# Patient Record
Sex: Female | Born: 1940 | Hispanic: No | Marital: Married | Smoking: Never smoker
Health system: Southern US, Community
[De-identification: ages and names within clinical notes are randomized; demographics above are authoritative.]

## PROBLEM LIST (undated history)

## (undated) DIAGNOSIS — M4300 Spondylolysis, site unspecified: Secondary | ICD-10-CM

## (undated) DIAGNOSIS — G6289 Other specified polyneuropathies: Secondary | ICD-10-CM

## (undated) HISTORY — DX: Other specified polyneuropathies: G62.89

## (undated) HISTORY — PX: COLONOSCOPY: SHX174

## (undated) HISTORY — DX: Spondylolysis, site unspecified: M43.00

## (undated) HISTORY — PX: OTHER SURGICAL HISTORY: SHX169

## (undated) HISTORY — PX: ABDOMINAL HYSTERECTOMY: SHX81

---

## 2001-04-15 DIAGNOSIS — J309 Allergic rhinitis, unspecified: Secondary | ICD-10-CM | POA: Insufficient documentation

## 2002-07-20 DIAGNOSIS — E7800 Pure hypercholesterolemia, unspecified: Secondary | ICD-10-CM | POA: Insufficient documentation

## 2015-05-26 HISTORY — PX: RADICAL VAGINAL HYSTERECTOMY: SUR662

## 2017-10-26 DIAGNOSIS — M79674 Pain in right toe(s): Secondary | ICD-10-CM | POA: Insufficient documentation

## 2017-10-26 DIAGNOSIS — L84 Corns and callosities: Secondary | ICD-10-CM | POA: Insufficient documentation

## 2019-04-26 ENCOUNTER — Other Ambulatory Visit: Payer: Self-pay | Admitting: Pain Medicine

## 2019-04-26 DIAGNOSIS — M5416 Radiculopathy, lumbar region: Secondary | ICD-10-CM

## 2019-05-04 ENCOUNTER — Ambulatory Visit
Admission: RE | Admit: 2019-05-04 | Discharge: 2019-05-04 | Disposition: A | Discharge status: Home / Self Care | Payer: Medicare Other | Source: Ambulatory Visit | Attending: Pain Medicine | Admitting: Pain Medicine

## 2019-05-04 ENCOUNTER — Other Ambulatory Visit: Payer: Self-pay

## 2019-05-04 DIAGNOSIS — M5416 Radiculopathy, lumbar region: Secondary | ICD-10-CM | POA: Insufficient documentation

## 2019-05-06 ENCOUNTER — Ambulatory Visit: Payer: Medicare Other

## 2019-05-11 ENCOUNTER — Ambulatory Visit: Payer: Self-pay

## 2019-06-13 DIAGNOSIS — K219 Gastro-esophageal reflux disease without esophagitis: Secondary | ICD-10-CM | POA: Insufficient documentation

## 2019-06-22 DIAGNOSIS — S22080A Wedge compression fracture of T11-T12 vertebra, initial encounter for closed fracture: Secondary | ICD-10-CM | POA: Insufficient documentation

## 2019-07-06 ENCOUNTER — Other Ambulatory Visit: Payer: Self-pay | Admitting: Orthopedic Surgery

## 2019-07-06 DIAGNOSIS — M4856XA Collapsed vertebra, not elsewhere classified, lumbar region, initial encounter for fracture: Secondary | ICD-10-CM

## 2019-07-06 DIAGNOSIS — M4310 Spondylolisthesis, site unspecified: Secondary | ICD-10-CM

## 2019-07-06 DIAGNOSIS — M48061 Spinal stenosis, lumbar region without neurogenic claudication: Secondary | ICD-10-CM

## 2019-07-24 ENCOUNTER — Ambulatory Visit
Admission: RE | Admit: 2019-07-24 | Discharge: 2019-07-24 | Disposition: A | Discharge status: Home / Self Care | Payer: Medicare Other | Source: Ambulatory Visit | Attending: Orthopedic Surgery | Admitting: Orthopedic Surgery

## 2019-07-24 DIAGNOSIS — M48061 Spinal stenosis, lumbar region without neurogenic claudication: Secondary | ICD-10-CM

## 2019-07-24 DIAGNOSIS — M4856XA Collapsed vertebra, not elsewhere classified, lumbar region, initial encounter for fracture: Secondary | ICD-10-CM | POA: Insufficient documentation

## 2019-07-24 DIAGNOSIS — M4310 Spondylolisthesis, site unspecified: Secondary | ICD-10-CM | POA: Diagnosis present

## 2020-01-11 ENCOUNTER — Other Ambulatory Visit: Payer: Self-pay | Admitting: Student

## 2020-01-11 DIAGNOSIS — K225 Diverticulum of esophagus, acquired: Secondary | ICD-10-CM

## 2020-01-11 DIAGNOSIS — R1313 Dysphagia, pharyngeal phase: Secondary | ICD-10-CM

## 2020-01-17 ENCOUNTER — Ambulatory Visit
Admission: RE | Admit: 2020-01-17 | Discharge: 2020-01-17 | Disposition: A | Discharge status: Home / Self Care | Payer: Medicare Other | Source: Ambulatory Visit | Attending: Student | Admitting: Student

## 2020-01-17 ENCOUNTER — Other Ambulatory Visit: Payer: Self-pay

## 2020-01-17 ENCOUNTER — Other Ambulatory Visit: Payer: Self-pay | Admitting: Student

## 2020-01-17 DIAGNOSIS — R1313 Dysphagia, pharyngeal phase: Secondary | ICD-10-CM | POA: Insufficient documentation

## 2020-01-17 DIAGNOSIS — K225 Diverticulum of esophagus, acquired: Secondary | ICD-10-CM | POA: Diagnosis present

## 2020-07-24 ENCOUNTER — Other Ambulatory Visit: Payer: Self-pay | Admitting: Family Medicine

## 2020-07-24 DIAGNOSIS — Z1231 Encounter for screening mammogram for malignant neoplasm of breast: Secondary | ICD-10-CM

## 2020-08-13 ENCOUNTER — Ambulatory Visit
Admission: RE | Admit: 2020-08-13 | Discharge: 2020-08-13 | Disposition: A | Discharge status: Home / Self Care | Payer: Medicare Other | Source: Ambulatory Visit | Attending: Family Medicine | Admitting: Family Medicine

## 2020-08-13 ENCOUNTER — Other Ambulatory Visit: Payer: Self-pay

## 2020-08-13 DIAGNOSIS — Z1231 Encounter for screening mammogram for malignant neoplasm of breast: Secondary | ICD-10-CM | POA: Diagnosis not present

## 2020-11-26 ENCOUNTER — Other Ambulatory Visit: Payer: Self-pay

## 2020-11-26 ENCOUNTER — Encounter: Payer: Self-pay | Admitting: Student in an Organized Health Care Education/Training Program

## 2020-11-26 ENCOUNTER — Ambulatory Visit
Payer: Medicare Other | Attending: Student in an Organized Health Care Education/Training Program | Admitting: Student in an Organized Health Care Education/Training Program

## 2020-11-26 VITALS — BP 146/81 | HR 94 | Temp 97.0°F | Resp 14 | Ht 63.0 in | Wt 112.0 lb

## 2020-11-26 DIAGNOSIS — M48061 Spinal stenosis, lumbar region without neurogenic claudication: Secondary | ICD-10-CM | POA: Diagnosis present

## 2020-11-26 DIAGNOSIS — M51369 Other intervertebral disc degeneration, lumbar region without mention of lumbar back pain or lower extremity pain: Secondary | ICD-10-CM | POA: Insufficient documentation

## 2020-11-26 DIAGNOSIS — M5136 Other intervertebral disc degeneration, lumbar region: Secondary | ICD-10-CM | POA: Diagnosis not present

## 2020-11-26 DIAGNOSIS — M5416 Radiculopathy, lumbar region: Secondary | ICD-10-CM | POA: Diagnosis not present

## 2020-11-26 DIAGNOSIS — M47816 Spondylosis without myelopathy or radiculopathy, lumbar region: Secondary | ICD-10-CM | POA: Insufficient documentation

## 2020-11-26 DIAGNOSIS — G588 Other specified mononeuropathies: Secondary | ICD-10-CM | POA: Diagnosis present

## 2020-11-26 DIAGNOSIS — G8929 Other chronic pain: Secondary | ICD-10-CM | POA: Diagnosis present

## 2020-11-26 DIAGNOSIS — Z79891 Long term (current) use of opiate analgesic: Secondary | ICD-10-CM | POA: Diagnosis present

## 2020-11-26 DIAGNOSIS — M48062 Spinal stenosis, lumbar region with neurogenic claudication: Secondary | ICD-10-CM | POA: Diagnosis present

## 2020-11-26 DIAGNOSIS — G629 Polyneuropathy, unspecified: Secondary | ICD-10-CM | POA: Diagnosis present

## 2020-11-26 DIAGNOSIS — G894 Chronic pain syndrome: Secondary | ICD-10-CM | POA: Diagnosis not present

## 2020-11-26 DIAGNOSIS — G5703 Lesion of sciatic nerve, bilateral lower limbs: Secondary | ICD-10-CM | POA: Insufficient documentation

## 2020-11-26 DIAGNOSIS — Z79899 Other long term (current) drug therapy: Secondary | ICD-10-CM | POA: Insufficient documentation

## 2020-11-26 NOTE — Progress Notes (Signed)
Safety precautions to be maintained throughout the outpatient stay will include: orient to surroundings, keep bed in low position, maintain call bell within reach at all times, provide assistance with transfer out of bed and ambulation.  

## 2020-11-26 NOTE — Progress Notes (Signed)
Patient: Sheila Newton  Service Category: E/M  Provider: Edward Jolly, MD  DOB: [birth year]  DOS: 11/26/2020  Referring Provider: Kandyce Rud, MD  MRN: [id number]  Setting: Ambulatory outpatient  PCP: Kandyce Rud, MD  Type: New Patient  Specialty: Interventional Pain Management    Location: Office  Delivery: Face-to-face     Primary Reason(s) for Visit: Encounter for initial evaluation of one or more chronic problems (new to examiner) potentially causing chronic pain, and posing a threat to normal musculoskeletal function. (Level of risk: High) CC: Pain (Small fiber nerve neuropathy)  HPI  Sheila Newton is a 80 y.o. year old, female patient, who comes for the first time to our practice referred by Kandyce Rud, MD for our initial evaluation of her chronic pain. She has Chronic radicular lumbar pain; Lumbar spondylosis; Lumbar degenerative disc disease; Pudendal neuralgia; Encounter for long-term opiate analgesic use; Spinal stenosis, lumbar region, with neurogenic claudication; Chronic pain syndrome; Small fiber neuropathy; Piriformis syndrome of both sides; and Neuroforaminal stenosis of lumbar spine (L5/S1, severe bilateral) on their problem list. Today she comes in for evaluation of her Pain (Small fiber nerve neuropathy)  Pain Assessment: Location:   Other (Comment) Radiating:   Onset: More than a month ago Duration: Chronic pain Quality: Burning, Stabbing Severity: 3 /10 (subjective, self-reported pain score)  Effect on ADL: difficulty performing daily activities Timing: Intermittent Modifying factors: standing, medications BP: (!) 146/81  HR: 94  Onset and Duration: Gradual and Present longer than 3 months Cause of pain: Unknown Severity: No change since onset, NAS-11 at its worse: 8/10, NAS-11 at its best: 2/10, NAS-11 now: 2/10, and NAS-11 on the average: 3/10 Timing: During activity or exercise and After activity or exercise Aggravating Factors: Bowel movements and Prolonged  sitting Alleviating Factors: Lying down, Resting, Sleeping, and Standing Associated Problems: Constipation and Pain that wakes patient up Quality of Pain: Aching, Burning, Constant, Intermittent, Distressing, Feeling of constriction, Horrible, Pressure-like, Sharp, Shooting, Stabbing, Tender, Tingling, Tiring, and Uncomfortable Previous Examinations or Tests: MRI scan, Nerve block, X-rays, Nerve conduction test, Neurological evaluation, and Orthopedic evaluation Previous Treatments: Narcotic medications, Physical Therapy, and Stretching exercises  Sheila Newton is a very pleasant 80 year old female with a history of lumbar facet arthropathy, lumbar spondylosis, pudendal neuralgia, small fiber neuropathy, spinal stenosis with neurogenic claudication, piriformis syndrome who presents for chronic pain management.  She was previously established with pain management in [facility].  She moved to [place] approximately 2 years ago.  She has been seeing Dr. Marca Ancona at [facility].  She would like to transfer her pain management here as this is closer to her endograft [place] every 2 months is too long.  She is currently managed on Butrans patch 20 mcg an hour, tramadol 50 mg twice daily as needed for breakthrough pain, tramadol ER 100 mg nightly.  She has had multiple interventional procedures including lumbar epidural steroid injections, sacral lateral branch nerve blocks, as well as pudendal nerve blocks with limited response.  She has read about spinal cord stimulation but states that she is not ready to pursue this yet.  She would like to have her Butrans patch, tramadol continued at this clinic.  No issues with medication compliance or intake, or overutilization or diversion.  Of note I reviewed Dr. Kizzie Ide last note.  Historic Controlled Substance Pharmacotherapy Review   Current opioid analgesics: Butrans patch 20 mcg an hour, tramadol 50 mg twice daily, tramadol ER 100 mg  nightly  Historical Monitoring: The patient  reports no  history of drug use. List of all UDS Test(s): No results found for: MDMA, COCAINSCRNUR, PCPSCRNUR, PCPQUANT, CANNABQUANT, THCU, ETH List of other Serum/Urine Drug Screening Test(s):  No results found for: AMPHSCRSER, BARBSCRSER, BENZOSCRSER, COCAINSCRSER, COCAINSCRNUR, PCPSCRSER, PCPQUANT, THCSCRSER, THCU, CANNABQUANT, OPIATESCRSER, OXYSCRSER, PROPOXSCRSER, ETH Historical Background Evaluation: [place] PMP: PDMP reviewed during this encounter. Online review of the past 55-month period conducted.               Department of public safety, offender search: Engineer, mining Information) Non-contributory Risk Assessment Profile: Aberrant behavior: None observed or detected today Risk factors for fatal opioid overdose: None identified today  Fatal overdose hazard ratio (HR): Calculation deferred Non-fatal overdose hazard ratio (HR): Calculation deferred Risk of opioid abuse or dependence: 0.7-3.0% with doses ? 36 MME/day and 6.1-26% with doses ? 120 MME/day. Substance use disorder (SUD) risk level: See below Personal History of Substance Abuse (SUD-Substance use disorder):  Alcohol: Negative  Illegal Drugs: Negative  Rx Drugs: Negative  ORT Risk Level calculation: Low Risk  Opioid Risk Tool - 11/26/20 1422       Family History of Substance Abuse   Alcohol Negative    Illegal Drugs Negative    Rx Drugs Negative      Personal History of Substance Abuse   Alcohol Negative    Illegal Drugs Negative    Rx Drugs Negative      Age   Age between 61-45 years  No      Psychological Disease   Psychological Disease Negative    Depression Negative      Total Score   Opioid Risk Tool Scoring 0    Opioid Risk Interpretation Low Risk            ORT Scoring interpretation table:  Score <3 = Low Risk for SUD  Score between 4-7 = Moderate Risk for SUD  Score >8 = High Risk for Opioid Abuse   PHQ-2 Depression Scale:  Total score:  0  PHQ-2 Scoring interpretation table: (Score and probability of major depressive disorder)  Score 0 = No depression  Score 1 = 15.4% Probability  Score 2 = 21.1% Probability  Score 3 = 38.4% Probability  Score 4 = 45.5% Probability  Score 5 = 56.4% Probability  Score 6 = 78.6% Probability   PHQ-9 Depression Scale:  Total score: 0  PHQ-9 Scoring interpretation table:  Score 0-4 = No depression  Score 5-9 = Mild depression  Score 10-14 = Moderate depression  Score 15-19 = Moderately severe depression  Score 20-27 = Severe depression (2.4 times higher risk of SUD and 2.89 times higher risk of overuse)   Pharmacologic Plan: As per protocol, I have not taken over any controlled substance management, pending the results of ordered tests and/or consults.            Initial impression: Pending review of available data and ordered tests.  Meds   Current Outpatient Medications:    ASPIRIN 81 PO, Take by mouth daily., Disp: , Rfl:    atorvastatin (LIPITOR) 20 MG tablet, Take by mouth daily., Disp: , Rfl:    buprenorphine (BUTRANS) 20 MCG/HR PTWK, Place 1 patch onto the skin., Disp: , Rfl:    Coenzyme Q10-Vitamin E (QUNOL ULTRA COQ10 PO), Take by mouth daily., Disp: , Rfl:    Cyanocobalamin (VITAMIN B 12 PO), Take by mouth daily., Disp: , Rfl:    estradiol (VIVELLE-DOT) 0.025 MG/24HR, Place 1 patch onto the skin every 7 (seven) days., Disp: , Rfl:  lubiprostone (AMITIZA) 8 MCG capsule, Take 8 mcg by mouth 2 (two) times daily as needed for constipation., Disp: , Rfl:    Magnesium 200 MG CHEW, Chew by mouth daily., Disp: , Rfl:    Multiple Vitamins-Minerals (MULTIVITAMIN ADULT) CHEW, Chew by mouth daily., Disp: , Rfl:    pantoprazole (PROTONIX) 20 MG tablet, Take 20 mg by mouth daily., Disp: , Rfl:    Pyridoxine HCl (VITAMIN B-6 PO), Take by mouth daily., Disp: , Rfl:    traMADol (ULTRAM) 50 MG tablet, Take 50 mg by mouth 2 (two) times daily., Disp: , Rfl:    traMADol (ULTRAM-ER) 100 MG 24  hr tablet, Take 100 mg by mouth at bedtime., Disp: , Rfl:   Imaging Review   Lumbosacral Imaging: Lumbar MR wo contrast: Results for orders placed during the hospital encounter of 05/04/19  MR LUMBAR SPINE WO CONTRAST  Narrative CLINICAL DATA:  Lower pelvis and groin pain for 5-7 years  EXAM: MRI LUMBAR SPINE WITHOUT CONTRAST  TECHNIQUE: Multiplanar, multisequence MR imaging of the lumbar spine was performed. No intravenous contrast was administered.  COMPARISON:  None.  FINDINGS: Segmentation: There are 5 non-rib bearing lumbar type vertebral bodies with the last intervertebral disc space labeled as L5-S1.  Alignment: There is a grade 1 anterolisthesis of L4 on L5 measuring approximately 3 mm. There is also a grade 1 anterolisthesis of L5 on S1 measuring 6 mm.  Vertebrae: There is a chronic anterior wedge compression deformity of the T12 vertebral body with approximately 50% loss in vertebral body height. No retropulsion of fragments is seen. There is buckling of the superior cortex of T12. 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 Endplate STIR signal changes are seen at L5-S1. No acute fracture, marrow edema,or pathologic marrow infiltration.  Conus medullaris and cauda equina: Conus extends to the L1-2 level. Conus and cauda equina appear normal.  Paraspinal and other soft tissues: The paraspinal soft tissues and visualized retroperitoneal structures are unremarkable. The sacroiliac joints are intact.  Disc levels:  T12-L1: There is a broad-based disc bulge with facet arthrosis which causes mild bilateral neural foraminal narrowing.  L1-L2: There is a broad-based disc bulge with facet arthrosis which causes mild bilateral neural foraminal narrowing.  L2-L3: There is a broad-based disc bulge with facet arthrosis which causes mild-to-moderate bilateral neural foraminal narrowing. There is mild effacement anterior thecal sac.  L3-L4: There is a  broad-based disc bulge with ligamentum flavum hypertrophy and facet arthrosis which causes moderate bilateral neural foraminal narrowing. There is mild central canal narrowing.  L4-L5: There is a broad-based disc bulge with a central disc protrusion, facet arthrosis and ligamentum flavum hypertrophy. There is severe bilateral neural foraminal narrowing. The central thecal sac is effaced measuring 4 mm in AP diameter.  L5-S1: There is disc uncovering with a broad-based disc bulge and facet arthrosis. Severe bilateral neural foraminal narrowing is seen. There is mild central canal stenosis.  IMPRESSION: 1. Grade 1 anterolisthesis of L4 on L5 and L5 on S1. 2. Findings suggestive of chronic bilateral pars defects at L5-S1 with Modic type endplate changes. 3. Lumbar spine spondylosis most notable at L4-L5 with severe bilateral neural foraminal narrowing and moderate-severe central canal stenosis and at L5-S1 with severe bilateral neural foraminal narrowing and mild central canal stenosis.   Electronically Signed By: Jonna Clark M.D. On: 05/04/2019 13:52  ROS  Cardiovascular: Daily Aspirin intake Pulmonary or Respiratory: Shortness of breath Neurological: Abnormal skin sensations (Peripheral Neuropathy) Psychological-Psychiatric: No reported psychological or  psychiatric signs or symptoms such as difficulty sleeping, anxiety, depression, delusions or hallucinations (schizophrenial), mood swings (bipolar disorders) or suicidal ideations or attempts Gastrointestinal: Reflux or heatburn Genitourinary: No reported renal or genitourinary signs or symptoms such as difficulty voiding or producing urine, peeing blood, non-functioning kidney, kidney stones, difficulty emptying the bladder, difficulty controlling the flow of urine, or chronic kidney disease Hematological: No reported hematological signs or symptoms such as prolonged bleeding, low or poor functioning platelets, bruising or bleeding  easily, hereditary bleeding problems, low energy levels due to low hemoglobin or being anemic Endocrine: No reported endocrine signs or symptoms such as high or low blood sugar, rapid heart rate due to high thyroid levels, obesity or weight gain due to slow thyroid or thyroid disease Rheumatologic: Generalized muscle aches (Fibromyalgia) Musculoskeletal: Negative for myasthenia gravis, muscular dystrophy, multiple sclerosis or malignant hyperthermia Work History: Retired  Allergies  Sheila Newton is allergic to ilosone [erythromycin].   PFSH  Drug: Sheila Newton  reports no history of drug use. Alcohol:  reports no history of alcohol use. Tobacco:  reports that she has never smoked. She has never been exposed to tobacco smoke. She does not have any smokeless tobacco history on file. Medical:  has a past medical history of Small fiber polyneuropathy and Spondylolysis. Family: family history is not on file.  Past Surgical History:  Procedure Laterality Date   ABDOMINAL HYSTERECTOMY     back pain     lumbar   Active Ambulatory Problems    Diagnosis Date Noted   Chronic radicular lumbar pain 11/26/2020   Lumbar spondylosis 11/26/2020   Lumbar degenerative disc disease 11/26/2020   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   Encounter for long-term opiate analgesic use 11/26/2020   Spinal stenosis, lumbar region, 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/26/2020   Chronic pain syndrome 11/26/2020   Small fiber neuropathy 11/26/2020   Piriformis syndrome of both sides 11/26/2020   Neuroforaminal stenosis of lumbar spine (L5/S1, severe bilateral) 11/26/2020   Resolved Ambulatory Problems    Diagnosis Date Noted   No Resolved Ambulatory Problems   Past Medical History:  Diagnosis Date   Small fiber polyneuropathy    Spondylolysis    Constitutional Exam  General appearance: Well nourished, well developed, and well hydrated. In no apparent acute distress Vitals:   11/26/20 1359  BP: (!) 146/81  Pulse: 94   Resp: 14  Temp: (!) 97 F (36.1 C)  TempSrc: Temporal  SpO2: 100%  Weight: 112 lb (50.8 kg)  Height:  (1.6 m)   BMI Assessment: Estimated body mass index is 19.84 kg/m as calculated from the following:   Height as of this encounter:  (1.6 m).   Weight as of this encounter: 112 lb (50.8 kg).  BMI interpretation table: BMI level Category Range association with higher incidence of chronic pain  <18 kg/m2 Underweight   18.5-24.9 kg/m2 Ideal body weight   25-29.9 kg/m2 Overweight Increased incidence by 20%  30-34.9 kg/m2 Obese (Class I) Increased incidence by 68%  35-39.9 kg/m2 Severe obesity (Class II) Increased incidence by 136%  >40 kg/m2 Extreme obesity (Class III) Increased incidence by 254%   Patient's current BMI Ideal Body weight  Body mass index is 19.84 kg/m. Ideal body weight: 52.4 kg (115 lb 8.3 oz)   BMI Readings from Last 4 Encounters:  11/26/20 19.84 kg/m   Wt Readings from Last 4 Encounters:  11/26/20 112 lb (50.8 kg)    Psych/Mental status: Alert, oriented x 3 (person, place, & time)  Eyes: PERLA Respiratory: No evidence of acute respiratory distress  Lumbar Spine Area Exam  Skin & Axial Inspection: No masses, redness, or swelling Alignment: Symmetrical Functional ROM: Pain restricted ROM       Stability: No instability detected Muscle Tone/Strength: Functionally intact. No obvious neuro-muscular anomalies detected. Sensory (Neurological): Dermatomal pain pattern Palpation: No palpable anomalies       Provocative Tests: Hyperextension/rotation test: deferred today       Lumbar quadrant test (Kemp's test): deferred today       Lateral bending test: deferred today       Patrick's Maneuver: deferred today                   FABER* test: (+) for bilateral S-I arthralgia             S-I anterior distraction/compression test: deferred today         S-I lateral compression test: (+)   S-I arthralgia/arthropathy S-I Thigh-thrust test: deferred  today         S-I Gaenslen's test: deferred today         *(Flexion, ABduction and External Rotation) Gait & Posture Assessment  Ambulation: Unassisted Gait: Relatively normal for age and body habitus Posture: WNL  Lower Extremity Exam    Side: Right lower extremity  Side: Left lower extremity  Stability: No instability observed          Stability: No instability observed          Skin & Extremity Inspection: Skin color, temperature, and hair growth are WNL. No peripheral edema or cyanosis. No masses, redness, swelling, asymmetry, or associated skin lesions. No contractures.  Skin & Extremity Inspection: Skin color, temperature, and hair growth are WNL. No peripheral edema or cyanosis. No masses, redness, swelling, asymmetry, or associated skin lesions. No contractures.  Functional ROM: Unrestricted ROM                  Functional ROM: Unrestricted ROM                  Muscle Tone/Strength: Functionally intact. No obvious neuro-muscular anomalies detected.  Muscle Tone/Strength: Functionally intact. No obvious neuro-muscular anomalies detected.  Sensory (Neurological): Neurogenic pain pattern        Sensory (Neurological): Neurogenic pain pattern        DTR: Patellar: deferred today Achilles: deferred today Plantar: deferred today  DTR: Patellar: deferred today Achilles: deferred today Plantar: deferred today  Palpation: No palpable anomalies  Palpation: No palpable anomalies    Assessment  Primary Diagnosis & Pertinent Problem List: The primary encounter diagnosis was Chronic radicular lumbar pain. Diagnoses of Neuroforaminal stenosis of lumbar spine (L5/S1, severe bilateral), Lumbar spondylosis (severe, L4/5), Lumbar degenerative disc disease, Pudendal neuralgia, Encounter for long-term opiate analgesic use, Spinal stenosis, lumbar region, with neurogenic claudication, Small fiber neuropathy, Piriformis syndrome of both sides, and Chronic pain syndrome were also pertinent to this  visit.  Visit Diagnosis (New problems to examiner): 1. Chronic radicular lumbar pain   2. Neuroforaminal stenosis of lumbar spine (L5/S1, severe bilateral)   3. Lumbar spondylosis (severe, L4/5)   4. Lumbar degenerative disc disease   5. Pudendal neuralgia   6. Encounter for long-term opiate analgesic use   7. Spinal stenosis, lumbar region, with neurogenic claudication   8. Small fiber neuropathy   9. Piriformis syndrome of both sides   10. Chronic pain syndrome    Plan of Care (Initial workup plan)  Note: Sheila Newton was reminded  that as per protocol, today's visit has been an evaluation only. We have not taken over the patient's controlled substance management.  General Recommendations: The pain condition that the patient suffers from is best treated with a multidisciplinary approach that involves an increase in physical activity to prevent de-conditioning and worsening of the pain cycle, as well as psychological counseling (formal and/or informal) to address the co-morbid psychological affects of pain. Treatment will often involve judicious use of pain medications and interventional procedures to decrease the pain, allowing the patient to participate in the physical activity that will ultimately produce long-lasting pain reductions. The goal of the multidisciplinary approach is to return the patient to a higher level of overall function and to restore their ability to perform activities of daily living.   1.  Urine toxicology screen which is customary for new patients be considered for chronic opioid therapy 2.  So long as UDS is appropriate, can take over patient's chronic opioid regimen of Butrans patch 20 mcg an hour, tramadol 50 mg 3 times daily as needed for breakthrough pain, tramadol ER 100 mg nightly.  She does find benefit with this regimen and it helps her function as well. 3.  We discussed spinal cord stimulation 4.  We also discussed piriformis injection for history of piriformis  syndrome.  We will discuss further when patient follows up for medication refill as patient was read about this further.   Lab Orders  Compliance Drug Analysis, Ur    Interventional management options: Sheila Newton was informed that there is no guarantee that she would be a candidate for interventional therapies. The decision will be based on the results of diagnostic studies, as well as Sheila Newton risk profile.  Procedure(s) under consideration:  Piriformis injection Sacroiliac joint injection Spinal cord stimulator trial    Provider-requested follow-up: Return in about 6 weeks (around 01/07/2021) for Medication Management, in person.  I spent a total of 60 minutes reviewing chart data, face-to-face evaluation with the patient, counseling and coordination of care as detailed above.   No future appointments.  Note by: Edward Jolly, MD Date: 11/26/2020; Time: 2:59 PM

## 2020-12-04 LAB — COMPLIANCE DRUG ANALYSIS, UR

## 2021-01-07 ENCOUNTER — Encounter: Payer: Self-pay | Admitting: Student in an Organized Health Care Education/Training Program

## 2021-01-07 ENCOUNTER — Ambulatory Visit
Payer: Medicare Other | Attending: Student in an Organized Health Care Education/Training Program | Admitting: Student in an Organized Health Care Education/Training Program

## 2021-01-07 ENCOUNTER — Other Ambulatory Visit: Payer: Self-pay

## 2021-01-07 VITALS — BP 136/69 | HR 87 | Temp 96.9°F | Resp 16 | Ht 63.0 in | Wt 112.0 lb

## 2021-01-07 DIAGNOSIS — M533 Sacrococcygeal disorders, not elsewhere classified: Secondary | ICD-10-CM | POA: Insufficient documentation

## 2021-01-07 DIAGNOSIS — G588 Other specified mononeuropathies: Secondary | ICD-10-CM | POA: Diagnosis present

## 2021-01-07 DIAGNOSIS — M5416 Radiculopathy, lumbar region: Secondary | ICD-10-CM | POA: Insufficient documentation

## 2021-01-07 DIAGNOSIS — G8929 Other chronic pain: Secondary | ICD-10-CM | POA: Insufficient documentation

## 2021-01-07 DIAGNOSIS — M48062 Spinal stenosis, lumbar region with neurogenic claudication: Secondary | ICD-10-CM | POA: Insufficient documentation

## 2021-01-07 DIAGNOSIS — G629 Polyneuropathy, unspecified: Secondary | ICD-10-CM | POA: Diagnosis present

## 2021-01-07 DIAGNOSIS — M47816 Spondylosis without myelopathy or radiculopathy, lumbar region: Secondary | ICD-10-CM | POA: Insufficient documentation

## 2021-01-07 DIAGNOSIS — Z79891 Long term (current) use of opiate analgesic: Secondary | ICD-10-CM

## 2021-01-07 DIAGNOSIS — Z0289 Encounter for other administrative examinations: Secondary | ICD-10-CM | POA: Insufficient documentation

## 2021-01-07 DIAGNOSIS — M47818 Spondylosis without myelopathy or radiculopathy, sacral and sacrococcygeal region: Secondary | ICD-10-CM | POA: Insufficient documentation

## 2021-01-07 DIAGNOSIS — M5136 Other intervertebral disc degeneration, lumbar region: Secondary | ICD-10-CM | POA: Diagnosis present

## 2021-01-07 DIAGNOSIS — G5703 Lesion of sciatic nerve, bilateral lower limbs: Secondary | ICD-10-CM | POA: Insufficient documentation

## 2021-01-07 DIAGNOSIS — M48061 Spinal stenosis, lumbar region without neurogenic claudication: Secondary | ICD-10-CM | POA: Insufficient documentation

## 2021-01-07 DIAGNOSIS — G894 Chronic pain syndrome: Secondary | ICD-10-CM | POA: Diagnosis present

## 2021-01-07 MED ORDER — TRAMADOL HCL ER 100 MG PO TB24
100.0000 mg | ORAL_TABLET | Freq: Every day | ORAL | 1 refills | Status: DC
Start: 2021-01-25 — End: 2021-03-18

## 2021-01-07 MED ORDER — BUPRENORPHINE 20 MCG/HR TD PTWK
1.0000 | MEDICATED_PATCH | TRANSDERMAL | 1 refills | Status: DC
Start: 2021-01-31 — End: 2021-03-18

## 2021-01-07 MED ORDER — TRAMADOL HCL 50 MG PO TABS
50.0000 mg | ORAL_TABLET | Freq: Two times a day (BID) | ORAL | 1 refills | Status: DC
Start: 2021-01-18 — End: 2021-03-18

## 2021-01-07 NOTE — Progress Notes (Signed)
PROVIDER NOTE: Information contained herein reflects review and annotations entered in association with encounter. Interpretation of such information and data should be left to medically-trained personnel. Information provided to patient can be located elsewhere in the medical record under "Patient Instructions". Document created using STT-dictation technology, any transcriptional errors that may result from process are unintentional.    Patient: Sheila Newton  Service Category: E/M  Provider: Gillis Santa, MD  DOB: 08/17/1940  DOS: 01/07/2021  Specialty: Interventional Pain Management  MRN: [id number]  Setting: Ambulatory outpatient  PCP: Derinda Late, MD  Type: Established Patient    Referring Provider: Derinda Late, MD  Location: Office  Delivery: Face-to-face     Primary Reason(s) for Visit: Encounter for evaluation before starting new chronic pain management plan of care (Level of risk: moderate) CC: Peripheral Neuropathy (Primary - perineum)  HPI  Sheila Newton is a 80 y.o. year old, female patient, who comes today for a follow-up evaluation to review the test results and decide on a treatment plan. She has Chronic radicular lumbar pain; Lumbar spondylosis; Lumbar degenerative disc disease; Pudendal neuralgia; Encounter for long-term opiate analgesic use; Spinal stenosis, lumbar region, with neurogenic claudication; Chronic pain syndrome; Small fiber neuropathy; Piriformis syndrome of both sides; Neuroforaminal stenosis of lumbar spine (L5/S1, severe bilateral); Pain management contract signed; Sacroiliac joint pain; and SI joint arthritis on their problem list. Her primarily concern today is the Peripheral Neuropathy (Primary - perineum)  Pain Assessment: Location:    (perineum) Radiating:   Onset: More than a month ago Duration: Chronic pain Quality: Burning (descriptors depend on how long patient has been sitting) Severity: 2 /10 (subjective, self-reported pain score)  Effect on ADL:  difficulty performing daily activities Timing: Constant Modifying factors: standing, meds BP: 136/69  HR: 87  Sheila Newton comes in today for a follow-up visit after her initial evaluation on 11/26/2020.   And presents today for her second patient visit.  Today I will be taking over her pain management that she will be signing her pain contract here.  I will take over her chronic pain regimen as below which is the same regimen that she was on at [facility] with Dr. Darral Dash.  Today we discussed diagnostic bilateral piriformis injection and sacroiliac joint injection as we had discussed at her initial clinic visit.  Patient has buttock pain, lower back pain, hip pain.  She has done physical therapy in the past and continues to do exercises and stretches at home regularly.   HPI from initial clinic note 11/26/20: Sheila Newton is a very pleasant 80 year old female with a history of lumbar facet arthropathy, lumbar spondylosis, pudendal neuralgia, small fiber neuropathy, spinal stenosis with neurogenic claudication, piriformis syndrome who presents for chronic pain management.  She was previously established with pain management in [facility].  She moved to [place] approximately 2 years ago.  She has been seeing Dr. Darral Dash at [facility].  She would like to transfer her pain management here as this is closer to her endograft [place] every 2 months is too long.  She is currently managed on Butrans patch 20 mcg an hour, tramadol 50 mg twice daily as needed for breakthrough pain, tramadol ER 100 mg nightly.  She has had multiple interventional procedures including lumbar epidural steroid injections, sacral lateral branch nerve blocks, as well as pudendal nerve blocks with limited response.  She has read about spinal cord stimulation but states that she is not ready to pursue this yet.  She would like to  have her Butrans patch, tramadol continued at this clinic.  No issues with  medication compliance or intake, or overutilization or diversion.   Assessment and plan:  1.  Urine toxicology screen which is customary for new patients be considered for chronic opioid therapy 2.  So long as UDS is appropriate, can take over patient's chronic opioid regimen of Butrans patch 20 mcg an hour, tramadol 50 mg 3 times daily as needed for breakthrough pain, tramadol ER 100 mg nightly.  She does find benefit with this regimen and it helps her function as well. 3.  We discussed spinal cord stimulation 4.  We also discussed piriformis injection for history of piriformis syndrome.  We will discuss further when patient follows up for medication refill as patient was read about this further.  Controlled Substance Pharmacotherapy Assessment REMS (Risk Evaluation and Mitigation Strategy)  Analgesic: Butrans patch 20 mcg an hour, tramadol 50 mg twice daily as needed for breakthrough pain, tramadol ER 100 mg nightly  Pill Count: None expected due to no prior prescriptions written by our practice. [name], RN  01/07/2021 10:30 AM  Sign when Signing Visit Safety precautions to be maintained throughout the outpatient stay will include: orient to surroundings, keep bed in low position, maintain call bell within reach at all times, provide assistance with transfer out of bed and ambulation.      Pharmacokinetics: Liberation and absorption (onset of action): WNL Distribution (time to peak effect): WNL Metabolism and excretion (duration of action): WNL         Pharmacodynamics: Desired effects: Analgesia: Sheila Newton reports >50% benefit. Functional ability: Patient reports that medication allows her to accomplish basic ADLs Clinically meaningful improvement in function (CMIF): Sustained CMIF goals met Perceived effectiveness: Described as relatively effective, allowing for increase in activities of daily living (ADL) Undesirable effects: Side-effects or Adverse reactions: None  reported Monitoring: [place] PMP: PDMP not reviewed this encounter. Online review of the past 58-monthperiod previously conducted. Not applicable at this point since we have not taken over the patient's medication management yet. List of other Serum/Urine Drug Screening Test(s):  No results found for: AMPHSCRSER, BARBSCRSER, BENZOSCRSER, COCAINSCRSER, COCAINSCRNUR, PCPSCRSER, THCSCRSER, THCU, CANNABQUANT, [place] E[place]ford[place] of all UDS test(s) done:  Lab Results  Component Value Date   SUMMARY Note 11/26/2020   Last UDS on record: Summary  Date Value Ref Range Status  11/26/2020 Note  Final    Comment:    ==================================================================== Compliance Drug Analysis, Ur ==================================================================== Test                             Result       Flag       Units  Drug Present and Declared for Prescription Verification   Buprenorphine                  23           EXPECTED   ng/mg creat   Norbuprenorphine               22           EXPECTED   ng/mg creat    Source of buprenorphine is a scheduled prescription medication.    Norbuprenorphine is an expected metabolite of buprenorphine.    Tramadol                       >3165  EXPECTED   ng/mg creat   O-Desmethyltramadol            >3165        EXPECTED   ng/mg creat   N-Desmethyltramadol            >3165        EXPECTED   ng/mg creat    Source of tramadol is a prescription medication. O-desmethyltramadol    and N-desmethyltramadol are expected metabolites of tramadol.  Drug Absent but Declared for Prescription Verification   Salicylate                     Not Detected UNEXPECTED    Aspirin, as indicated in the declared medication list, is not always    detected even when used as directed.  ==================================================================== Test                      Result    Flag   Units      Ref Range   Creatinine               158              mg/dL      >=20 ==================================================================== Declared Medications:  The flagging and interpretation on this report are based on the  following declared medications.  Unexpected results may arise from  inaccuracies in the declared medications.   **Note: The testing scope of this panel includes these medications:   Tramadol   **Note: The testing scope of this panel does not include small to  moderate amounts of these reported medications:   Aspirin  Buprenorphine Patch (BuTrans)   **Note: The testing scope of this panel does not include the  following reported medications:   Atorvastatin  Cyanocobalamin  Estradiol  Lubiprostone  Magnesium  Multivitamin  Pantoprazole  Pyridoxine  Ubiquinone (CoQ10)  Vitamin E ==================================================================== For clinical consultation, please call [phone]. ====================================================================    UDS interpretation: No unexpected findings.          Medication Assessment Form: Patient introduced to form today Treatment compliance: Treatment may start today if patient agrees with proposed plan. Evaluation of compliance is not applicable at this point Risk Assessment Profile: Aberrant behavior: See initial evaluations. None observed or detected today Comorbid factors increasing risk of overdose: See initial evaluation. No additional risks detected today Opioid risk tool (ORT):  Opioid Risk  01/07/2021  Alcohol 0  Illegal Drugs 0  Rx Drugs 0  Alcohol 0  Illegal Drugs 0  Rx Drugs 0  Age between 16-45 years  0  Psychological Disease 0  Depression 0  Opioid Risk Tool Scoring 0  Opioid Risk Interpretation Low Risk    ORT Scoring interpretation table:  Score <3 = Low Risk for SUD  Score between 4-7 = Moderate Risk for SUD  Score >8 = High Risk for Opioid Abuse   Risk of substance use disorder  (SUD): Low  Risk Mitigation Strategies:  Patient opioid safety counseling: Completed today. Counseling provided to patient as per "Patient Counseling Document". Document signed by patient, attesting to counseling and understanding Patient-Prescriber Agreement (PPA): Obtained today.  Controlled substance notification to other providers: Written and sent today.  Pharmacologic Plan: Today we may be taking over the patient's pharmacological regimen. See below.             Laboratory Chemistry Profile   Renal No results found for: BUN, CREATININE, LABCREA, BCR, GFR, GFRAA, GFRNONAA, SPECGRAV,  PHUR, PROTEINUR   Electrolytes No results found for: NA, K, CL, CALCIUM, MG, PHOS   Hepatic No results found for: AST, ALT, ALBUMIN, ALKPHOS, AMYLASE, LIPASE, AMMONIA   ID No results found for: LYMEIGGIGMAB, HIV, SARSCOV2NAA, STAPHAUREUS, MRSAPCR, HCVAB, PREGTESTUR, RMSFIGG, QFVRPH1IGG, QFVRPH2IGG, LYMEIGGIGMAB   Bone No results found for: VD25OH, [id number], DX4128NO6, VE7209OB0, 25OHVITD1, 25OHVITD2, 25OHVITD3, TESTOFREE, TESTOSTERONE   Endocrine No results found for: GLUCOSE, GLUCOSEU, HGBA1C, TSH, FREET4, TESTOFREE, TESTOSTERONE, SHBG, ESTRADIOL, ESTRADIOLPCT, ESTRADIOLFRE, LABPREG, ACTH, CRTSLPL, UCORFRPERLTR, UCORFRPERDAY, CORTISOLBASE, LABPREG   Neuropathy No results found for: VITAMINB12, FOLATE, HGBA1C, HIV   CNS No results found for: COLORCSF, APPEARCSF, RBCCOUNTCSF, WBCCSF, POLYSCSF, LYMPHSCSF, EOSCSF, PROTEINCSF, GLUCCSF, JCVIRUS, CSFOLI, IGGCSF, LABACHR, ACETBL, LABACHR, ACETBL   Inflammation (CRP: Acute  ESR: Chronic) No results found for: CRP, ESRSEDRATE, LATICACIDVEN   Rheumatology No results found for: RF, ANA, LABURIC, URICUR, LYMEIGGIGMAB, LYMEABIGMQN, HLAB27   Coagulation No results found for: INR, LABPROT, APTT, PLT, DDIMER, LABHEMA, VITAMINK1, AT3   Cardiovascular No results found for: BNP, CKTOTAL, CKMB, TROPONINI, HGB, HCT, LABVMA, EPIRU, EPINEPH24HUR, NOREPRU,  NOREPI24HUR, DOPARU, DOPAM24HRUR   Screening No results found for: SARSCOV2NAA, COVIDSOURCE, STAPHAUREUS, MRSAPCR, HCVAB, HIV, PREGTESTUR   Cancer No results found for: CEA, CA125, LABCA2   Allergens No results found for: ALMOND, APPLE, ASPARAGUS, AVOCADO, BANANA, BARLEY, BASIL, BAYLEAF, GREENBEAN, LIMABEAN, WHITEBEAN, BEEFIGE, REDBEET, BLUEBERRY, BROCCOLI, CABBAGE, MELON, CARROT, CASEIN, CASHEWNUT, CAULIFLOWER, CELERY     Note: Lab results reviewed.  Recent Diagnostic Imaging Review  Lumbosacral Imaging: Lumbar MR wo contrast: Results for orders placed during the hospital encounter of 05/04/19  MR LUMBAR SPINE WO CONTRAST  Narrative CLINICAL DATA:  Lower pelvis and groin pain for 5-7 years  EXAM: MRI LUMBAR SPINE WITHOUT CONTRAST  TECHNIQUE: Multiplanar, multisequence MR imaging of the lumbar spine was performed. No intravenous contrast was administered.  COMPARISON:  None.  FINDINGS: Segmentation: There are 5 non-rib bearing lumbar type vertebral bodies with the last intervertebral disc space labeled as L5-S1.  Alignment: There is a grade 1 anterolisthesis of L4 on L5 measuring approximately 3 mm. There is also a grade 1 anterolisthesis of L5 on S1 measuring 6 mm.  Vertebrae: There is a chronic anterior wedge compression deformity of the T12 vertebral body with approximately 50% loss in vertebral body height. No retropulsion of fragments is seen. There is buckling of the superior cortex of T12. 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 Endplate STIR signal changes are seen at L5-S1. No acute fracture, marrow edema,or pathologic marrow infiltration.  Conus medullaris and cauda equina: Conus extends to the L1-2 level. Conus and cauda equina appear normal.  Paraspinal and other soft tissues: The paraspinal soft tissues and visualized retroperitoneal structures are unremarkable. The sacroiliac joints are intact.  Disc levels:  T12-L1: There is a  broad-based disc bulge with facet arthrosis which causes mild bilateral neural foraminal narrowing.  L1-L2: There is a broad-based disc bulge with facet arthrosis which causes mild bilateral neural foraminal narrowing.  L2-L3: There is a broad-based disc bulge with facet arthrosis which causes mild-to-moderate bilateral neural foraminal narrowing. There is mild effacement anterior thecal sac.  L3-L4: There is a broad-based disc bulge with ligamentum flavum hypertrophy and facet arthrosis which causes moderate bilateral neural foraminal narrowing. There is mild central canal narrowing.  L4-L5: There is a broad-based disc bulge with a central disc protrusion, facet arthrosis and ligamentum flavum hypertrophy. There is severe bilateral neural foraminal narrowing. The central thecal sac is effaced measuring 4 mm in AP diameter.  L5-S1: There is disc uncovering with a broad-based disc bulge and facet arthrosis. Severe bilateral neural foraminal narrowing is seen. There is mild central canal stenosis.  IMPRESSION: 1. Grade 1 anterolisthesis of L4 on L5 and L5 on S1. 2. Findings suggestive of chronic bilateral pars defects at L5-S1 with Modic type endplate changes. 3. Lumbar spine spondylosis most notable at L4-L5 with severe bilateral neural foraminal narrowing and moderate-severe central canal stenosis and at L5-S1 with severe bilateral neural foraminal narrowing and mild central canal stenosis.   Electronically Signed By: Prudencio Pair M.D. On: 05/04/2019 13:52   Complexity Note: Imaging results reviewed. Results shared with Ms. Turi, using Layman's terms.                         Meds   Current Outpatient Medications:    ASPIRIN 81 PO, Take by mouth daily., Disp: , Rfl:    atorvastatin (LIPITOR) 20 MG tablet, Take by mouth daily., Disp: , Rfl:    Coenzyme Q10-Vitamin E (QUNOL ULTRA COQ10 PO), Take by mouth daily., Disp: , Rfl:    Cyanocobalamin (VITAMIN B 12 PO), Take by  mouth daily., Disp: , Rfl:    estradiol (VIVELLE-DOT) 0.025 MG/24HR, Place 1 patch onto the skin every 7 (seven) days., Disp: , Rfl:    lubiprostone (AMITIZA) 8 MCG capsule, Take 8 mcg by mouth 2 (two) times daily as needed for constipation., Disp: , Rfl:    Magnesium 200 MG CHEW, Chew by mouth daily., Disp: , Rfl:    Multiple Vitamins-Minerals (MULTIVITAMIN ADULT) CHEW, Chew by mouth daily., Disp: , Rfl:    pantoprazole (PROTONIX) 20 MG tablet, Take 20 mg by mouth daily., Disp: , Rfl:    Pyridoxine HCl (VITAMIN B-6 PO), Take by mouth daily., Disp: , Rfl:    [START ON 01/31/2021] buprenorphine (BUTRANS) 20 MCG/HR PTWK, Place 1 patch onto the skin once a week., Disp: 4 patch, Rfl: 1   [START ON 01/18/2021] traMADol (ULTRAM) 50 MG tablet, Take 1 tablet (50 mg total) by mouth 2 (two) times daily., Disp: 60 tablet, Rfl: 1   [START ON 01/25/2021] traMADol (ULTRAM-ER) 100 MG 24 hr tablet, Take 1 tablet (100 mg total) by mouth at bedtime., Disp: 30 tablet, Rfl: 1  ROS  Constitutional: Denies any fever or chills Gastrointestinal: No reported hemesis, hematochezia, vomiting, or acute GI distress Musculoskeletal: Denies any acute onset joint swelling, redness, loss of ROM, or weakness Neurological: No reported episodes of acute onset apraxia, aphasia, dysarthria, agnosia, amnesia, paralysis, loss of coordination, or loss of consciousness  Allergies  Ms. Mcelmurry is allergic to ilosone [erythromycin].  [place]  Drug: Ms. Marasco  reports no history of drug use. Alcohol:  reports no history of alcohol use. Tobacco:  reports that she has never smoked. She has never been exposed to tobacco smoke. She has never used smokeless tobacco. Medical:  has a past medical history of Small fiber polyneuropathy and Spondylolysis. Surgical: Ms. Dobratz  has a past surgical history that includes back pain and Abdominal hysterectomy. Family: family history is not on file.  Constitutional Exam  General appearance: Well nourished,  well developed, and well hydrated. In no apparent acute distress Vitals:   01/07/21 1018  BP: 136/69  Pulse: 87  Resp: 16  Temp: (!) 96.9 F (36.1 C)  TempSrc: Temporal  SpO2: 100%  Weight: 112 lb (50.8 kg)  Height: _0  (1.6 m)   BMI Assessment: Estimated body mass index is 19.84 kg/m  as calculated from the following:   Height as of this encounter: _0  (1.6 m).   Weight as of this encounter: 112 lb (50.8 kg).  BMI interpretation table: BMI level Category Range association with higher incidence of chronic pain  <18 kg/m2 Underweight   18.5-24.9 kg/m2 Ideal body weight   25-29.9 kg/m2 Overweight Increased incidence by 20%  30-34.9 kg/m2 Obese (Class I) Increased incidence by 68%  35-39.9 kg/m2 Severe obesity (Class II) Increased incidence by 136%  >40 kg/m2 Extreme obesity (Class III) Increased incidence by 254%   Patient's current BMI Ideal Body weight  Body mass index is 19.84 kg/m. Ideal body weight: 52.4 kg (115 lb 8.3 oz)   BMI Readings from Last 4 Encounters:  01/07/21 19.84 kg/m  11/26/20 19.84 kg/m   Wt Readings from Last 4 Encounters:  01/07/21 112 lb (50.8 kg)  11/26/20 112 lb (50.8 kg)    Psych/Mental status: Alert, oriented x 3 (person, place, & time)       Eyes: PERLA Respiratory: No evidence of acute respiratory distress    Lumbar Spine Area Exam  Skin & Axial Inspection: No masses, redness, or swelling Alignment: Symmetrical Functional ROM: Pain restricted ROM       Stability: No instability detected Muscle Tone/Strength: Functionally intact. No obvious neuro-muscular anomalies detected. Sensory (Neurological): Dermatomal pain pattern Palpation: No palpable anomalies       Provocative Tests: Hyperextension/rotation test: deferred today       Lumbar quadrant test (Kemp's test): deferred today       Lateral bending test: deferred today       Patrick's Maneuver: deferred today                   FABER* test: (+) for bilateral S-I arthralgia              S-I anterior distraction/compression test: deferred today         S-I lateral compression test: (+)   S-I arthralgia/arthropathy S-I Thigh-thrust test: deferred today         S-I Gaenslen's test: deferred today         *(Flexion, ABduction and External Rotation) Gait & Posture Assessment  Ambulation: Unassisted Gait: Relatively normal for age and body habitus Posture: WNL  Lower Extremity Exam      Side: Right lower extremity   Side: Left lower extremity  Stability: No instability observed           Stability: No instability observed          Skin & Extremity Inspection: Skin color, temperature, and hair growth are WNL. No peripheral edema or cyanosis. No masses, redness, swelling, asymmetry, or associated skin lesions. No contractures.   Skin & Extremity Inspection: Skin color, temperature, and hair growth are WNL. No peripheral edema or cyanosis. No masses, redness, swelling, asymmetry, or associated skin lesions. No contractures.  Functional ROM: Unrestricted ROM                   Functional ROM: Unrestricted ROM                  Muscle Tone/Strength: Functionally intact. No obvious neuro-muscular anomalies detected.   Muscle Tone/Strength: Functionally intact. No obvious neuro-muscular anomalies detected.  Sensory (Neurological): Neurogenic pain pattern         Sensory (Neurological): Neurogenic pain pattern        DTR: Patellar: deferred today Achilles: deferred today Plantar: deferred today   DTR: Patellar: deferred today  Achilles: deferred today Plantar: deferred today  Palpation: No palpable anomalies   Palpation: No palpable anomalies     Assessment & Plan  Primary Diagnosis & Pertinent Problem List: The primary encounter diagnosis was Chronic radicular lumbar pain. Diagnoses of Neuroforaminal stenosis of lumbar spine (L5/S1, severe bilateral), Lumbar spondylosis (severe, L4/5), Lumbar degenerative disc disease, Pudendal neuralgia, Encounter for long-term opiate  analgesic use, Spinal stenosis, lumbar region, with neurogenic claudication, Small fiber neuropathy, Pain management contract signed, Piriformis syndrome of both sides, Chronic pain syndrome, Sacroiliac joint pain, and SI joint arthritis were also pertinent to this visit.  Visit Diagnosis: 1. Chronic radicular lumbar pain   2. Neuroforaminal stenosis of lumbar spine (L5/S1, severe bilateral)   3. Lumbar spondylosis (severe, L4/5)   4. Lumbar degenerative disc disease   5. Pudendal neuralgia   6. Encounter for long-term opiate analgesic use   7. Spinal stenosis, lumbar region, with neurogenic claudication   8. Small fiber neuropathy   9. Pain management contract signed   10. Piriformis syndrome of both sides   11. Chronic pain syndrome   12. Sacroiliac joint pain   13. SI joint arthritis    Problems updated and reviewed during this visit: Problem  Pain Management Contract Signed  Sacroiliac Joint Pain  Si Joint Arthritis    Plan of Care  Pharmacotherapy (Medications Ordered): Meds ordered this encounter  Medications   traMADol (ULTRAM) 50 MG tablet    Sig: Take 1 tablet (50 mg total) by mouth 2 (two) times daily.    Dispense:  60 tablet    Refill:  1   traMADol (ULTRAM-ER) 100 MG 24 hr tablet    Sig: Take 1 tablet (100 mg total) by mouth at bedtime.    Dispense:  30 tablet    Refill:  1   buprenorphine (BUTRANS) 20 MCG/HR PTWK    Sig: Place 1 patch onto the skin once a week.    Dispense:  4 patch    Refill:  1    For chronic pain syndrome   Procedure Orders         TRIGGER POINT INJECTION         SACROILIAC JOINT INJECTION     Plan for bilateral piriformis, bilateral sacroiliac injection pain related to piriformis syndrome, SI joint arthritis. Continue with home physical therapy exercises and stretches.   Pharmacological management options:  Opioid Analgesics: We'll take over management today. See above orders Membrane stabilizer: Tried and failed Muscle relaxant:  We have discussed the possibility of a trial NSAID: Trial discussed. Other analgesic(s): To be determined at a later time    Provider-requested follow-up: Return in about 1 week (around 01/14/2021) for B/L SI-J + B/L  piriformis, without sedation. Recent Visits Date Type Provider Dept  11/26/20 Office Visit Gillis Santa, MD [facility]  Showing recent visits within past 90 days and meeting all other requirements Today's Visits Date Type Provider Dept  01/07/21 Office Visit Gillis Santa, MD [facility]  Showing today's visits and meeting all other requirements Future Appointments Date Type Provider Dept  01/15/21 Appointment Gillis Santa, MD [facility]  03/18/21 Appointment Gillis Santa, MD [facility]  Showing future appointments within next 90 days and meeting all other requirements Primary Care Physician: Derinda Late, MD Note by: Gillis Santa, MD Date: 01/07/2021; Time: 11:16 AM

## 2021-01-07 NOTE — Patient Instructions (Signed)
GENERAL RISKS AND COMPLICATIONS  What are the risk, side effects and possible complications? Generally speaking, most procedures are safe.  However, with any procedure there are risks, side effects, and the possibility of complications.  The risks and complications are dependent upon the sites that are lesioned, or the type of nerve block to be performed.  The closer the procedure is to the spine, the more serious the risks are.  Great care is taken when placing the radio frequency needles, block needles or lesioning probes, but sometimes complications can occur. Infection: Any time there is an injection through the skin, there is a risk of infection.  This is why sterile conditions are used for these blocks.  There are four possible types of infection. Localized skin infection. Central Nervous System Infection-This can be in the form of Meningitis, which can be deadly. Epidural Infections-This can be in the form of an epidural abscess, which can cause pressure inside of the spine, causing compression of the spinal cord with subsequent paralysis. This would require an emergency surgery to decompress, and there are no guarantees that the patient would recover from the paralysis. Discitis-This is an infection of the intervertebral discs.  It occurs in about 1% of discography procedures.  It is difficult to treat and it may lead to surgery.        2. Pain: the needles have to go through skin and soft tissues, will cause soreness.       3. Damage to internal structures:  The nerves to be lesioned may be near blood vessels or    other nerves which can be potentially damaged.       4. Bleeding: Bleeding is more common if the patient is taking blood thinners such as  aspirin, Coumadin, Ticiid, Plavix, etc., or if he/she have some genetic predisposition  such as hemophilia. Bleeding into the spinal canal can cause compression of the spinal  cord with subsequent paralysis.  This would require an emergency  surgery to  decompress and there are no guarantees that the patient would recover from the  paralysis.       5. Pneumothorax:  Puncturing of a lung is a possibility, every time a needle is introduced in  the area of the chest or upper back.  Pneumothorax refers to free air around the  collapsed lung(s), inside of the thoracic cavity (chest cavity).  Another two possible  complications related to a similar event would include: Hemothorax and Chylothorax.   These are variations of the Pneumothorax, where instead of air around the collapsed  lung(s), you may have blood or chyle, respectively.       6. Spinal headaches: They may occur with any procedures in the area of the spine.       7. Persistent CSF (Cerebro-Spinal Fluid) leakage: This is a rare problem, but may occur  with prolonged intrathecal or epidural catheters either due to the formation of a fistulous  track or a dural tear.       8. Nerve damage: By working so close to the spinal cord, there is always a possibility of  nerve damage, which could be as serious as a permanent spinal cord injury with  paralysis.       9. Death:  Although rare, severe deadly allergic reactions known as "Anaphylactic  reaction" can occur to any of the medications used.      10. Worsening of the symptoms:  We can always make thing worse.  What are the chances   of something like this happening? Chances of any of this occuring are extremely low.  By statistics, you have more of a chance of getting killed in a motor vehicle accident: while driving to the hospital than any of the above occurring .  Nevertheless, you should be aware that they are possibilities.  In general, it is similar to taking a shower.  Everybody knows that you can slip, hit your head and get killed.  Does that mean that you should not shower again?  Nevertheless always keep in mind that statistics do not mean anything if you happen to be on the wrong side of them.  Even if a procedure has a 1 (one) in a  1,000,000 (million) chance of going wrong, it you happen to be that one..Also, keep in mind that by statistics, you have more of a chance of having something go wrong when taking medications.  Who should not have this procedure? If you are on a blood thinning medication (e.g. Coumadin, Plavix, see list of "Blood Thinners"), or if you have an active infection going on, you should not have the procedure.  If you are taking any blood thinners, please inform your physician.  How should I prepare for this procedure? Do not eat or drink anything at least six hours prior to the procedure. Bring a driver with you .  It cannot be a taxi. Come accompanied by an adult that can drive you back, and that is strong enough to help you if your legs get weak or numb from the local anesthetic. Take all of your medicines the morning of the procedure with just enough water to swallow them. If you have diabetes, make sure that you are scheduled to have your procedure done first thing in the morning, whenever possible. If you have diabetes, take only half of your insulin dose and notify our nurse that you have done so as soon as you arrive at the clinic. If you are diabetic, but only take blood sugar pills (oral hypoglycemic), then do not take them on the morning of your procedure.  You may take them after you have had the procedure. Do not take aspirin or any aspirin-containing medications, at least eleven (11) days prior to the procedure.  They may prolong bleeding. Wear loose fitting clothing that may be easy to take off and that you would not mind if it got stained with Betadine or blood. Do not wear any jewelry or perfume Remove any nail coloring.  It will interfere with some of our monitoring equipment.  NOTE: Remember that this is not meant to be interpreted as a complete list of all possible complications.  Unforeseen problems may occur.  BLOOD THINNERS The following drugs contain aspirin or other products,  which can cause increased bleeding during surgery and should not be taken for 2 weeks prior to and 1 week after surgery.  If you should need take something for relief of minor pain, you may take acetaminophen which is found in Tylenol,m Datril, Anacin-3 and Panadol. It is not blood thinner. The products listed below are.  Do not take any of the products listed below in addition to any listed on your instruction sheet.  A.P.C or A.P.C with Codeine Codeine Phosphate Capsules #3 Ibuprofen Ridaura  ABC compound Congesprin Imuran rimadil  Advil Cope Indocin Robaxisal  Alka-Seltzer Effervescent Pain Reliever and Antacid Coricidin or Coricidin-D  Indomethacin Rufen  Alka-Seltzer plus Cold Medicine Cosprin Ketoprofen S-A-C Tablets  Anacin Analgesic Tablets or Capsules Coumadin   Korlgesic Salflex  Anacin Extra Strength Analgesic tablets or capsules CP-2 Tablets Lanoril Salicylate  Anaprox Cuprimine Capsules Levenox Salocol  Anexsia-D Dalteparin Magan Salsalate  Anodynos Darvon compound Magnesium Salicylate Sine-off  Ansaid Dasin Capsules Magsal Sodium Salicylate  Anturane Depen Capsules Marnal Soma  APF Arthritis pain formula Dewitt's Pills Measurin Stanback  Argesic Dia-Gesic Meclofenamic Sulfinpyrazone  Arthritis Bayer Timed Release Aspirin Diclofenac Meclomen Sulindac  Arthritis pain formula Anacin Dicumarol Medipren Supac  Analgesic (Safety coated) Arthralgen Diffunasal Mefanamic Suprofen  Arthritis Strength Bufferin Dihydrocodeine Mepro Compound Suprol  Arthropan liquid Dopirydamole Methcarbomol with Aspirin Synalgos  ASA tablets/Enseals Disalcid Micrainin Tagament  Ascriptin Doan's Midol Talwin  Ascriptin A/D Dolene Mobidin Tanderil  Ascriptin Extra Strength Dolobid Moblgesic Ticlid  Ascriptin with Codeine Doloprin or Doloprin with Codeine Momentum Tolectin  Asperbuf Duoprin Mono-gesic Trendar  Aspergum Duradyne Motrin or Motrin IB Triminicin  Aspirin plain, buffered or enteric coated  Durasal Myochrisine Trigesic  Aspirin Suppositories Easprin Nalfon Trillsate  Aspirin with Codeine Ecotrin Regular or Extra Strength Naprosyn Uracel  Atromid-S Efficin Naproxen Ursinus  Auranofin Capsules Elmiron Neocylate Vanquish  Axotal Emagrin Norgesic Verin  Azathioprine Empirin or Empirin with Codeine Normiflo Vitamin E  Azolid Emprazil Nuprin Voltaren  Bayer Aspirin plain, buffered or children's or timed BC Tablets or powders Encaprin Orgaran Warfarin Sodium  Buff-a-Comp Enoxaparin Orudis Zorpin  Buff-a-Comp with Codeine Equegesic Os-Cal-Gesic   Buffaprin Excedrin plain, buffered or Extra Strength Oxalid   Bufferin Arthritis Strength Feldene Oxphenbutazone   Bufferin plain or Extra Strength Feldene Capsules Oxycodone with Aspirin   Bufferin with Codeine Fenoprofen Fenoprofen Pabalate or Pabalate-SF   Buffets II Flogesic Panagesic   Buffinol plain or Extra Strength Florinal or Florinal with Codeine Panwarfarin   Buf-Tabs Flurbiprofen Penicillamine   Butalbital Compound Four-way cold tablets Penicillin   Butazolidin Fragmin Pepto-Bismol   Carbenicillin Geminisyn Percodan   Carna Arthritis Reliever Geopen Persantine   Carprofen Gold's salt Persistin   Chloramphenicol Goody's Phenylbutazone   Chloromycetin Haltrain Piroxlcam   Clmetidine heparin Plaquenil   Cllnoril Hyco-pap Ponstel   Clofibrate Hydroxy chloroquine Propoxyphen         Before stopping any of these medications, be sure to consult the physician who ordered them.  Some, such as Coumadin (Warfarin) are ordered to prevent or treat serious conditions such as "deep thrombosis", "pumonary embolisms", and other heart problems.  The amount of time that you may need off of the medication may also vary with the medication and the reason for which you were taking it.  If you are taking any of these medications, please make sure you notify your pain physician before you undergo any procedures.         Trigger Point  Injection Trigger points are areas where you have pain. A trigger point injection is a shot given in the trigger point to help relieve pain for a few days to a few months. Common places for trigger points include: The neck. The shoulders. The upper back. The lower back. A trigger point injection will not cure long-term (chronic) pain permanently. These injections do not always work for every person. Forsome people, they can help to relieve pain for a few days to a few months. Tell a health care provider about: Any allergies you have. All medicines you are taking, including vitamins, herbs, eye drops, creams, and over-the-counter medicines. Any problems you or family members have had with anesthetic medicines. Any blood disorders you have. Any surgeries you have had. Any medical conditions you have. What are   the risks? Generally, this is a safe procedure. However, problems may occur, including: Infection. Bleeding or bruising. Allergic reaction to the injected medicine. Irritation of the skin around the injection site. What happens before the procedure? Ask your health care provider about: Changing or stopping your regular medicines. This is especially important if you are taking diabetes medicines or blood thinners. Taking medicines such as aspirin and ibuprofen. These medicines can thin your blood. Do not take these medicines unless your health care provider tells you to take them. Taking over-the-counter medicines, vitamins, herbs, and supplements. What happens during the procedure?  Your health care provider will feel for trigger points. A marker may be used to circle the area for the injection. The skin over the trigger point will be washed with a germ-killing (antiseptic) solution. A thin needle is used for the injection. You may feel pain or a twitching feeling when the needle enters the trigger point. A numbing solution may be injected into the trigger point. Sometimes a medicine  to keep down inflammation is also injected. Your health care provider may move the needle around the area where the trigger point is located until the tightness and twitching goes away. After the injection, your health care provider may put gentle pressure over the injection site. The injection site will be covered with a bandage (dressing). The procedure may vary among health care providers and hospitals. What can I expect after treatment? After treatment, you may have: Soreness and stiffness for 1-2 days. A dressing. This can be taken off in a few hours or as told by your health care provider. Follow these instructions at home: Injection site care Remove your dressing as told by your health care provider. Check your injection site every day for signs of infection. Check for: Redness, swelling, or pain. Fluid or blood. Warmth. Pus or a bad smell. Managing pain, stiffness, and swelling If directed, put ice on the affected area. Put ice in a plastic bag. Place a towel between your skin and the bag. Leave the ice on for 20 minutes, 2-3 times a day. General instructions If you were asked to stop your regular medicines, ask your health care provider when you may start taking them again. Return to your normal activities as told by your health care provider. Ask your health care provider what activities are safe for you. Do not take baths, swim, or use a hot tub until your health care provider approves. You may be asked to see an occupational or physical therapist for exercises that reduce muscle strain and stretch the area of the trigger point. Keep all follow-up visits as told by your health care provider. This is important. Contact a health care provider if: Your pain comes back, and it is worse than before the injection. You may need more injections. You have chills or a fever. The injection site becomes more painful, red, swollen, or warm to the touch. Summary A trigger point  injection is a shot given in the trigger point to help relieve pain for a few days to a few months. Common places for trigger point injections are the neck, shoulder, upper back, and lower back. These injections do not always work for every person, but for some people, the injections can help to relieve pain for a few days to a few months. Contact a health care provider if symptoms come back or they are worse than before treatment. Also, get help if the injection site becomes more painful, red, swollen, or warm   to the touch. This information is not intended to replace advice given to you by your health care provider. Make sure you discuss any questions you have with your healthcare provider. Document Revised: 06/22/2018 Document Reviewed: 06/22/2018 Elsevier Patient Education  2022 [employer] Sacroiliac (SI) Joint Injection Patient Information  Description: The sacroiliac joint connects the scrum (very low back and tailbone) to the ilium (a pelvic bone which also forms half of the hip joint).  Normally this joint experiences very little motion.  When this joint becomes inflamed or unstable low back and or hip and pelvis pain may result.  Injection of this joint with local anesthetics (numbing medicines) and steroids can provide diagnostic information and reduce pain.  This injection is performed with the aid of x-ray guidance into the tailbone area while you are lying on your stomach.   You may experience an electrical sensation down the leg while this is being done.  You may also experience numbness.  We also may ask if we are reproducing your normal pain during the injection.  Conditions which may be treated SI injection:  Low back, buttock, hip or leg pain  Preparation for the Injection:  Do not eat any solid food or dairy products within 8 hours of your appointment.  You may drink clear liquids up to 3 hours before appointment.  Clear liquids include water, black coffee, juice or soda.   No milk or cream please. You may take your regular medications, including pain medications with a sip of water before your appointment.  Diabetics should hold regular insulin (if take separately) and take 1/2 normal NPH dose the morning of the procedure.  Carry some sugar containing items with you to your appointment. A driver must accompany you and be prepared to drive you home after your procedure. Bring all of your current medications with you. An IV may be inserted and sedation may be given at the discretion of the physician. A blood pressure cuff, EKG and other monitors will often be applied during the procedure.  Some patients may need to have extra oxygen administered for a short period.  You will be asked to provide medical information, including your allergies, prior to the procedure.  We must know immediately if you are taking blood thinners (like Coumadin/Warfarin) or if you are allergic to IV iodine contrast (dye).  We must know if you could possible be pregnant.  Possible side effects:  Bleeding from needle site Infection (rare, may require surgery) Nerve injury (rare) Numbness & tingling (temporary) A brief convulsion or seizure Light-headedness (temporary) Pain at injection site (several days) Decreased blood pressure (temporary) Weakness in the leg (temporary)   Call if you experience:  New onset weakness or numbness of an extremity below the injection site that last more than 8 hours. Hives or difficulty breathing ( go to the emergency room) Inflammation or drainage at the injection site Any new symptoms which are concerning to you  Please note:  Although the local anesthetic injected can often make your back/ hip/ buttock/ leg feel good for several hours after the injections, the pain will likely return.  It takes 3-7 days for steroids to work in the sacroiliac area.  You may not notice any pain relief for at least that one week.  If effective, we will often do a  series of three injections spaced 3-6 weeks apart to maximally decrease your pain.  After the initial series, we generally will wait some months before a repeat injection of   the same type.  If you have any questions, please call [phone] [facility]   

## 2021-01-07 NOTE — Progress Notes (Signed)
Safety precautions to be maintained throughout the outpatient stay will include: orient to surroundings, keep bed in low position, maintain call bell within reach at all times, provide assistance with transfer out of bed and ambulation.  

## 2021-01-15 ENCOUNTER — Ambulatory Visit
Admission: RE | Admit: 2021-01-15 | Discharge: 2021-01-15 | Disposition: A | Discharge status: Home / Self Care | Payer: Medicare Other | Source: Ambulatory Visit | Attending: Student in an Organized Health Care Education/Training Program | Admitting: Student in an Organized Health Care Education/Training Program

## 2021-01-15 ENCOUNTER — Encounter: Payer: Self-pay | Admitting: Student in an Organized Health Care Education/Training Program

## 2021-01-15 ENCOUNTER — Ambulatory Visit (HOSPITAL_BASED_OUTPATIENT_CLINIC_OR_DEPARTMENT_OTHER): Payer: Medicare Other | Admitting: Student in an Organized Health Care Education/Training Program

## 2021-01-15 ENCOUNTER — Other Ambulatory Visit: Payer: Self-pay

## 2021-01-15 VITALS — BP 173/98 | HR 88 | Temp 97.4°F | Resp 18 | Ht 63.0 in | Wt 112.0 lb

## 2021-01-15 DIAGNOSIS — G894 Chronic pain syndrome: Secondary | ICD-10-CM | POA: Insufficient documentation

## 2021-01-15 DIAGNOSIS — M47818 Spondylosis without myelopathy or radiculopathy, sacral and sacrococcygeal region: Secondary | ICD-10-CM | POA: Insufficient documentation

## 2021-01-15 DIAGNOSIS — G5703 Lesion of sciatic nerve, bilateral lower limbs: Secondary | ICD-10-CM | POA: Insufficient documentation

## 2021-01-15 DIAGNOSIS — M533 Sacrococcygeal disorders, not elsewhere classified: Secondary | ICD-10-CM | POA: Insufficient documentation

## 2021-01-15 MED ORDER — ROPIVACAINE HCL 2 MG/ML IJ SOLN
9.0000 mL | Freq: Once | INTRAMUSCULAR | Status: AC
  Administered 2021-01-15: 9 mL via PERINEURAL

## 2021-01-15 MED ORDER — LIDOCAINE HCL 2 % IJ SOLN
20.0000 mL | Freq: Once | INTRAMUSCULAR | Status: AC
  Administered 2021-01-15: 100 mg

## 2021-01-15 MED ORDER — DEXAMETHASONE SODIUM PHOSPHATE 10 MG/ML IJ SOLN
10.0000 mg | Freq: Once | INTRAMUSCULAR | Status: AC
  Administered 2021-01-15: 10 mg
  Filled 2021-01-15: qty 1

## 2021-01-15 MED ORDER — METHYLPREDNISOLONE ACETATE 80 MG/ML IJ SUSP
80.0000 mg | Freq: Once | INTRAMUSCULAR | Status: AC
  Administered 2021-01-15: 80 mg via INTRA_ARTICULAR
  Filled 2021-01-15: qty 1

## 2021-01-15 MED ORDER — IOHEXOL 180 MG/ML  SOLN
10.0000 mL | Freq: Once | INTRAMUSCULAR | Status: AC
  Administered 2021-01-15: 5 mL via INTRA_ARTICULAR

## 2021-01-15 MED ORDER — SODIUM CHLORIDE (PF) 0.9 % IJ SOLN
INTRAMUSCULAR | Status: AC
  Filled 2021-01-15: qty 10

## 2021-01-15 NOTE — Patient Instructions (Signed)
Pain Management Discharge Instructions  General Discharge Instructions :  If you need to reach your doctor call: Monday-Friday 8:00 am - 4:00 pm at [phone] or toll free [phone].  After clinic hours [phone] to have operator reach doctor.  Bring all of your medication bottles to all your appointments in the pain clinic.  To cancel or reschedule your appointment with Pain Management please remember to call 24 hours in advance to avoid a fee.  Refer to the educational materials which you have been given on: General Risks, I had my Procedure. Discharge Instructions, Post Sedation.  Post Procedure Instructions:  The drugs you were given will stay in your system until tomorrow, so for the next 24 hours you should not drive, make any legal decisions or drink any alcoholic beverages.  You may eat anything you prefer, but it is better to start with liquids then soups and crackers, and gradually work up to solid foods.  Please notify your doctor immediately if you have any unusual bleeding, trouble breathing or pain that is not related to your normal pain.  Depending on the type of procedure that was done, some parts of your body may feel week and/or numb.  This usually clears up by tonight or the next day.  Walk with the use of an assistive device or accompanied by an adult for the 24 hours.  You may use ice on the affected area for the first 24 hours.  Put ice in a Ziploc bag and cover with a towel and place against area 15 minutes on 15 minutes off.  You may switch to heat after 24 hours.Sacroiliac (SI) Joint Injection Patient Information  Description: The sacroiliac joint connects the scrum (very low back and tailbone) to the ilium (a pelvic bone which also forms half of the hip joint).  Normally this joint experiences very little motion.  When this joint becomes inflamed or unstable low back and or hip and pelvis pain may result.  Injection of this joint with local anesthetics  (numbing medicines) and steroids can provide diagnostic information and reduce pain.  This injection is performed with the aid of x-ray guidance into the tailbone area while you are lying on your stomach.   You may experience an electrical sensation down the leg while this is being done.  You may also experience numbness.  We also may ask if we are reproducing your normal pain during the injection.  Conditions which may be treated SI injection:  Low back, buttock, hip or leg pain  Preparation for the Injection:  Do not eat any solid food or dairy products within 8 hours of your appointment.  You may drink clear liquids up to 3 hours before appointment.  Clear liquids include water, black coffee, juice or soda.  No milk or cream please. You may take your regular medications, including pain medications with a sip of water before your appointment.  Diabetics should hold regular insulin (if take separately) and take 1/2 normal NPH dose the morning of the procedure.  Carry some sugar containing items with you to your appointment. A driver must accompany you and be prepared to drive you home after your procedure. Bring all of your current medications with you. An IV may be inserted and sedation may be given at the discretion of the physician. A blood pressure cuff, EKG and other monitors will often be applied during the procedure.  Some patients may need to have extra oxygen administered for a short period.  You will   be asked to provide medical information, including your allergies, prior to the procedure.  We must know immediately if you are taking blood thinners (like Coumadin/Warfarin) or if you are allergic to IV iodine contrast (dye).  We must know if you could possible be pregnant.  Possible side effects:  Bleeding from needle site Infection (rare, may require surgery) Nerve injury (rare) Numbness & tingling (temporary) A brief convulsion or seizure Light-headedness (temporary) Pain at  injection site (several days) Decreased blood pressure (temporary) Weakness in the leg (temporary)   Call if you experience:  New onset weakness or numbness of an extremity below the injection site that last more than 8 hours. Hives or difficulty breathing ( go to the emergency room) Inflammation or drainage at the injection site Any new symptoms which are concerning to you  Please note:  Although the local anesthetic injected can often make your back/ hip/ buttock/ leg feel good for several hours after the injections, the pain will likely return.  It takes 3-7 days for steroids to work in the sacroiliac area.  You may not notice any pain relief for at least that one week.  If effective, we will often do a series of three injections spaced 3-6 weeks apart to maximally decrease your pain.  After the initial series, we generally will wait some months before a repeat injection of the same type.  If you have any questions, please call [phone] [facility]   

## 2021-01-15 NOTE — Progress Notes (Signed)
Safety precautions to be maintained throughout the outpatient stay will include: orient to surroundings, keep bed in low position, maintain call bell within reach at all times, provide assistance with transfer out of bed and ambulation.  

## 2021-01-15 NOTE — Progress Notes (Signed)
PROVIDER NOTE: Information contained herein reflects review and annotations entered in association with encounter. Interpretation of such information and data should be left to medically-trained personnel. Information provided to patient can be located elsewhere in the medical record under "Patient Instructions". Document created using STT-dictation technology, any transcriptional errors that may result from process are unintentional.    Patient: Sheila Newton  Service Category: Procedure  Provider: Edward JollyBilal Tonatiuh Mallon, MD  DOB: 06-05-1940  DOS: 01/15/2021  Location: [facility]  MRN: [id number]  Setting: Ambulatory - outpatient  Referring Provider: Kandyce RudBabaoff, Marcus, MD  Type: Established Patient  Specialty: Interventional Pain Management  PCP: Kandyce RudBabaoff, Marcus, MD   Primary Reason for Visit: Interventional Pain Management Treatment. CC: No chief complaint on file.    Procedure:          Anesthesia, Analgesia, Anxiolysis:  Type: Diagnostic Sacroiliac Joint Steroid Injection         and piriformis muscle trigger point injection Region: Superior Lumbosacral Region Level: PIIS (Posterior Inferior Iliac Spine) Laterality: Bilateral  Type: Local Anesthesia  Local Anesthetic: Lidocaine 1-2%  Position: Prone           Indications: 1. Sacroiliac joint pain   2. SI joint arthritis   3. Piriformis syndrome of both sides   4. Chronic pain syndrome    Pain Score: Pre-procedure: 3 /10 Post-procedure: 3 /10     Pre-op H&P Assessment:  Sheila Newton is a 80 y.o. (year old), female patient, seen today for interventional treatment. She  has a past surgical history that includes back pain and Abdominal hysterectomy. Sheila Newton has a current medication list which includes the following prescription(s): aspirin, atorvastatin, [START ON 01/31/2021] buprenorphine, coenzyme q10-vitamin e, cyanocobalamin, estradiol, lubiprostone, magnesium, multivitamin adult, pantoprazole, pyridoxine hcl, [START ON 01/18/2021]  tramadol, and [START ON 01/25/2021] tramadol. Her primarily concern today is the No chief complaint on file.  Initial Vital Signs:  Pulse/HCG Rate: 88ECG Heart Rate: 89 Temp: (!) 97.4 F (36.3 C) Resp: 16 BP: (!) 151/82 SpO2: 98 %  BMI: Estimated body mass index is 19.84 kg/m as calculated from the following:   Height as of this encounter: 5\' 3"  (1.6 m).   Weight as of this encounter: 112 lb (50.8 kg).  Risk Assessment: Allergies: Reviewed. She is allergic to ilosone [erythromycin].  Allergy Precautions: None required Coagulopathies: Reviewed. None identified.  Blood-thinner therapy: None at this time Active Infection(s): Reviewed. None identified. Sheila Newton is afebrile  Site Confirmation: Sheila Newton was asked to confirm the procedure and laterality before marking the site Procedure checklist: Completed Consent: Before the procedure and under the influence of no sedative(s), amnesic(s), or anxiolytics, the patient was informed of the treatment options, risks and possible complications. To fulfill our ethical and legal obligations, as recommended by the American Medical Association's Code of Ethics, I have informed the patient of my clinical impression; the nature and purpose of the treatment or procedure; the risks, benefits, and possible complications of the intervention; the alternatives, including doing nothing; the risk(s) and benefit(s) of the alternative treatment(s) or procedure(s); and the risk(s) and benefit(s) of doing nothing. The patient was provided information about the general risks and possible complications associated with the procedure. These may include, but are not limited to: failure to achieve desired goals, infection, bleeding, organ or nerve damage, allergic reactions, paralysis, and death. In addition, the patient was informed of those risks and complications associated to the procedure, such as failure to decrease pain; infection; bleeding; organ or nerve damage with  subsequent damage to sensory, motor, and/or autonomic systems, resulting in permanent pain, numbness, and/or weakness of one or several areas of the body; allergic reactions; (i.e.: anaphylactic reaction); and/or death. Furthermore, the patient was informed of those risks and complications associated with the medications. These include, but are not limited to: allergic reactions (i.e.: anaphylactic or anaphylactoid reaction(s)); adrenal axis suppression; blood sugar elevation that in diabetics may result in ketoacidosis or comma; water retention that in patients with history of congestive heart failure may result in shortness of breath, pulmonary edema, and decompensation with resultant heart failure; weight gain; swelling or edema; medication-induced neural toxicity; particulate matter embolism and blood vessel occlusion with resultant organ, and/or nervous system infarction; and/or aseptic necrosis of one or more joints. Finally, the patient was informed that Medicine is not an exact science; therefore, there is also the possibility of unforeseen or unpredictable risks and/or possible complications that may result in a catastrophic outcome. The patient indicated having understood very clearly. We have given the patient no guarantees and we have made no promises. Enough time was given to the patient to ask questions, all of which were answered to the patient's satisfaction. Sheila Newton has indicated that she wanted to continue with the procedure. Attestation: I, the ordering provider, attest that I have discussed with the patient the benefits, risks, side-effects, alternatives, likelihood of achieving goals, and potential problems during recovery for the procedure that I have provided informed consent. Date  Time: 01/15/2021 11:36 AM  Pre-Procedure Preparation:  Monitoring: As per clinic protocol. Respiration, ETCO2, SpO2, BP, heart rate and rhythm monitor placed and checked for adequate function Safety  Precautions: Patient was assessed for positional comfort and pressure points before starting the procedure. Time-out: I initiated and conducted the "Time-out" before starting the procedure, as per protocol. The patient was asked to participate by confirming the accuracy of the "Time Out" information. Verification of the correct person, site, and procedure were performed and confirmed by me, the nursing staff, and the patient. "Time-out" conducted as per Joint Commission's Universal Protocol (UP.01.01.01). Time: 1214  Description of Procedure:          Target Area: Superior, posterior, aspect of the sacroiliac fissure Approach: Posterior, paraspinal, ipsilateral approach. Area Prepped: Entire Lower Lumbosacral Region DuraPrep (Iodine Povacrylex [0.7% available iodine] and Isopropyl Alcohol, 74% w/w) Safety Precautions: Aspiration looking for blood return was conducted prior to all injections. At no point did we inject any substances, as a needle was being advanced. No attempts were made at seeking any paresthesias. Safe injection practices and needle disposal techniques used. Medications properly checked for expiration dates. SDV (single dose vial) medications used. Description of the Procedure: Protocol guidelines were followed. The patient was placed in position over the procedure table. The target area was identified and the area prepped in the usual manner. Skin & deeper tissues infiltrated with local anesthetic. Appropriate amount of time allowed to pass for local anesthetics to take effect. The procedure needle was advanced under fluoroscopic guidance into the sacroiliac joint until a firm endpoint was obtained. Proper needle placement secured. Negative aspiration confirmed. Solution injected in intermittent fashion, asking for systemic symptoms every 0.5cc of injectate. The needles were then removed and the area cleansed, making sure to leave some of the prepping solution back to take advantage of  its long term bactericidal properties. Vitals:   01/15/21 1218 01/15/21 1223 01/15/21 1228 01/15/21 1231  BP: (!) 158/109 (!) 166/108 (!) 170/95 (!) 173/98  Pulse:  Resp: 16 20 16 18   Temp:      TempSrc:      SpO2: 96% 98% 95% 95%  Weight:      Height:        Start Time: 1214 hrs. End Time: 1230 hrs. Materials:  Needle(s) Type: Spinal Needle Gauge: 25G Length: 3.5-in Medication(s): Please see orders for medications and dosing details.  10 cc solution made of 9 cc of 0.2% ropivacaine, 1 cc of methylprednisolone, 80 mg/cc. 5 cc injected in the left sacroiliac joint, 5 cc injected into the right sacroiliac joint after contrast confirmation.  Afterwards bilateral piriformis injections were also performed.  1 cm inferior, 1 cm deep and 1 cm lateral to the inferior SI joint fissure, the piriformis muscle belly was identified with striations after contrast confirmation.  Afterwards, 10 cc solution made of 9 cc of 0.2% ropivacaine, 1 cc of Decadron 10 mg/cc of that, 5 cc was injected along the left piriformis muscle, 5 cc was injected along the right piriformis muscle.  No radiating leg pain during time of injection.  Imaging Guidance (Non-Spinal):          Type of Imaging Technique: Fluoroscopy Guidance (Non-Spinal) Indication(s): Assistance in needle guidance and placement for procedures requiring needle placement in or near specific anatomical locations not easily accessible without such assistance. Exposure Time: Please see nurses notes. Contrast: Before injecting any contrast, we confirmed that the patient did not have an allergy to iodine, shellfish, or radiological contrast. Once satisfactory needle placement was completed at the desired level, radiological contrast was injected. Contrast injected under live fluoroscopy. No contrast complications. See chart for type and volume of contrast used. Fluoroscopic Guidance: I was personally present during the use of fluoroscopy. "Tunnel  Vision Technique" used to obtain the best possible view of the target area. Parallax error corrected before commencing the procedure. "Direction-depth-direction" technique used to introduce the needle under continuous pulsed fluoroscopy. Once target was reached, antero-posterior, oblique, and lateral fluoroscopic projection used confirm needle placement in all planes. Images permanently stored in EMR. Interpretation: I personally interpreted the imaging intraoperatively. Adequate needle placement confirmed in multiple planes. Appropriate spread of contrast into desired area was observed. No evidence of afferent or efferent intravascular uptake. Permanent images saved into the patient's record.  Post-operative Assessment:  Post-procedure Vital Signs:  Pulse/HCG Rate: 88(!) 102 Temp: (!) 97.4 F (36.3 C) Resp: 18 BP: (!) 173/98 SpO2: 95 %  EBL: None  Complications: No immediate post-treatment complications observed by team, or reported by patient.  Note: The patient tolerated the entire procedure well. A repeat set of vitals were taken after the procedure and the patient was kept under observation following institutional policy, for this type of procedure. Post-procedural neurological assessment was performed, showing return to baseline, prior to discharge. The patient was provided with post-procedure discharge instructions, including a section on how to identify potential problems. Should any problems arise concerning this procedure, the patient was given instructions to immediately contact , at any time, without hesitation. In any case, we plan to contact the patient by telephone for a follow-up status report regarding this interventional procedure.  Comments:  No additional relevant information.  Plan of Care  Orders:  Orders Placed This Encounter  Procedures   DG PAIN CLINIC C-ARM 1-60 MIN NO REPORT    Intraoperative interpretation by procedural physician at [facility].     Standing Status:   Standing    Number of Occurrences:   1    Order  Specific Question:   Reason for exam:    Answer:   Assistance in needle guidance and placement for procedures requiring needle placement in or near specific anatomical locations not easily accessible without such assistance.   Chronic Opioid Analgesic:  Butrans patch 20 mcg an hour, tramadol 50 mg twice daily as needed for breakthrough pain, tramadol ER 100 mg nightly   Medications ordered for procedure: Meds ordered this encounter  Medications   iohexol (OMNIPAQUE) 180 MG/ML injection 10 mL    Must be Myelogram-compatible. If not available, you may substitute with a water-soluble, non-ionic, hypoallergenic, myelogram-compatible radiological contrast medium.   lidocaine (XYLOCAINE) 2 % (with pres) injection 400 mg   dexamethasone (DECADRON) injection 10 mg   ropivacaine (PF) 2 mg/mL (0.2%) (NAROPIN) injection 9 mL   ropivacaine (PF) 2 mg/mL (0.2%) (NAROPIN) injection 9 mL   methylPREDNISolone acetate (DEPO-MEDROL) injection 80 mg   Medications administered: We administered iohexol, lidocaine, dexamethasone, ropivacaine (PF) 2 mg/mL (0.2%), ropivacaine (PF) 2 mg/mL (0.2%), and methylPREDNISolone acetate.  See the medical record for exact dosing, route, and time of administration.  Follow-up plan:   Return in about 4 weeks (around 02/12/2021) for Post Procedure Evaluation, virtual.       Bilateral SIJ/ piriformis 01/15/21   Recent Visits Date Type Provider Dept  01/07/21 Office Visit Edward Jolly, MD [facility]  11/26/20 Office Visit Edward Jolly, MD [facility]  Showing recent visits within past 90 days and meeting all other requirements Today's Visits Date Type Provider Dept  01/15/21 Procedure visit Edward Jolly, MD [facility]  Showing today's visits and meeting all other requirements Future Appointments Date Type Provider Dept  02/24/21 Appointment Edward Jolly, MD [facility]  03/18/21 Appointment Edward Jolly, MD [facility]  Showing future appointments within next 90 days and meeting all other requirements Disposition: Discharge home  Discharge (Date  Time): 01/15/2021; 1241 hrs.   Primary Care Physician: Kandyce Rud, MD Location: [facility] Note by: Edward Jolly, MD Date: 01/15/2021; Time: 1:02 PM  Disclaimer:  Medicine is not an exact science. The only guarantee in medicine is that nothing is guaranteed. It is important to note that the decision to proceed with this intervention was based on the information collected from the patient. The Data and conclusions were drawn from the patient's questionnaire, the interview, and the physical examination. Because the information was provided in large part by the patient, it cannot be guaranteed that it has not been purposely or unconsciously manipulated. Every effort has been made to obtain as much relevant data as possible for this evaluation. It is important to note that the conclusions that lead to this procedure are derived in large part from the available data. Always take into account that the treatment will also be dependent on availability of resources and existing treatment guidelines, considered by other Pain Management Practitioners as being common knowledge and practice, at the time of the intervention. For Medico-Legal purposes, it is also important to point out that variation in procedural techniques and pharmacological choices are the acceptable norm. The indications, contraindications, technique, and results of the above procedure should only be interpreted and judged by a Board-Certified Interventional Pain Specialist with extensive familiarity and expertise in the same exact procedure and technique.

## 2021-01-16 ENCOUNTER — Encounter: Payer: Self-pay | Admitting: Student in an Organized Health Care Education/Training Program

## 2021-01-16 ENCOUNTER — Telehealth: Payer: Self-pay

## 2021-01-16 NOTE — Telephone Encounter (Signed)
Called PP. States that she tried call Dr Cherylann Ratel last night. States bottom of left foot was burning when she was at  the desk 3/10 pain. Felt no relief states she is having so much pain. Was unable to sit, stand. Pain is better today. Instructed to use heat today. States that she did not use ice yesterday. Patient wanting to know policy for after hours and having Md to return her call. Manager number given. Notified Dr Cherylann Ratel and he is to calling  Patient.

## 2021-01-16 NOTE — Progress Notes (Unsigned)
After post procedure nursing phone call, I was informed that patient had increased pain yesterday after her bilateral sacroiliac joint and bilateral piriformis injection.  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  I spoke to her this morning.  She states that she was having a sciatica flare yesterday.  She called the operator.  Message was received this morning via voicemail.  I provided her with reassurance.  She states that she is doing better this morning.  She states that she will continue to monitor her symptoms.  I suggested that if her pain worsens can consider a steroid taper.  Patient was agreeable to that.  She has a postprocedural follow-up scheduled with me early October.

## 2021-02-12 ENCOUNTER — Telehealth: Payer: Medicare Other | Admitting: Student in an Organized Health Care Education/Training Program

## 2021-02-20 ENCOUNTER — Telehealth: Payer: Self-pay

## 2021-02-20 ENCOUNTER — Encounter: Payer: Self-pay | Admitting: Student in an Organized Health Care Education/Training Program

## 2021-02-20 NOTE — Telephone Encounter (Signed)
LM for patient to call office for pre virtual appointment questions.  

## 2021-02-24 ENCOUNTER — Ambulatory Visit
Payer: Medicare Other | Attending: Student in an Organized Health Care Education/Training Program | Admitting: Student in an Organized Health Care Education/Training Program

## 2021-02-24 ENCOUNTER — Other Ambulatory Visit: Payer: Self-pay

## 2021-02-24 DIAGNOSIS — M533 Sacrococcygeal disorders, not elsewhere classified: Secondary | ICD-10-CM | POA: Diagnosis not present

## 2021-02-24 DIAGNOSIS — G894 Chronic pain syndrome: Secondary | ICD-10-CM

## 2021-02-24 DIAGNOSIS — M47818 Spondylosis without myelopathy or radiculopathy, sacral and sacrococcygeal region: Secondary | ICD-10-CM

## 2021-02-24 NOTE — Progress Notes (Signed)
Patient: Sheila Newton  Service Category: E/M  Provider: Gillis Santa, MD  DOB: 1940-06-07  DOS: 02/24/2021  Location: Office  MRN: [id number]  Setting: Ambulatory outpatient  Referring Provider: Derinda Late, MD  Type: Established Patient  Specialty: Interventional Pain Management  PCP: Derinda Late, MD  Location: Home  Delivery: TeleHealth     Virtual Encounter - Pain Management PROVIDER NOTE: Information contained herein reflects review and annotations entered in association with encounter. Interpretation of such information and data should be left to medically-trained personnel. Information provided to patient can be located elsewhere in the medical record under "Patient Instructions". Document created using STT-dictation technology, any transcriptional errors that may result from process are unintentional.    Contact & Pharmacy Preferred: [phone] Home: [phone] (home) Mobile: [phone] (mobile) E-mail: No e-mail address on record  [place], [place] - [address] [place] [address] Phone: [phone] Fax: [phone]   Pre-screening  Ms. Cassel offered "in-person" vs "virtual" encounter. She indicated preferring virtual for this encounter.   Reason COVID-19*  Social distancing based on CDC and AMA recommendations.   I contacted Sheila Newton on 02/24/2021 via telephone.      I clearly identified myself as Gillis Santa, MD. I verified that I was speaking with the correct person using two identifiers (Name: Alesandra Smart, and date of birth: 1940/09/02).  Consent I sought verbal advanced consent from Sheila Newton for virtual visit interactions. I informed Ms. Gaddie of possible security and privacy concerns, risks, and limitations associated with providing "not-in-person" medical evaluation and management services. I also informed Ms. Mccombs of the availability of "in-person" appointments. Finally, I informed her that there would be a charge for  the virtual visit and that she could be  personally, fully or partially, financially responsible for it. Ms. Osterman expressed understanding and agreed to proceed.   Historic Elements   Ms. Dvora Buitron is a 80 y.o. year old, female patient evaluated today after our last contact on 01/15/2021. Ms. Lai  has a past medical history of Small fiber polyneuropathy and Spondylolysis. She also  has a past surgical history that includes back pain and Abdominal hysterectomy. Ms. Charlet has a current medication list which includes the following prescription(s): aspirin, atorvastatin, buprenorphine, coenzyme q10-vitamin e, cyanocobalamin, estradiol, lubiprostone, magnesium, multivitamin adult, pantoprazole, pyridoxine hcl, tramadol, and tramadol. She  reports that she has never smoked. She has never been exposed to tobacco smoke. She has never used smokeless tobacco. She reports that she does not drink alcohol and does not use drugs. Ms. Schimpf is allergic to ilosone [erythromycin].   HPI  Today, she is being contacted for a post-procedure assessment.   Post-Procedure Evaluation  Procedure (01/15/2021):  Bilateral sacroiliac joint injection and bilateral piriformis injection  Anxiolysis: Please see nurses note.  Effectiveness during initial hour after procedure (Ultra-Short Term Relief): 100 %   Local anesthetic used: Long-acting (4-6 hours) Effectiveness: Defined as any analgesic benefit obtained secondary to the administration of local anesthetics. This carries significant diagnostic value as to the etiological location, or anatomical origin, of the pain. Duration of benefit is expected to coincide with the duration of the local anesthetic used.  Effectiveness during initial 4-6 hours after procedure (Short-Term Relief): 0 %   Long-term benefit: Defined as any relief past the pharmacologic duration of the local anesthetics.  Effectiveness past the initial 6 hours after procedure (Long-Term Relief): 0 %    Benefits, current: Defined as benefit present at the time  of this evaluation.  0%, unfortunately no long-term pain relief.  Do not recommend repeating.  Continue with medication management.  Keep follow-up for medication refill later this month.  Laboratory Chemistry Profile   Renal No results found for: BUN, CREATININE, LABCREA, BCR, GFR, GFRAA, GFRNONAA, LABVMA, EPIRU, EPINEPH24HUR, NOREPRU, NOREPI24HUR, DOPARU, [id number]  Hepatic No results found for: AST, ALT, ALBUMIN, ALKPHOS, HCVAB, AMYLASE, LIPASE, AMMONIA  Electrolytes No results found for: NA, K, CL, CALCIUM, MG, PHOS  Bone No results found for: VD25OH, [id number], [id number], [id number], 25OHVITD1, 25OHVITD2, 25OHVITD3, TESTOFREE, TESTOSTERONE  Inflammation (CRP: Acute Phase) (ESR: Chronic Phase) No results found for: CRP, ESRSEDRATE, LATICACIDVEN       Note: Above Lab results reviewed.   Assessment  The primary encounter diagnosis was Sacroiliac joint pain. Diagnoses of SI joint arthritis and Chronic pain syndrome were also pertinent to this visit.  Plan of Care   Do not recommend repeating sacroiliac joint or piriformis injection, negative diagnostic intervention. Continue with medication management.  Follow-up as scheduled for medication refill later this month.  Follow-up plan:   Return for Keep sch. appt.     Bilateral SIJ/ piriformis 01/15/21    Recent Visits Date Type Provider Dept  01/15/21 Procedure visit Gillis Santa, MD [facility]  01/07/21 Office Visit Gillis Santa, MD [facility]  11/26/20 Office Visit Gillis Santa, MD [facility]  Showing recent visits within past 90 days and meeting all other requirements Today's Visits Date Type Provider Dept  02/24/21 Telemedicine Gillis Santa, MD [facility]  Showing today's visits and meeting all other requirements Future Appointments Date Type Provider Dept  03/18/21 Appointment Gillis Santa, MD [facility]  Showing future appointments within next 90 days and meeting all other requirements I discussed the assessment and treatment plan with the patient. The patient was provided an opportunity to ask questions and all were answered. The patient agreed with the plan and demonstrated an understanding of the instructions.  Patient advised to call back or seek an in-person evaluation if the symptoms or condition worsens.  Duration of encounter: 66mnutes.  Note by: BGillis Santa MD Date: 02/24/2021; Time: 1:06 PM

## 2021-03-18 ENCOUNTER — Other Ambulatory Visit: Payer: Self-pay

## 2021-03-18 ENCOUNTER — Encounter: Payer: Self-pay | Admitting: Student in an Organized Health Care Education/Training Program

## 2021-03-18 ENCOUNTER — Ambulatory Visit
Payer: Medicare Other | Attending: Student in an Organized Health Care Education/Training Program | Admitting: Student in an Organized Health Care Education/Training Program

## 2021-03-18 VITALS — BP 139/90 | HR 84 | Temp 96.8°F | Resp 16 | Ht 63.0 in | Wt 113.0 lb

## 2021-03-18 DIAGNOSIS — G894 Chronic pain syndrome: Secondary | ICD-10-CM

## 2021-03-18 DIAGNOSIS — M47816 Spondylosis without myelopathy or radiculopathy, lumbar region: Secondary | ICD-10-CM

## 2021-03-18 DIAGNOSIS — M47818 Spondylosis without myelopathy or radiculopathy, sacral and sacrococcygeal region: Secondary | ICD-10-CM | POA: Diagnosis not present

## 2021-03-18 DIAGNOSIS — G588 Other specified mononeuropathies: Secondary | ICD-10-CM | POA: Diagnosis present

## 2021-03-18 DIAGNOSIS — M533 Sacrococcygeal disorders, not elsewhere classified: Secondary | ICD-10-CM

## 2021-03-18 DIAGNOSIS — Z79891 Long term (current) use of opiate analgesic: Secondary | ICD-10-CM

## 2021-03-18 DIAGNOSIS — M5136 Other intervertebral disc degeneration, lumbar region: Secondary | ICD-10-CM | POA: Diagnosis not present

## 2021-03-18 DIAGNOSIS — M48062 Spinal stenosis, lumbar region with neurogenic claudication: Secondary | ICD-10-CM

## 2021-03-18 DIAGNOSIS — M48061 Spinal stenosis, lumbar region without neurogenic claudication: Secondary | ICD-10-CM

## 2021-03-18 MED ORDER — BUPRENORPHINE 20 MCG/HR TD PTWK: 1.0000 | MEDICATED_PATCH | TRANSDERMAL | 2 refills | Status: AC

## 2021-03-18 MED ORDER — TRAMADOL HCL 50 MG PO TABS: 50.0000 mg | ORAL_TABLET | Freq: Two times a day (BID) | ORAL | 2 refills | Status: DC

## 2021-03-18 MED ORDER — TRAMADOL HCL ER 100 MG PO TB24: 100.0000 mg | ORAL_TABLET | Freq: Every day | ORAL | 2 refills | Status: DC

## 2021-03-18 NOTE — Progress Notes (Signed)
Nursing Pain Medication Assessment:  Safety precautions to be maintained throughout the outpatient stay will include: orient to surroundings, keep bed in low position, maintain call bell within reach at all times, provide assistance with transfer out of bed and ambulation.  Medication Inspection Compliance: Sheila Newton did not comply with our request to bring her pills to be counted. She was reminded that bringing the medication bottles, even when empty, is a requirement.  Medication: None brought in. Pill/Patch Count: None available to be counted. Bottle Appearance: No container available. Did not bring bottle(s) to appointment. Filled Date: N/A Last Medication intake:  Today  Did not bring Tramadol, Tramadol ER, or Butrans patch

## 2021-03-18 NOTE — Progress Notes (Signed)
PROVIDER NOTE: Information contained herein reflects review and annotations entered in association with encounter. Interpretation of such information and data should be left to medically-trained personnel. Information provided to patient can be located elsewhere in the medical record under "Patient Instructions". Document created using STT-dictation technology, any transcriptional errors that may result from process are unintentional.    Patient: Sheila Newton  Service Category: E/M  Provider: Edward Jolly, MD  DOB: [birth year]  DOS: 03/18/2021  Specialty: Interventional Pain Management  MRN: [id number]  Setting: Ambulatory outpatient  PCP: Kandyce Rud, MD  Type: Established Patient    Referring Provider: Kandyce Rud, MD  Location: Office  Delivery: Face-to-face     HPI  [name], a 80 y.o. year old female, has Chronic radicular lumbar pain; Lumbar spondylosis; Lumbar degenerative disc disease; Pudendal neuralgia; Encounter for long-term opiate analgesic use; Spinal stenosis, lumbar region, with neurogenic claudication; Chronic pain syndrome; Small fiber neuropathy; Piriformis syndrome of both sides; Neuroforaminal stenosis of lumbar spine (L5/S1, severe bilateral); Pain management contract signed; Sacroiliac joint pain; and SI joint arthritis on their problem list.,is here today because of her Sacroiliac joint pain [M53.3]. Ms. Pamintuan primary complain today is Other (Neuropathy "all over") Last encounter: My last encounter with her was on 02/24/21 Pain Assessment: Severity of Chronic pain is reported as a 2 /10. Location: Other (Comment)  /n/a. Onset: More than a month ago. Quality: Sharp, Stabbing. Timing: Intermittent. Modifying factor(s): standing, medications. Vitals:  height is 5\' 3"  (1.6 m) and weight is 113 lb (51.3 kg). Her temporal temperature is 96.8 F (36 C) (abnormal). Her blood pressure is 139/90 and her pulse is 84. Her respiration is 16 and oxygen saturation is 98%.   Reason for  encounter: medication management.  No change in medical history since last visit.  Patient's pain is at baseline.  Patient continues multimodal pain regimen as prescribed.  States that it provides pain relief and improvement in functional status.   HPI from initial clinic note 11/26/20: [name] is a very pleasant 80 year old female with a history of lumbar facet arthropathy, lumbar spondylosis, pudendal neuralgia, small fiber neuropathy, spinal stenosis with neurogenic claudication, piriformis syndrome who presents for chronic pain management.  She was previously established with pain management in [facility].  She moved to [facility] approximately 2 years ago.  She has been seeing Dr. [place] at [facility].  She would like to transfer her pain management here as this is closer to her endograft [place] every 2 months is too long.  She is currently managed on Butrans patch 20 mcg an hour, tramadol 50 mg twice daily as needed for breakthrough pain, tramadol ER 100 mg nightly.  She has had multiple interventional procedures including lumbar epidural steroid injections, sacral lateral branch nerve blocks, as well as pudendal nerve blocks with limited response.  She has read about spinal cord stimulation but states that she is not ready to pursue this yet.  She would like to have her Butrans patch, tramadol continued at this clinic.  No issues with medication compliance or intake, or overutilization or diversion.  Pharmacotherapy Assessment  Analgesic: Butrans patch 20 mcg an hour, tramadol 50 mg twice daily as needed for breakthrough pain, tramadol ER 100 mg nightly   Monitoring: [place] PMP: PDMP reviewed during this encounter.       Pharmacotherapy: No side-effects or adverse reactions reported. Compliance: No problems identified. Effectiveness: Clinically acceptable.  [facility], RN  03/18/2021 10:34 AM  Sign when Signing Visit  Nursing Pain Medication Assessment:  Safety  precautions to be maintained throughout the outpatient stay will include: orient to surroundings, keep bed in low position, maintain call bell within reach at all times, provide assistance with transfer out of bed and ambulation.  Medication Inspection Compliance: Ms. Evans did not comply with our request to bring her pills to be counted. She was reminded that bringing the medication bottles, even when empty, is a requirement.  Medication: None brought in. Pill/Patch Count: None available to be counted. Bottle Appearance: No container available. Did not bring bottle(s) to appointment. Filled Date: N/A Last Medication intake:  Today  Did not bring Tramadol, Tramadol ER, or Butrans patch      UDS:  Summary  Date Value Ref Range Status  11/26/2020 Note  Final    Comment:    ==================================================================== Compliance Drug Analysis, Ur ==================================================================== Test                             Result       Flag       Units  Drug Present and Declared for Prescription Verification   Buprenorphine                  23           EXPECTED   ng/mg creat   Norbuprenorphine               22           EXPECTED   ng/mg creat    Source of buprenorphine is a scheduled prescription medication.    Norbuprenorphine is an expected metabolite of buprenorphine.    Tramadol                       >3165        EXPECTED   ng/mg creat   O-Desmethyltramadol            >3165        EXPECTED   ng/mg creat   N-Desmethyltramadol            >3165        EXPECTED   ng/mg creat    Source of tramadol is a prescription medication. O-desmethyltramadol    and N-desmethyltramadol are expected metabolites of tramadol.  Drug Absent but Declared for Prescription Verification   Salicylate                     Not Detected UNEXPECTED    Aspirin, as indicated in the declared medication list, is not always    detected even when used as  directed.  ==================================================================== Test                      Result    Flag   Units      Ref Range   Creatinine              158              mg/dL      >=53 ==================================================================== Declared Medications:  The flagging and interpretation on this report are based on the  following declared medications.  Unexpected results may arise from  inaccuracies in the declared medications.   **Note: The testing scope of this panel includes these medications:   Tramadol   **Note: The testing scope of this panel does not include small to  moderate  amounts of these reported medications:   Aspirin  Buprenorphine Patch (BuTrans)   **Note: The testing scope of this panel does not include the  following reported medications:   Atorvastatin  Cyanocobalamin  Estradiol  Lubiprostone  Magnesium  Multivitamin  Pantoprazole  Pyridoxine  Ubiquinone (CoQ10)  Vitamin E ==================================================================== For clinical consultation, please call [phone]. ====================================================================      ROS  Constitutional: Denies any fever or chills Gastrointestinal: No reported hemesis, hematochezia, vomiting, or acute GI distress Musculoskeletal: Denies any acute onset joint swelling, redness, loss of ROM, or weakness Neurological:  LE neuropathic pain  Medication Review  Aspirin, Coenzyme Q10-Vitamin E, Cyanocobalamin, Magnesium, Multivitamin Adult, Pyridoxine HCl, atorvastatin, buprenorphine, estradiol, lubiprostone, pantoprazole, and traMADol  History Review  Allergy: Ms. Snead is allergic to ilosone [erythromycin]. Drug: Ms. Reitman  reports no history of drug use. Alcohol:  reports no history of alcohol use. Tobacco:  reports that she has never smoked. She has never been exposed to tobacco smoke. She has never used smokeless  tobacco. Social: Ms. Boback  reports that she has never smoked. She has never been exposed to tobacco smoke. She has never used smokeless tobacco. She reports that she does not drink alcohol and does not use drugs. Medical:  has a past medical history of Small fiber polyneuropathy and Spondylolysis. Surgical: Ms. Bonnette  has a past surgical history that includes back pain and Abdominal hysterectomy. Family: family history is not on file.   Physical Exam  General appearance: Well nourished, well developed, and well hydrated. In no apparent acute distress Mental status: Alert, oriented x 3 (person, place, & time)       Respiratory: No evidence of acute respiratory distress Eyes: PERLA Vitals: BP 139/90   Pulse 84   Temp (!) 96.8 F (36 C) (Temporal)   Resp 16   Ht 5\' 3"  (1.6 m)   Wt 113 lb (51.3 kg)   SpO2 98%   BMI 20.02 kg/m  BMI: Estimated body mass index is 20.02 kg/m as calculated from the following:   Height as of this encounter: 5\' 3"  (1.6 m).   Weight as of this encounter: 113 lb (51.3 kg). Ideal: Ideal body weight: 52.4 kg (115 lb 8.3 oz)      Lumbar Spine Area Exam  Skin & Axial Inspection: No masses, redness, or swelling Alignment: Symmetrical Functional ROM: Pain restricted ROM       Stability: No instability detected Muscle Tone/Strength: Functionally intact. No obvious neuro-muscular anomalies detected. Sensory (Neurological): Dermatomal pain pattern  Gait & Posture Assessment  Ambulation: Unassisted Gait: Relatively normal for age and body habitus Posture: WNL  Lower Extremity Exam      Side: Right lower extremity   Side: Left lower extremity  Stability: No instability observed           Stability: No instability observed          Skin & Extremity Inspection: Skin color, temperature, and hair growth are WNL. No peripheral edema or cyanosis. No masses, redness, swelling, asymmetry, or associated skin lesions. No contractures.   Skin & Extremity Inspection: Skin  color, temperature, and hair growth are WNL. No peripheral edema or cyanosis. No masses, redness, swelling, asymmetry, or associated skin lesions. No contractures.  Functional ROM: Unrestricted ROM                   Functional ROM: Unrestricted ROM  Muscle Tone/Strength: Functionally intact. No obvious neuro-muscular anomalies detected.   Muscle Tone/Strength: Functionally intact. No obvious neuro-muscular anomalies detected.  Sensory (Neurological): Neurogenic pain pattern         Sensory (Neurological): Neurogenic pain pattern        DTR: Patellar: deferred today Achilles: deferred today Plantar: deferred today   DTR: Patellar: deferred today Achilles: deferred today Plantar: deferred today  Palpation: No palpable anomalies   Palpation: No palpable anomalies    Assessment   Status Diagnosis  Controlled Controlled Controlled 1. Sacroiliac joint pain   2. SI joint arthritis   3. Lumbar spondylosis (severe, L4/5)   4. Lumbar degenerative disc disease   5. Encounter for long-term opiate analgesic use   6. Spinal stenosis, lumbar region, with neurogenic claudication   7. Pudendal neuralgia   8. Neuroforaminal stenosis of lumbar spine (L5/S1, severe bilateral)   9. Chronic pain syndrome      Plan of Care    [name] has a current medication list which includes the following long-term medication(s): atorvastatin, estradiol, and pantoprazole.  Pharmacotherapy (Medications Ordered): Meds ordered this encounter  Medications   traMADol (ULTRAM) 50 MG tablet    Sig: Take 1 tablet (50 mg total) by mouth 2 (two) times daily.    Dispense:  60 tablet    Refill:  2   traMADol (ULTRAM-ER) 100 MG 24 hr tablet    Sig: Take 1 tablet (100 mg total) by mouth at bedtime.    Dispense:  30 tablet    Refill:  2   buprenorphine (BUTRANS) 20 MCG/HR PTWK    Sig: Place 1 patch onto the skin once a week.    Dispense:  4 patch    Refill:  2    For chronic pain syndrome    No orders of the defined types were placed in this encounter.   Follow-up plan:   Return in about 3 months (around 06/18/2021) for Medication Management, in person.     Bilateral SIJ/ piriformis 01/15/21     Recent Visits Date Type Provider Dept  02/24/21 Telemedicine Edward Jolly, MD [facility]  01/15/21 Procedure visit Edward Jolly, MD [facility]  01/07/21 Office Visit Edward Jolly, MD [facility]  Showing recent visits within past 90 days and meeting all other requirements Today's Visits Date Type Provider Dept  03/18/21 Office Visit Edward Jolly, MD [facility]  Showing today's visits and meeting all other requirements Future Appointments Date Type Provider Dept  06/10/21 Appointment Edward Jolly, MD [facility]  Showing future appointments within next 90 days and meeting all other requirements I discussed the assessment and treatment plan with the patient. The patient was provided an opportunity to ask questions and all were answered. The patient agreed with the plan and demonstrated an understanding of the instructions.  Patient advised to call back or seek an in-person evaluation if the symptoms or condition worsens.  Duration of encounter: .  Note by: Edward Jolly, MD Date: 03/18/2021; Time: 11:03 AM

## 2021-06-10 ENCOUNTER — Encounter: Payer: Medicare Other | Admitting: Student in an Organized Health Care Education/Training Program

## 2021-06-17 ENCOUNTER — Ambulatory Visit
Payer: Medicare Other | Attending: Student in an Organized Health Care Education/Training Program | Admitting: Student in an Organized Health Care Education/Training Program

## 2021-06-17 ENCOUNTER — Encounter: Payer: Self-pay | Admitting: Student in an Organized Health Care Education/Training Program

## 2021-06-17 ENCOUNTER — Other Ambulatory Visit: Payer: Self-pay

## 2021-06-17 VITALS — BP 127/73 | HR 89 | Temp 98.6°F | Resp 16 | Ht 63.0 in | Wt 113.0 lb

## 2021-06-17 DIAGNOSIS — M5136 Other intervertebral disc degeneration, lumbar region: Secondary | ICD-10-CM | POA: Diagnosis not present

## 2021-06-17 DIAGNOSIS — G588 Other specified mononeuropathies: Secondary | ICD-10-CM

## 2021-06-17 DIAGNOSIS — M461 Sacroiliitis, not elsewhere classified: Secondary | ICD-10-CM

## 2021-06-17 DIAGNOSIS — M51369 Other intervertebral disc degeneration, lumbar region without mention of lumbar back pain or lower extremity pain: Secondary | ICD-10-CM

## 2021-06-17 DIAGNOSIS — M47816 Spondylosis without myelopathy or radiculopathy, lumbar region: Secondary | ICD-10-CM

## 2021-06-17 DIAGNOSIS — M533 Sacrococcygeal disorders, not elsewhere classified: Secondary | ICD-10-CM

## 2021-06-17 DIAGNOSIS — M47818 Spondylosis without myelopathy or radiculopathy, sacral and sacrococcygeal region: Secondary | ICD-10-CM

## 2021-06-17 DIAGNOSIS — G894 Chronic pain syndrome: Secondary | ICD-10-CM

## 2021-06-17 DIAGNOSIS — M48061 Spinal stenosis, lumbar region without neurogenic claudication: Secondary | ICD-10-CM

## 2021-06-17 DIAGNOSIS — Z79891 Long term (current) use of opiate analgesic: Secondary | ICD-10-CM

## 2021-06-17 DIAGNOSIS — M48062 Spinal stenosis, lumbar region with neurogenic claudication: Secondary | ICD-10-CM

## 2021-06-17 MED ORDER — TRAMADOL HCL ER 100 MG PO TB24: 100.0000 mg | ORAL_TABLET | Freq: Every day | ORAL | 2 refills | Status: DC

## 2021-06-17 MED ORDER — BUPRENORPHINE 20 MCG/HR TD PTWK: 1.0000 | MEDICATED_PATCH | TRANSDERMAL | 2 refills | Status: DC

## 2021-06-17 MED ORDER — TRAMADOL HCL 50 MG PO TABS: 50.0000 mg | ORAL_TABLET | Freq: Two times a day (BID) | ORAL | 2 refills | Status: DC

## 2021-06-17 NOTE — Progress Notes (Signed)
PROVIDER NOTE: Information contained herein reflects review and annotations entered in association with encounter. Interpretation of such information and data should be left to medically-trained personnel. Information provided to patient can be located elsewhere in the medical record under "Patient Instructions". Document created using STT-dictation technology, any transcriptional errors that may result from process are unintentional.    Patient: Sheila Newton  Service Category: E/M  Provider: Edward Jolly, MD  DOB: June 21, 1940  DOS: 06/17/2021  Specialty: Interventional Pain Management  MRN: [id number]  Setting: Ambulatory outpatient  PCP: Sheila Rud, MD  Type: Established Patient    Referring Provider: Kandyce Rud, MD  Location: Office  Delivery: Face-to-face     HPI  [name], a 81 y.o. year old female, has Chronic radicular lumbar pain; Lumbar spondylosis; Lumbar degenerative disc disease; Pudendal neuralgia; Encounter for long-term opiate analgesic use; Spinal stenosis, lumbar region, with neurogenic claudication; Chronic pain syndrome; Small fiber neuropathy; Piriformis syndrome of both sides; Neuroforaminal stenosis of lumbar spine (L5/S1, severe bilateral); Pain management contract signed; Sacroiliac joint pain; and SI joint arthritis on their problem list.,is here today because of her Sacroiliac joint pain [M53.3]. Sheila Newton primary complain today is Fibromyalgia (Generalized pain. )  Last encounter: My last encounter with her was on 03/18/21 Pain Assessment: Severity of Chronic pain is reported as a 2 /10. Location: Other (Comment) (generalized aches and pains throughout body.) Other (Comment)/denies. Onset: More than a month ago. Quality: Discomfort, Constant, Burning, Sharp. Timing: Constant. Modifying factor(s): medications. Vitals:  height is 5\' 3"  (1.6 m) and weight is 113 lb (51.3 kg). Her temporal temperature is 98.6 F (37 C). Her blood pressure is 127/73 and her pulse is 89.  Her respiration is 16 and oxygen saturation is 98%.   Reason for encounter: medication management.  No change in medical history since last visit.  Patient's pain is at baseline.  Patient continues multimodal pain regimen as prescribed.  States that it provides pain relief and improvement in functional status.   HPI from initial clinic note 11/26/20: [name] is a very pleasant 81 year old female with a history of lumbar facet arthropathy, lumbar spondylosis, pudendal neuralgia, small fiber neuropathy, spinal stenosis with neurogenic claudication, piriformis syndrome who presents for chronic pain management.  She was previously established with pain management in [facility].  She moved to [place] approximately 2 years ago.  She has been seeing Sheila Newton at [facility].  She would like to transfer her pain management here as this is closer to her endograft Sheila Newton every 2 months is too long.  She is currently managed on Butrans patch 20 mcg an hour, tramadol 50 mg twice daily as needed for breakthrough pain, tramadol ER 100 mg nightly.  She has had multiple interventional procedures including lumbar epidural steroid injections, sacral lateral branch nerve blocks, as well as pudendal nerve blocks with limited response.  She has read about spinal cord stimulation but states that she is not ready to pursue this yet.  She would like to have her Butrans patch, tramadol continued at this clinic.  No issues with medication compliance or intake, or overutilization or diversion.  Pharmacotherapy Assessment  Analgesic: Butrans patch 20 mcg an hour, tramadol 50 mg twice daily as needed for breakthrough pain, tramadol ER 100 mg nightly   Monitoring: [place] PMP: PDMP reviewed during this encounter.       Pharmacotherapy: No side-effects or adverse reactions reported. Compliance: No problems identified. Effectiveness: Clinically acceptable.  Sheila Ammons, RN  06/17/2021  2:43 PM   Sign when Signing Visit Nursing Pain Medication Assessment:  Safety precautions to be maintained throughout the outpatient stay will include: orient to surroundings, keep bed in low position, maintain call bell within reach at all times, provide assistance with transfer out of bed and ambulation.  Medication Inspection Compliance: Pill count conducted under aseptic conditions, in front of the patient. Neither the pills nor the bottle was removed from the patient's sight at any time. Once count was completed pills were immediately returned to the patient in their original bottle.Pill count conducted under aseptic conditions, in front of the patient. Neither the pills nor the bottle was removed from the patient's sight at any time. Once count was completed pills were immediately returned to the patient in their original bottle.  Medication #1: Tramadol (Ultram) Pill/Patch Count:  3 of 60 pills remain Pill/Patch Appearance: Markings consistent with prescribed medication Bottle Appearance: Standard pharmacy container. Clearly labeled. Filled Date: 6612 / 28 / 2022 Last Medication intake:  Today  Medication #2:  Tramadol ER  Pill/Patch Count:  12 of 30 pills remain Pill/Patch Appearance: Markings consistent with prescribed medication Bottle Appearance: Standard pharmacy container. Clearly labeled. Filled Date: 01 / 03 / 2023 Last Medication intake:  Yesterday  Medication #3:  Butrans patch Pill/Patch Count:  empty box not brought in Pill/Patch Appearance: Markings consistent with prescribed medication Bottle Appearance: Standard pharmacy container. Clearly labeled.    UDS:  Summary  Date Value Ref Range Status  11/26/2020 Note  Final    Comment:    ==================================================================== Compliance Drug Analysis, Ur ==================================================================== Test                             Result       Flag       Units  Drug Present  and Declared for Prescription Verification   Buprenorphine                  23           EXPECTED   ng/mg creat   Norbuprenorphine               22           EXPECTED   ng/mg creat    Source of buprenorphine is a scheduled prescription medication.    Norbuprenorphine is an expected metabolite of buprenorphine.    Tramadol                       >3165        EXPECTED   ng/mg creat   O-Desmethyltramadol            >3165        EXPECTED   ng/mg creat   N-Desmethyltramadol            >3165        EXPECTED   ng/mg creat    Source of tramadol is a prescription medication. O-desmethyltramadol    and N-desmethyltramadol are expected metabolites of tramadol.  Drug Absent but Declared for Prescription Verification   Salicylate                     Not Detected UNEXPECTED    Aspirin, as indicated in the declared medication list, is not always    detected even when used as directed.  ==================================================================== Test  Result    Flag   Units      Ref Range   Creatinine              158              mg/dL      >=16 ==================================================================== Declared Medications:  The flagging and interpretation on this report are based on the  following declared medications.  Unexpected results may arise from  inaccuracies in the declared medications.   **Note: The testing scope of this panel includes these medications:   Tramadol   **Note: The testing scope of this panel does not include small to  moderate amounts of these reported medications:   Aspirin  Buprenorphine Patch (BuTrans)   **Note: The testing scope of this panel does not include the  following reported medications:   Atorvastatin  Cyanocobalamin  Estradiol  Lubiprostone  Magnesium  Multivitamin  Pantoprazole  Pyridoxine  Ubiquinone (CoQ10)  Vitamin E ==================================================================== For clinical  consultation, please call [phone]. ====================================================================      ROS  Constitutional: Denies any fever or chills Gastrointestinal: No reported hemesis, hematochezia, vomiting, or acute GI distress Musculoskeletal: Denies any acute onset joint swelling, redness, loss of ROM, or weakness Neurological:  LE neuropathic pain  Medication Review  Aspirin, Coenzyme Q10-Vitamin E, Cyanocobalamin, Magnesium, Multivitamin Adult, Pyridoxine HCl, atorvastatin, buprenorphine, denosumab, estradiol, lubiprostone, pantoprazole, sodium fluoride, and traMADol  History Review  Allergy: Ms. Briel is allergic to ilosone [erythromycin]. Drug: Ms. Slingerland  reports no history of drug use. Alcohol:  reports no history of alcohol use. Tobacco:  reports that she has never smoked. She has never been exposed to tobacco smoke. She has never used smokeless tobacco. Social: Ms. Geeting  reports that she has never smoked. She has never been exposed to tobacco smoke. She has never used smokeless tobacco. She reports that she does not drink alcohol and does not use drugs. Medical:  has a past medical history of Small fiber polyneuropathy and Spondylolysis. Surgical: Ms. Faas  has a past surgical history that includes back pain and Abdominal hysterectomy. Family: family history is not on file.   Physical Exam  General appearance: Well nourished, well developed, and well hydrated. In no apparent acute distress Mental status: Alert, oriented x 3 (person, place, & time)       Respiratory: No evidence of acute respiratory distress Eyes: PERLA Vitals: BP 127/73 (BP Location: Right Arm, Patient Position: Sitting, Cuff Size: Normal)    Pulse 89    Temp 98.6 F (37 C) (Temporal)    Resp 16    Ht 5\' 3"  (1.6 m)    Wt 113 lb (51.3 kg)    SpO2 98%    BMI 20.02 kg/m  BMI: Estimated body mass index is 20.02 kg/m as calculated from the following:   Height as of this encounter: 5\' 3"   (1.6 m).   Weight as of this encounter: 113 lb (51.3 kg). Ideal: Ideal body weight: 52.4 kg (115 lb 8.3 oz)      Lumbar Spine Area Exam  Skin & Axial Inspection: No masses, redness, or swelling Alignment: Symmetrical Functional ROM: Pain restricted ROM       Stability: No instability detected Muscle Tone/Strength: Functionally intact. No obvious neuro-muscular anomalies detected. Sensory (Neurological): Dermatomal pain pattern  Gait & Posture Assessment  Ambulation: Unassisted Gait: Relatively normal for age and body habitus Posture: WNL  Lower Extremity Exam      Side: Right lower extremity   Side:  Left lower extremity  Stability: No instability observed           Stability: No instability observed          Skin & Extremity Inspection: Skin color, temperature, and hair growth are WNL. No peripheral edema or cyanosis. No masses, redness, swelling, asymmetry, or associated skin lesions. No contractures.   Skin & Extremity Inspection: Skin color, temperature, and hair growth are WNL. No peripheral edema or cyanosis. No masses, redness, swelling, asymmetry, or associated skin lesions. No contractures.  Functional ROM: Unrestricted ROM                   Functional ROM: Unrestricted ROM                  Muscle Tone/Strength: Functionally intact. No obvious neuro-muscular anomalies detected.   Muscle Tone/Strength: Functionally intact. No obvious neuro-muscular anomalies detected.  Sensory (Neurological): Neurogenic pain pattern         Sensory (Neurological): Neurogenic pain pattern        DTR: Patellar: deferred today Achilles: deferred today Plantar: deferred today   DTR: Patellar: deferred today Achilles: deferred today Plantar: deferred today  Palpation: No palpable anomalies   Palpation: No palpable anomalies    Assessment   Status Diagnosis  Controlled Controlled Controlled 1. Sacroiliac joint pain   2. SI joint arthritis   3. Lumbar spondylosis (severe, L4/5)   4.  Lumbar degenerative disc disease   5. Encounter for long-term opiate analgesic use   6. Spinal stenosis, lumbar region, with neurogenic claudication   7. Pudendal neuralgia   8. Neuroforaminal stenosis of lumbar spine (L5/S1, severe bilateral)   9. Chronic pain syndrome      Plan of Care    [name] has a current medication list which includes the following long-term medication(s): atorvastatin, estradiol, and pantoprazole.  Pharmacotherapy (Medications Ordered): Meds ordered this encounter  Medications   traMADol (ULTRAM-ER) 100 MG 24 hr tablet    Sig: Take 1 tablet (100 mg total) by mouth at bedtime.    Dispense:  30 tablet    Refill:  2   traMADol (ULTRAM) 50 MG tablet    Sig: Take 1 tablet (50 mg total) by mouth 2 (two) times daily.    Dispense:  60 tablet    Refill:  2   buprenorphine (BUTRANS) 20 MCG/HR PTWK    Sig: Place 1 patch onto the skin once a week. For chronic pain syndrome    Dispense:  4 patch    Refill:  2   No orders of the defined types were placed in this encounter.    Follow-up plan:   Return in about 3 months (around 09/15/2021) for Medication Management, in person.     Bilateral SIJ/ piriformis 01/15/21     Recent Visits No visits were found meeting these conditions. Showing recent visits within past 90 days and meeting all other requirements Today's Visits Date Type Provider Dept  06/17/21 Office Visit Sheila Jolly, MD [facility]  Showing today's visits and meeting all other requirements Future Appointments Date Type Provider Dept  08/28/21 Appointment Sheila Jolly, MD [facility]  Showing future appointments within next 90 days and meeting all other requirements  I discussed the assessment and treatment plan with the patient. The patient was provided an opportunity to ask questions and all were answered. The patient agreed with the plan and demonstrated an understanding of the instructions.  Patient advised to call  back  or seek an in-person evaluation if the symptoms or condition worsens.  Duration of encounter: 30minutes.  Note by: Sheila JollyBilal Woodroe Vogan, MD Date: 06/17/2021; Time: 3:14 PM

## 2021-06-17 NOTE — Progress Notes (Signed)
Nursing Pain Medication Assessment:  Safety precautions to be maintained throughout the outpatient stay will include: orient to surroundings, keep bed in low position, maintain call bell within reach at all times, provide assistance with transfer out of bed and ambulation.  Medication Inspection Compliance: Pill count conducted under aseptic conditions, in front of the patient. Neither the pills nor the bottle was removed from the patient's sight at any time. Once count was completed pills were immediately returned to the patient in their original bottle.Pill count conducted under aseptic conditions, in front of the patient. Neither the pills nor the bottle was removed from the patient's sight at any time. Once count was completed pills were immediately returned to the patient in their original bottle.  Medication #1: Tramadol (Ultram) Pill/Patch Count:  3 of 60 pills remain Pill/Patch Appearance: Markings consistent with prescribed medication Bottle Appearance: Standard pharmacy container. Clearly labeled. Filled Date: 69 / 28 / 2022 Last Medication intake:  Today  Medication #2:  Tramadol ER  Pill/Patch Count:  12 of 30 pills remain Pill/Patch Appearance: Markings consistent with prescribed medication Bottle Appearance: Standard pharmacy container. Clearly labeled. Filled Date: 01 / 03 / 2023 Last Medication intake:  Yesterday  Medication #3:  Butrans patch Pill/Patch Count:  empty box not brought in Pill/Patch Appearance: Markings consistent with prescribed medication Bottle Appearance: Standard pharmacy container. Clearly labeled.

## 2021-07-07 ENCOUNTER — Other Ambulatory Visit: Payer: Self-pay | Admitting: Family Medicine

## 2021-07-07 DIAGNOSIS — Z1231 Encounter for screening mammogram for malignant neoplasm of breast: Secondary | ICD-10-CM

## 2021-08-14 ENCOUNTER — Ambulatory Visit
Admission: RE | Admit: 2021-08-14 | Discharge: 2021-08-14 | Disposition: A | Discharge status: Home / Self Care | Payer: Medicare Other | Source: Ambulatory Visit | Attending: Family Medicine | Admitting: Family Medicine

## 2021-08-14 ENCOUNTER — Other Ambulatory Visit: Payer: Self-pay

## 2021-08-14 DIAGNOSIS — Z1231 Encounter for screening mammogram for malignant neoplasm of breast: Secondary | ICD-10-CM | POA: Insufficient documentation

## 2021-08-28 ENCOUNTER — Other Ambulatory Visit: Payer: Self-pay

## 2021-08-28 ENCOUNTER — Ambulatory Visit
Payer: Medicare Other | Attending: Student in an Organized Health Care Education/Training Program | Admitting: Student in an Organized Health Care Education/Training Program

## 2021-08-28 ENCOUNTER — Encounter: Payer: Self-pay | Admitting: Student in an Organized Health Care Education/Training Program

## 2021-08-28 VITALS — BP 135/71 | HR 96 | Temp 96.9°F | Resp 16 | Ht 63.0 in | Wt 115.0 lb

## 2021-08-28 DIAGNOSIS — M48061 Spinal stenosis, lumbar region without neurogenic claudication: Secondary | ICD-10-CM

## 2021-08-28 DIAGNOSIS — G894 Chronic pain syndrome: Secondary | ICD-10-CM

## 2021-08-28 DIAGNOSIS — M461 Sacroiliitis, not elsewhere classified: Secondary | ICD-10-CM

## 2021-08-28 DIAGNOSIS — M5136 Other intervertebral disc degeneration, lumbar region: Secondary | ICD-10-CM | POA: Diagnosis present

## 2021-08-28 DIAGNOSIS — Z79891 Long term (current) use of opiate analgesic: Secondary | ICD-10-CM

## 2021-08-28 DIAGNOSIS — M47818 Spondylosis without myelopathy or radiculopathy, sacral and sacrococcygeal region: Secondary | ICD-10-CM | POA: Diagnosis present

## 2021-08-28 DIAGNOSIS — M48062 Spinal stenosis, lumbar region with neurogenic claudication: Secondary | ICD-10-CM | POA: Diagnosis present

## 2021-08-28 DIAGNOSIS — M533 Sacrococcygeal disorders, not elsewhere classified: Secondary | ICD-10-CM | POA: Diagnosis present

## 2021-08-28 DIAGNOSIS — M51369 Other intervertebral disc degeneration, lumbar region without mention of lumbar back pain or lower extremity pain: Secondary | ICD-10-CM

## 2021-08-28 DIAGNOSIS — M47816 Spondylosis without myelopathy or radiculopathy, lumbar region: Secondary | ICD-10-CM | POA: Diagnosis present

## 2021-08-28 MED ORDER — TRAMADOL HCL 50 MG PO TABS: 50.0000 mg | ORAL_TABLET | Freq: Two times a day (BID) | ORAL | 2 refills | Status: DC

## 2021-08-28 MED ORDER — TRAMADOL HCL ER 100 MG PO TB24: 100.0000 mg | ORAL_TABLET | Freq: Every day | ORAL | 2 refills | Status: DC

## 2021-08-28 MED ORDER — BUPRENORPHINE 20 MCG/HR TD PTWK: 1.0000 | MEDICATED_PATCH | TRANSDERMAL | 2 refills | Status: DC

## 2021-08-28 NOTE — Progress Notes (Signed)
Nursing Pain Medication Assessment:  ?Safety precautions to be maintained throughout the outpatient stay will include: orient to surroundings, keep bed in low position, maintain call bell within reach at all times, provide assistance with transfer out of bed and ambulation.  ?Medication Inspection Compliance:  patches  ? ?Medication #1: Buprenorphine (Suboxone) ?Pill/Patch Count:  2 of 4 pills remain ?Pill/Patch Appearance: Markings consistent with prescribed medication ?Bottle Appearance: Standard pharmacy container. Clearly labeled. ?Filled Date: 03 / 22 / 2023 ?Last Medication intake:   last Friday  ? ?Medication #2: Tramadol (Ultram) ?Pill/Patch Count:  25/ of 30 pills remain ?Pill/Patch Appearance: Markings consistent with prescribed medication ?Bottle Appearance: Standard pharmacy container. Clearly labeled. ?Filled Date: 04 / 04 / 2023 ?Last Medication intake:  Yesterday ? ?Medication #3: Tramadol (Ultram) ?Pill/Patch Count:  40 of 60 pills remain ?Pill/Patch Appearance: Markings consistent with prescribed medication ?Bottle Appearance: Standard pharmacy container. Clearly labeled. ?Filled Date: 03 / 27 / 2023 ?Last Medication intake:  Today ?

## 2021-08-28 NOTE — Progress Notes (Signed)
PROVIDER NOTE: Information contained herein reflects review and annotations entered in association with encounter. Interpretation of such information and data should be left to medically-trained personnel. Information provided to patient can be located elsewhere in the medical record under "Patient Instructions". Document created using STT-dictation technology, any transcriptional errors that may result from process are unintentional.  ?  ?Patient: Sheila Newton  Service Category: E/M  Provider: Edward JollyBilal Isaah Furry, MD  ?DOB: 11-Aug-1940  DOS: 08/28/2021  Specialty: Interventional Pain Management  ?MRN: [id number]  Setting: Ambulatory outpatient  PCP: Kandyce RudBabaoff, Marcus, MD  ?Type: Established Patient    Referring Provider: Kandyce RudBabaoff, Marcus, MD  ?Location: Office  Delivery: Face-to-face    ? ?HPI  ?Sheila Newton, a 81 y.o. year old female, has Chronic radicular lumbar pain; Lumbar spondylosis; Lumbar degenerative disc disease; Pudendal neuralgia; Encounter for long-term opiate analgesic use; Spinal stenosis, lumbar region, with neurogenic claudication; Chronic pain syndrome; Small fiber neuropathy; Piriformis syndrome of both sides; Neuroforaminal stenosis of lumbar spine (L5/S1, severe bilateral); Pain management contract signed; Sacroiliac joint pain; and SI joint arthritis on their problem list.,is here today because of her Sacroiliac joint pain [M53.3]. Sheila Newton's primary complain today is Other (Allodynia  has pain all over her body ) ? ?Last encounter: My last encounter with her was on 06/17/21 ?Pain Assessment: Severity of Chronic pain is reported as a 2 /10. Location: Other (Comment) (all over pain) Other (Comment)/denies. Onset: More than a month ago. Quality: Discomfort, Constant, Burning, Throbbing. Timing: Constant. Modifying factor(s): medications, rest, dependant on where the pain is.Marland Kitchen. ?Vitals:  height is 5\' 3"  (1.6 m) and weight is 115 lb (52.2 kg). Her temporal temperature is 96.9 ?F (36.1 ?C) (abnormal). Her blood  pressure is 135/71 and her pulse is 96. Her respiration is 16 and oxygen saturation is 97%.  ? ?Reason for encounter: medication management. ? ?No change in medical history since last visit.  Patient's pain is at baseline.  Patient continues multimodal pain regimen as prescribed.  States that it provides pain relief and improvement in functional status. ? ?HPI from initial clinic note 11/26/20: ?Sheila Newton is a very pleasant 81 year old female with a history of lumbar facet arthropathy, lumbar spondylosis, pudendal neuralgia, small fiber neuropathy, spinal stenosis with neurogenic claudication, piriformis syndrome who presents for chronic pain management.  [place] [facility] [place] [facility] [place]  [place] ago.  She has been seeing [facility] Mar[facility]conaGilmore [facility].  She would like to transfer her pain management here as this is closer to her endograft [place] every 2 months is too long.  She is currently managed on Butrans patch 20 mcg an hour, tramadol 50 mg twice daily as needed for breakthrough pain, tramadol ER 100 mg nightly.  She has had multiple interventional procedures including lumbar epidural steroid injections, sacral lateral branch nerve blocks, as well as pudendal nerve blocks with limited response.  She has read about spinal cord stimulation but states that she is not ready to pursue this yet.  She would like to have her Butrans patch, tramadol continued at this clinic.  No issues with medication compliance or intake, or overutilization or diversion. ? ?Pharmacotherapy Assessment  ?Analgesic: Butrans patch 20 mcg an hour, tramadol 50 mg twice daily as needed for breakthrough pain, tramadol ER 100 mg nightly  ? ?Monitoring: ?[place] PMP: PDMP reviewed during this encounter.       ?Pharmacotherapy: No side-effects or adverse reactions reported. ?Compliance: No problems identified. ?  Effectiveness: Clinically acceptable. ? ?[name], RN  08/28/2021  1:10 PM  Sign when Signing Visit ?Nursing Pain Medication Assessment:  ?Safety precautions to be maintained throughout the outpatient stay will include: orient to surroundings, keep bed in low position, maintain call bell within reach at all times, provide assistance with transfer out of bed and ambulation.  ?Medication Inspection Compliance:  patches  ? ?Medication #1: Buprenorphine (Suboxone) ?Pill/Patch Count:  2 of 4 pills remain ?Pill/Patch Appearance: Markings consistent with prescribed medication ?Bottle Appearance: Standard pharmacy container. Clearly labeled. ?Filled Date: 03 / 22 / 2023 ?Last Medication intake:   last Friday  ? ?Medication #2: Tramadol (Ultram) ?Pill/Patch Count:  25/ of 30 pills remain ?Pill/Patch Appearance: Markings consistent with prescribed medication ?Bottle Appearance: Standard pharmacy container. Clearly labeled. ?Filled Date: 04 / 04 / 2023 ?Last Medication intake:  Yesterday ? ?Medication #3: Tramadol (Ultram) ?Pill/Patch Count:  40 of 60 pills remain ?Pill/Patch Appearance: Markings consistent with prescribed medication ?Bottle Appearance: Standard pharmacy container. Clearly labeled. ?Filled Date: 03 / 27 / 2023 ?Last Medication intake:  Today ?  ? ?  UDS:  ?Summary  ?Date Value Ref Range Status  ?11/26/2020 Note  Final  ?  Comment:  ?  ==================================================================== ?Compliance Drug Analysis, Ur ?==================================================================== ?Test                             Result       Flag       Units ? ?Drug Present and Declared for Prescription Verification ?  Buprenorphine                  23           EXPECTED   ng/mg creat ?  Norbuprenorphine               22           EXPECTED   ng/mg creat ?   Source of buprenorphine is a scheduled prescription medication. ?   Norbuprenorphine is an expected metabolite of buprenorphine. ? ?  Tramadol                       >3165        EXPECTED    ng/mg creat ?  O-Desmethyltramadol            >3165        EXPECTED   ng/mg creat ?  N-Desmethyltramadol            >3165        EXPECTED   ng/mg creat ?   Source of tramadol is a prescription medication. O-desmethyltramadol ?   and N-desmethyltramadol are expected metabolites of tramadol. ? ?Drug Absent but Declared for Prescription Verification ?  Salicylate                     Not Detected UNEXPECTED ?   Aspirin, as indicated in the declared medication list, is not always ?   detected even when used as directed. ? ?==================================================================== ?Test                      Result    Flag   Units      Ref Range ?  Creatinine              158  mg/dL      >=60 ?==================================================================== ?Declared Medications: ? The flagging and interpretation on this report are based on the ? following declared medications.  Unexpected results may arise from ? inaccuracies in the declared medications. ? ? **Note: The testing scope of this panel includes these medications: ? ? Tramadol ? ? **Note: The testing scope of this panel does not include small to ? moderate amounts of these reported medications: ? ? Aspirin ? Buprenorphine Patch (BuTrans) ? ? **Note: The testing scope of this panel does not include the ? following reported medications: ? ? Atorvastatin ? Cyanocobalamin ? Estradiol ? Lubiprostone ? Magnesium ? Multivitamin ? Pantoprazole ? Pyridoxine ? Ubiquinone (CoQ10) ? Vitamin E ?==================================================================== ?For clinical consultation, please call [phone]. ?==================================================================== ?  ?  ? ?ROS  ?Constitutional: Denies any fever or chills ?Gastrointestinal: No reported hemesis, hematochezia, vomiting, or acute GI distress ?Musculoskeletal: Denies any acute onset joint swelling, redness, loss of ROM, or weakness ?Neurological:  LE neuropathic  pain ? ?Medication Review  ?Aspirin, Coenzyme Q10-Vitamin E, Cyanocobalamin, Magnesium, Multivitamin Adult, Pyridoxine HCl, atorvastatin, buprenorphine, denosumab, estradiol, lubiprostone, pantoprazole, sodi

## 2021-08-28 NOTE — Patient Instructions (Signed)
Consider alpha lipoic acid 600 mg- 1000 mg at night, this can be  picked up OTC ?

## 2021-10-08 ENCOUNTER — Telehealth: Payer: Self-pay | Admitting: Student in an Organized Health Care Education/Training Program

## 2021-10-08 NOTE — Telephone Encounter (Signed)
PA for Butrans patch submitted. Patient notified. ?

## 2021-10-08 NOTE — Telephone Encounter (Signed)
Pharmacy called stating the patient need PA for her pain med. She is trying to leave tomorrow for vacation. Cant this be rushed thru? Please advise patient. She is very distraught. Also would like to get it filled today ?

## 2021-10-08 NOTE — Telephone Encounter (Signed)
Pt stated that she needs prior authorization to get her Buprenorphine patches. Also Pt said that she needs them before she goes out of town. Please call Pt and Pharmacy with an update. ?

## 2021-11-05 ENCOUNTER — Other Ambulatory Visit: Payer: Self-pay | Admitting: Student in an Organized Health Care Education/Training Program

## 2021-11-05 DIAGNOSIS — G894 Chronic pain syndrome: Secondary | ICD-10-CM

## 2021-11-05 DIAGNOSIS — Z79891 Long term (current) use of opiate analgesic: Secondary | ICD-10-CM

## 2021-11-05 DIAGNOSIS — M47816 Spondylosis without myelopathy or radiculopathy, lumbar region: Secondary | ICD-10-CM

## 2021-11-05 DIAGNOSIS — M48062 Spinal stenosis, lumbar region with neurogenic claudication: Secondary | ICD-10-CM

## 2021-11-17 ENCOUNTER — Telehealth: Payer: Self-pay | Admitting: Student in an Organized Health Care Education/Training Program

## 2021-11-17 NOTE — Telephone Encounter (Signed)
Attempted to call patient, left voicemail advising to clean skin, do not apply lotion, or try an alternative type of tape. I advised her to call back if any further questions.

## 2021-11-19 ENCOUNTER — Other Ambulatory Visit: Payer: Self-pay

## 2021-11-19 ENCOUNTER — Encounter: Payer: Self-pay | Admitting: Student in an Organized Health Care Education/Training Program

## 2021-11-19 ENCOUNTER — Ambulatory Visit
Payer: Medicare Other | Attending: Student in an Organized Health Care Education/Training Program | Admitting: Student in an Organized Health Care Education/Training Program

## 2021-11-19 VITALS — BP 137/86 | HR 97 | Temp 97.1°F | Resp 16 | Ht 62.0 in | Wt 115.0 lb

## 2021-11-19 DIAGNOSIS — M47818 Spondylosis without myelopathy or radiculopathy, sacral and sacrococcygeal region: Secondary | ICD-10-CM

## 2021-11-19 DIAGNOSIS — M48062 Spinal stenosis, lumbar region with neurogenic claudication: Secondary | ICD-10-CM | POA: Diagnosis present

## 2021-11-19 DIAGNOSIS — Z79891 Long term (current) use of opiate analgesic: Secondary | ICD-10-CM | POA: Diagnosis present

## 2021-11-19 DIAGNOSIS — M51369 Other intervertebral disc degeneration, lumbar region without mention of lumbar back pain or lower extremity pain: Secondary | ICD-10-CM

## 2021-11-19 DIAGNOSIS — M47816 Spondylosis without myelopathy or radiculopathy, lumbar region: Secondary | ICD-10-CM

## 2021-11-19 DIAGNOSIS — M5136 Other intervertebral disc degeneration, lumbar region: Secondary | ICD-10-CM | POA: Diagnosis present

## 2021-11-19 DIAGNOSIS — M533 Sacrococcygeal disorders, not elsewhere classified: Secondary | ICD-10-CM | POA: Diagnosis present

## 2021-11-19 DIAGNOSIS — M461 Sacroiliitis, not elsewhere classified: Secondary | ICD-10-CM

## 2021-11-19 DIAGNOSIS — G894 Chronic pain syndrome: Secondary | ICD-10-CM

## 2021-11-19 DIAGNOSIS — G588 Other specified mononeuropathies: Secondary | ICD-10-CM | POA: Diagnosis present

## 2021-11-19 DIAGNOSIS — M48061 Spinal stenosis, lumbar region without neurogenic claudication: Secondary | ICD-10-CM

## 2021-11-19 MED ORDER — TRAMADOL HCL ER 100 MG PO TB24: 100.0000 mg | ORAL_TABLET | Freq: Every day | ORAL | 2 refills | Status: DC

## 2021-11-19 MED ORDER — TRAMADOL HCL 50 MG PO TABS: 50.0000 mg | ORAL_TABLET | Freq: Two times a day (BID) | ORAL | 2 refills | Status: DC

## 2021-11-19 MED ORDER — BUPRENORPHINE 20 MCG/HR TD PTWK: 1.0000 | MEDICATED_PATCH | TRANSDERMAL | 2 refills | Status: DC

## 2021-11-19 NOTE — Progress Notes (Signed)
Patient is going to see her PCP for ongoing SOB and dizzy spells with activity

## 2021-11-19 NOTE — Progress Notes (Signed)
PROVIDER NOTE: Information contained herein reflects review and annotations entered in association with encounter. Interpretation of such information and data should be left to medically-trained personnel. Information provided to patient can be located elsewhere in the medical record under "Patient Instructions". Document created using STT-dictation technology, any transcriptional errors that may result from process are unintentional.    Patient: Sheila Newton  Service Category: E/M  Provider: Edward Jolly, MD  DOB: 11/27/1940  DOS: 11/19/2021  Specialty: Interventional Pain Management  MRN: [id number]  Setting: Ambulatory outpatient  PCP: Kandyce Rud, MD  Type: Established Patient    Referring Provider: Kandyce Rud, MD  Location: Office  Delivery: Face-to-face     HPI  Sheila Newton, a 81 y.o. year old female, has Chronic radicular lumbar pain; Lumbar spondylosis; Lumbar degenerative disc disease; Pudendal neuralgia; Encounter for long-term opiate analgesic use; Spinal stenosis, lumbar region, with neurogenic claudication; Chronic pain syndrome; Small fiber neuropathy; Piriformis syndrome of both sides; Neuroforaminal stenosis of lumbar spine (L5/S1, severe bilateral); Pain management contract signed; Sacroiliac joint pain; and SI joint arthritis on their problem list.,is here today because of her Sacroiliac joint pain [M53.3]. [name] primary complain today is Other (Generalized nerve pain, allodynia (skin hurts))  Last encounter: My last encounter with her was on 08/28/21 Pain Assessment: Severity of Chronic pain is reported as a 2 /10. Location: Other (Comment) (nerve pain generalized) Other (Comment)/denies. Onset: More than a month ago. Quality: Discomfort, Constant, Burning, Tingling, Numbness, Pins and needles. Timing: Constant. Modifying factor(s): medications, rest, changing positions frequently. Vitals:  height is 5\' 2"  (1.575 m) and weight is 115 lb (52.2 kg). Her temporal temperature is  97.1 F (36.2 C) (abnormal). Her blood pressure is 137/86 and her pulse is 97. Her respiration is 16 and oxygen saturation is 100%.   Reason for encounter: medication management.  No change in medical history since last visit.  Patient's pain is at baseline.  Patient continues multimodal pain regimen as prescribed.  States that it provides pain relief and improvement in functional status. She was having trouble with the patch adherence to her skin.  This is the first time this is happened.  I provided the patient with Tegaderms that she can apply over her patch to see if that helps with patch adherence.  HPI from initial clinic note 11/26/20: [name] is a very pleasant 81 year old female with a history of lumbar facet arthropathy, lumbar spondylosis, pudendal neuralgia, small fiber neuropathy, spinal stenosis with neurogenic claudication, piriformis syndrome who presents for chronic pain management.  She was previously established with pain management in [facility].  She moved to [facility] approximately 2 years ago.  She has been seeing [place]. [place] at [facility].  She would like to transfer her pain management here as this is closer to her endograft [place] every 2 months is too long.  She is currently managed on Butrans patch 20 mcg an hour, tramadol 50 mg twice daily as needed for breakthrough pain, tramadol ER 100 mg nightly.  She has had multiple interventional procedures including lumbar epidural steroid injections, sacral lateral branch nerve blocks, as well as pudendal nerve blocks with limited response.  She has read about spinal cord stimulation but states that she is not ready to pursue this yet.  She would like to have her Butrans patch, tramadol continued at this clinic.  No issues with medication compliance or intake, or overutilization or diversion.  Pharmacotherapy Assessment  Analgesic: Butrans patch 20 mcg an hour, tramadol 50  mg twice daily as needed for  breakthrough pain, tramadol ER 100 mg nightly   Monitoring: [place] PMP: PDMP reviewed during this encounter.       Pharmacotherapy: No side-effects or adverse reactions reported. Compliance: No problems identified. Effectiveness: Clinically acceptable.  [name], RN  11/19/2021  2:14 PM  Sign when Signing Visit Nursing Pain Medication Assessment:  Safety precautions to be maintained throughout the outpatient stay will include: orient to surroundings, keep bed in low position, maintain call bell within reach at all times, provide assistance with transfer out of bed and ambulation.  Medication Inspection Compliance: Pill count conducted under aseptic conditions, in front of the patient. Neither the pills nor the bottle was removed from the patient's sight at any time. Once count was completed pills were immediately returned to the patient in their original bottle.  Medication #1: Buprenorphine (Suboxone) Pill/Patch Count:  2 of 4 patches remain Pill/Patch Appearance: Markings consistent with prescribed medication Bottle Appearance: Standard pharmacy container. Clearly labeled. Filled Date: 04 / 18 / 2023 Last Medication intake:   last Friday   Medication #2: Tramadol (Ultram) Pill/Patch Count:  55 of 60 pills remain Pill/Patch Appearance: Markings consistent with prescribed medication Bottle Appearance: Standard pharmacy container. Clearly labeled. Filled Date: 05 / 24 / 2023 Last Medication intake:  Today  Medication #3:  Tramadol 100 mg  Pill/Patch Count:  6 of 30 pills remain Pill/Patch Appearance: Markings consistent with prescribed medication Bottle Appearance: No container available. Did not bring bottle(s) to appointment. Filled Date: 05 / 24 / 2023 Last Medication intake:  Wendie Simmer, RN  11/19/2021  2:01 PM  Sign when Signing Visit Patient is going to see her PCP for ongoing SOB and dizzy spells with activity     UDS:  Summary  Date Value Ref  Range Status  11/26/2020 Note  Final    Comment:    ==================================================================== Compliance Drug Analysis, Ur ==================================================================== Test                             Result       Flag       Units  Drug Present and Declared for Prescription Verification   Buprenorphine                  23           EXPECTED   ng/mg creat   Norbuprenorphine               22           EXPECTED   ng/mg creat    Source of buprenorphine is a scheduled prescription medication.    Norbuprenorphine is an expected metabolite of buprenorphine.    Tramadol                       >3165        EXPECTED   ng/mg creat   O-Desmethyltramadol            >3165        EXPECTED   ng/mg creat   N-Desmethyltramadol            >3165        EXPECTED   ng/mg creat    Source of tramadol is a prescription medication. O-desmethyltramadol    and N-desmethyltramadol are expected metabolites of tramadol.  Drug Absent but Declared for Prescription Verification  Salicylate                     Not Detected UNEXPECTED    Aspirin, as indicated in the declared medication list, is not always    detected even when used as directed.  ==================================================================== Test                      Result    Flag   Units      Ref Range   Creatinine              158              mg/dL      >=16 ==================================================================== Declared Medications:  The flagging and interpretation on this report are based on the  following declared medications.  Unexpected results may arise from  inaccuracies in the declared medications.   **Note: The testing scope of this panel includes these medications:   Tramadol   **Note: The testing scope of this panel does not include small to  moderate amounts of these reported medications:   Aspirin  Buprenorphine Patch (BuTrans)   **Note: The testing scope  of this panel does not include the  following reported medications:   Atorvastatin  Cyanocobalamin  Estradiol  Lubiprostone  Magnesium  Multivitamin  Pantoprazole  Pyridoxine  Ubiquinone (CoQ10)  Vitamin E ==================================================================== For clinical consultation, please call [phone]. ====================================================================      ROS  Constitutional: Denies any fever or chills Gastrointestinal: No reported hemesis, hematochezia, vomiting, or acute GI distress Musculoskeletal: Denies any acute onset joint swelling, redness, loss of ROM, or weakness Neurological:  LE neuropathic pain  Medication Review  Aspirin, Coenzyme Q10-Vitamin E, Cyanocobalamin, Magnesium, Multivitamin Adult, Pyridoxine HCl, atorvastatin, buprenorphine, denosumab, estradiol, lubiprostone, pantoprazole, sodium fluoride, and traMADol  History Review  Allergy: Ms. Bohac is allergic to ilosone [erythromycin]. Drug: Ms. Panozzo  reports no history of drug use. Alcohol:  reports no history of alcohol use. Tobacco:  reports that she has never smoked. She has never been exposed to tobacco smoke. She has never used smokeless tobacco. Social: Ms. Crocker  reports that she has never smoked. She has never been exposed to tobacco smoke. She has never used smokeless tobacco. She reports that she does not drink alcohol and does not use drugs. Medical:  has a past medical history of Small fiber polyneuropathy and Spondylolysis. Surgical: Ms. Sabia  has a past surgical history that includes back pain and Abdominal hysterectomy. Family: family history is not on file.   Physical Exam  General appearance: Well nourished, well developed, and well hydrated. In no apparent acute distress Mental status: Alert, oriented x 3 (person, place, & time)       Respiratory: No evidence of acute respiratory distress Eyes: PERLA Vitals: BP 137/86 (BP Location: Right  Arm, Patient Position: Sitting, Cuff Size: Normal)   Pulse 97   Temp (!) 97.1 F (36.2 C) (Temporal)   Resp 16   Ht 5\' 2"  (1.575 m)   Wt 115 lb (52.2 kg)   SpO2 100%   BMI 21.03 kg/m  BMI: Estimated body mass index is 21.03 kg/m as calculated from the following:   Height as of this encounter: 5\' 2"  (1.575 m).   Weight as of this encounter: 115 lb (52.2 kg). Ideal: Ideal body weight: 50.1 kg (110 lb 7.2 oz) Adjusted ideal body weight: 50.9 kg (112 lb 4.3 oz)      Lumbar Spine Area Exam  Skin & Axial Inspection: No masses, redness, or swelling Alignment: Symmetrical Functional ROM: Pain restricted ROM       Stability: No instability detected Muscle Tone/Strength: Functionally intact. No obvious neuro-muscular anomalies detected. Sensory (Neurological): Dermatomal pain pattern  Gait & Posture Assessment  Ambulation: Unassisted Gait: Relatively normal for age and body habitus Posture: WNL  Lower Extremity Exam      Side: Right lower extremity   Side: Left lower extremity  Stability: No instability observed           Stability: No instability observed          Skin & Extremity Inspection: Skin color, temperature, and hair growth are WNL. No peripheral edema or cyanosis. No masses, redness, swelling, asymmetry, or associated skin lesions. No contractures.   Skin & Extremity Inspection: Skin color, temperature, and hair growth are WNL. No peripheral edema or cyanosis. No masses, redness, swelling, asymmetry, or associated skin lesions. No contractures.  Functional ROM: Unrestricted ROM                   Functional ROM: Unrestricted ROM                  Muscle Tone/Strength: Functionally intact. No obvious neuro-muscular anomalies detected.   Muscle Tone/Strength: Functionally intact. No obvious neuro-muscular anomalies detected.  Sensory (Neurological): Neurogenic pain pattern         Sensory (Neurological): Neurogenic pain pattern        DTR: Patellar: deferred today Achilles:  deferred today Plantar: deferred today   DTR: Patellar: deferred today Achilles: deferred today Plantar: deferred today  Palpation: No palpable anomalies   Palpation: No palpable anomalies    Assessment   Status Diagnosis  Controlled Controlled Controlled 1. Sacroiliac joint pain   2. SI joint arthritis   3. Lumbar spondylosis (severe, L4/5)   4. Lumbar degenerative disc disease   5. Encounter for long-term opiate analgesic use   6. Spinal stenosis, lumbar region, with neurogenic claudication   7. Neuroforaminal stenosis of lumbar spine (L5/S1, severe bilateral)   8. Pudendal neuralgia   9. Chronic pain syndrome       Plan of Care    Sheila Newton has a current medication list which includes the following long-term medication(s): atorvastatin, estradiol, and pantoprazole.  Pharmacotherapy (Medications Ordered): Meds ordered this encounter  Medications   traMADol (ULTRAM) 50 MG tablet    Sig: Take 1 tablet (50 mg total) by mouth 2 (two) times daily. Ok for pt to fill up to 3 days earlier than fill date.    Dispense:  60 tablet    Refill:  2   buprenorphine (BUTRANS) 20 MCG/HR PTWK    Sig: Place 1 patch onto the skin once a week. For chronic pain syndrome. Ok for pt to fill up to 3 days earlier than fill date.    Dispense:  4 patch    Refill:  2   traMADol (ULTRAM-ER) 100 MG 24 hr tablet    Sig: Take 1 tablet (100 mg total) by mouth at bedtime. Ok for pt to fill up to 3 days earlier than fill date.    Dispense:  30 tablet    Refill:  2   No orders of the defined types were placed in this encounter.    Follow-up plan:   Return in about 3 months (around 02/19/2022) for Medication Management, in person.     Bilateral SIJ/ piriformis 01/15/21     Recent Visits Date Type  Provider Dept  08/28/21 Office Visit Edward JollyLateef, Lannah Koike, MD [facility]  Showing recent visits within past 90 days and meeting all other requirements Today's Visits Date Type Provider Dept   11/19/21 Office Visit Edward JollyLateef, Maisen Schmit, MD [facility]  Showing today's visits and meeting all other requirements Future Appointments No visits were found meeting these conditions. Showing future appointments within next 90 days and meeting all other requirements  I discussed the assessment and treatment plan with the patient. The patient was provided an opportunity to ask questions and all were answered. The patient agreed with the plan and demonstrated an understanding of the instructions.  Patient advised to call back or seek an in-person evaluation if the symptoms or condition worsens.  Duration of encounter: 30minutes.  Note by: Edward JollyBilal Itza Maniaci, MD Date: 11/19/2021; Time: 3:13 PM

## 2021-11-19 NOTE — Progress Notes (Signed)
Nursing Pain Medication Assessment:  Safety precautions to be maintained throughout the outpatient stay will include: orient to surroundings, keep bed in low position, maintain call bell within reach at all times, provide assistance with transfer out of bed and ambulation.  Medication Inspection Compliance: Pill count conducted under aseptic conditions, in front of the patient. Neither the pills nor the bottle was removed from the patient's sight at any time. Once count was completed pills were immediately returned to the patient in their original bottle.  Medication #1: Buprenorphine (Suboxone) Pill/Patch Count:  2 of 4 patches remain Pill/Patch Appearance: Markings consistent with prescribed medication Bottle Appearance: Standard pharmacy container. Clearly labeled. Filled Date: 04 / 18 / 2023 Last Medication intake:   last Friday   Medication #2: Tramadol (Ultram) Pill/Patch Count:  55 of 60 pills remain Pill/Patch Appearance: Markings consistent with prescribed medication Bottle Appearance: Standard pharmacy container. Clearly labeled. Filled Date: 05 / 24 / 2023 Last Medication intake:  Today  Medication #3:  Tramadol 100 mg  Pill/Patch Count:  6 of 30 pills remain Pill/Patch Appearance: Markings consistent with prescribed medication Bottle Appearance: No container available. Did not bring bottle(s) to appointment. Filled Date: 05 / 24 / 2023 Last Medication intake:  Yesterday

## 2021-11-21 ENCOUNTER — Telehealth: Payer: Self-pay | Admitting: Student in an Organized Health Care Education/Training Program

## 2021-11-21 NOTE — Telephone Encounter (Signed)
Patient states she talked with Nurse and Dr Cherylann Ratel on Thurs 6-29-23about her med refill coming up on 11-27-21. She will be out of town until 11-30-21. Can someone call the pharmacy to ok her to pick up her meds today? Please call patient.

## 2021-11-21 NOTE — Telephone Encounter (Signed)
Called [facility] and spoke with pharmacist and gave permission for her to fill on 11-22-2021 since she was going out of town.  I informed patient that the script would have to last 30 days from the fill date even though she was picking it up early.  Patient states understanding.

## 2022-02-12 ENCOUNTER — Encounter: Payer: Self-pay | Admitting: Student in an Organized Health Care Education/Training Program

## 2022-02-12 ENCOUNTER — Ambulatory Visit
Payer: Medicare Other | Attending: Student in an Organized Health Care Education/Training Program | Admitting: Student in an Organized Health Care Education/Training Program

## 2022-02-12 VITALS — BP 119/71 | HR 98 | Temp 97.0°F | Resp 16 | Ht 63.0 in | Wt 118.0 lb

## 2022-02-12 DIAGNOSIS — G894 Chronic pain syndrome: Secondary | ICD-10-CM | POA: Diagnosis present

## 2022-02-12 DIAGNOSIS — M47816 Spondylosis without myelopathy or radiculopathy, lumbar region: Secondary | ICD-10-CM | POA: Diagnosis present

## 2022-02-12 DIAGNOSIS — M47818 Spondylosis without myelopathy or radiculopathy, sacral and sacrococcygeal region: Secondary | ICD-10-CM | POA: Diagnosis present

## 2022-02-12 DIAGNOSIS — M48061 Spinal stenosis, lumbar region without neurogenic claudication: Secondary | ICD-10-CM | POA: Diagnosis present

## 2022-02-12 DIAGNOSIS — Z79891 Long term (current) use of opiate analgesic: Secondary | ICD-10-CM | POA: Insufficient documentation

## 2022-02-12 DIAGNOSIS — M533 Sacrococcygeal disorders, not elsewhere classified: Secondary | ICD-10-CM | POA: Insufficient documentation

## 2022-02-12 DIAGNOSIS — M48062 Spinal stenosis, lumbar region with neurogenic claudication: Secondary | ICD-10-CM | POA: Diagnosis present

## 2022-02-12 DIAGNOSIS — M5136 Other intervertebral disc degeneration, lumbar region: Secondary | ICD-10-CM | POA: Diagnosis present

## 2022-02-12 MED ORDER — TRAMADOL HCL 50 MG PO TABS: 50.0000 mg | ORAL_TABLET | Freq: Two times a day (BID) | ORAL | 2 refills | Status: DC

## 2022-02-12 MED ORDER — BUPRENORPHINE 20 MCG/HR TD PTWK: 1.0000 | MEDICATED_PATCH | TRANSDERMAL | 2 refills | Status: DC

## 2022-02-12 MED ORDER — TRAMADOL HCL ER 100 MG PO TB24: 100.0000 mg | ORAL_TABLET | Freq: Every day | ORAL | 2 refills | Status: DC

## 2022-02-12 NOTE — Progress Notes (Signed)
Nursing Pain Medication Assessment:  Safety precautions to be maintained throughout the outpatient stay will include: orient to surroundings, keep bed in low position, maintain call bell within reach at all times, provide assistance with transfer out of bed and ambulation.  Medication Inspection Compliance: Pill count conducted under aseptic conditions, in front of the patient. Neither the pills nor the bottle was removed from the patient's sight at any time. Once count was completed pills were immediately returned to the patient in their original bottle.  Medication:  butrans patch Pill/Patch Count:  2 of 4 pills remain Pill/Patch Appearance: Markings consistent with prescribed medication Bottle Appearance: Standard pharmacy container. Clearly labeled. Filled Date: 09 / 08 / 2023 Last Medication intake:  Today  Tramadol 50 mg 65/60 Filled 02-11-2022  Tramadol 100 mg 16/30 Filled 01/21/2022

## 2022-02-12 NOTE — Patient Instructions (Addendum)
Consider a mail order pharmacy to ensure inventory and supply Consider [facility] Also consider googling "mail order pharmacy"

## 2022-02-12 NOTE — Progress Notes (Signed)
PROVIDER NOTE: Information contained herein reflects review and annotations entered in association with encounter. Interpretation of such information and data should be left to medically-trained personnel. Information provided to patient can be located elsewhere in the medical record under "Patient Instructions". Document created using STT-dictation technology, any transcriptional errors that may result from process are unintentional.    Patient: Sheila Newton  Service Category: E/M  Provider: Edward JollyBilal Chrisann Melaragno, MD  DOB: 02-04-1941  DOS: 02/12/2022  Specialty: Interventional Pain Management  MRN: [id number]  Setting: Ambulatory outpatient  PCP: Kandyce RudBabaoff, Marcus, MD  Type: Established Patient    Referring Provider: Kandyce RudBabaoff, Marcus, MD  Location: Office  Delivery: Face-to-face     HPI  [name], a 81 y.o. year old female, has Chronic radicular lumbar pain; Lumbar spondylosis; Lumbar degenerative disc disease; Pudendal neuralgia; Encounter for long-term opiate analgesic use; Spinal stenosis, lumbar region, with neurogenic claudication; Chronic pain syndrome; Small fiber neuropathy; Piriformis syndrome of both sides; Neuroforaminal stenosis of lumbar spine (L5/S1, severe bilateral); Pain management contract signed; Sacroiliac joint pain; and SI joint arthritis on their problem list.,is here today because of her Sacroiliac joint pain [M53.3]. [name]'s primary complain today is nerve pain  Last encounter: My last encounter with her was on 11/19/21 Pain Assessment: Severity of Chronic pain is reported as a 2 /10. Location:    / . Onset: More than a month ago. Quality: Burning. Timing: Intermittent. Modifying factor(s): moving. Vitals:  height is 5\' 3"  (1.6 m) and weight is 118 lb (53.5 kg). Her temperature is 97 F (36.1 C) (abnormal). Her blood pressure is 119/71 and her pulse is 98. Her respiration is 16 and oxygen saturation is 98%.   Reason for encounter: medication management.  No change in medical history  since last visit.  Patient's pain is at baseline.  Patient continues multimodal pain regimen as prescribed.  States that it provides pain relief and improvement in functional status. She states that she has had trouble receiving medications in a timely manner from her pharmacy due to inventory issues.  I recommend that she consider a mail order pharmacy or our employee pharmacy.  HPI from initial clinic note 11/26/20: Sheila Newton is a very pleasant 81 year old female with a history of lumbar facet arthropathy, lumbar spondylosis, pudendal neuralgia, small fiber neuropathy, spinal stenosis with neurogenic claudication, piriformis syndrome who presents for chronic pain management.  [place] [place]  [place] ago.  She has been seeing [facility] Mar[facility]conaGilmore [facility].  She would like to transfer her pain management here as this is closer to her endograft [place] every 2 months is too long.  She is currently managed on Butrans patch 20 mcg an hour, tramadol 50 mg twice daily as needed for breakthrough pain, tramadol ER 100 mg nightly.  She has had multiple interventional procedures including lumbar epidural steroid injections, sacral lateral branch nerve blocks, as well as pudendal nerve blocks with limited response.  She has read about spinal cord stimulation but states that she is not ready to pursue this yet.  She would like to have her Butrans patch, tramadol continued at this clinic.  No issues with medication compliance or intake, or overutilization or diversion.  Pharmacotherapy Assessment  Analgesic: Butrans patch 20 mcg an hour, tramadol 50 mg twice daily as needed for breakthrough pain, tramadol ER 100 mg nightly   Monitoring: [place] PMP: PDMP reviewed during this encounter.  Pharmacotherapy: No side-effects or adverse reactions reported. Compliance: No problems identified. Effectiveness:  Clinically acceptable.  Dewayne Shorter, RN  02/12/2022  1:46 PM  Sign when Signing Visit Nursing Pain Medication Assessment:  Safety precautions to be maintained throughout the outpatient stay will include: orient to surroundings, keep bed in low position, maintain call bell within reach at all times, provide assistance with transfer out of bed and ambulation.  Medication Inspection Compliance: Pill count conducted under aseptic conditions, in front of the patient. Neither the pills nor the bottle was removed from the patient's sight at any time. Once count was completed pills were immediately returned to the patient in their original bottle.  Medication:  butrans patch Pill/Patch Count:  2 of 4 pills remain Pill/Patch Appearance: Markings consistent with prescribed medication Bottle Appearance: Standard pharmacy container. Clearly labeled. Filled Date: 09 / 08 / 2023 Last Medication intake:  Today  Tramadol 50 mg 65/60 Filled 02-11-2022  Tramadol 100 mg 16/30 Filled 01/21/2022    UDS:  Summary  Date Value Ref Range Status  11/26/2020 Note  Final    Comment:    ==================================================================== Compliance Drug Analysis, Ur ==================================================================== Test                             Result       Flag       Units  Drug Present and Declared for Prescription Verification   Buprenorphine                  23           EXPECTED   ng/mg creat   Norbuprenorphine               22           EXPECTED   ng/mg creat    Source of buprenorphine is a scheduled prescription medication.    Norbuprenorphine is an expected metabolite of buprenorphine.    Tramadol                       >3165        EXPECTED   ng/mg creat   O-Desmethyltramadol            >3165        EXPECTED   ng/mg creat   N-Desmethyltramadol            >3165        EXPECTED   ng/mg creat    Source of tramadol is a prescription medication.  O-desmethyltramadol    and N-desmethyltramadol are expected metabolites of tramadol.  Drug Absent but Declared for Prescription Verification   Salicylate                     Not Detected UNEXPECTED    Aspirin, as indicated in the declared medication list, is not always    detected even when used as directed.  ==================================================================== Test                      Result    Flag   Units      Ref Range   Creatinine              158              mg/dL      >=20 ==================================================================== Declared Medications:  The flagging and interpretation on this report  are based on the  following declared medications.  Unexpected results may arise from  inaccuracies in the declared medications.   **Note: The testing scope of this panel includes these medications:   Tramadol   **Note: The testing scope of this panel does not include small to  moderate amounts of these reported medications:   Aspirin  Buprenorphine Patch (BuTrans)   **Note: The testing scope of this panel does not include the  following reported medications:   Atorvastatin  Cyanocobalamin  Estradiol  Lubiprostone  Magnesium  Multivitamin  Pantoprazole  Pyridoxine  Ubiquinone (CoQ10)  Vitamin E ==================================================================== For clinical consultation, please call [phone]. ====================================================================      ROS  Constitutional: Denies any fever or chills Gastrointestinal: No reported hemesis, hematochezia, vomiting, or acute GI distress Musculoskeletal: Denies any acute onset joint swelling, redness, loss of ROM, or weakness Neurological:  LE neuropathic pain  Medication Review  Aspirin, Coenzyme Q10-Vitamin E, Cyanocobalamin, Magnesium, Multivitamin Adult, Pyridoxine HCl, atorvastatin, buprenorphine, denosumab, estradiol, lubiprostone, pantoprazole,  sodium fluoride, and traMADol  History Review  Allergy: Ms. Raben is allergic to sumatriptan, penicillin g, topiramate, and ilosone [erythromycin]. Drug: Ms. Luckow  reports no history of drug use. Alcohol:  reports no history of alcohol use. Tobacco:  reports that she has never smoked. She has never been exposed to tobacco smoke. She has never used smokeless tobacco. Social: Ms. Geary  reports that she has never smoked. She has never been exposed to tobacco smoke. She has never used smokeless tobacco. She reports that she does not drink alcohol and does not use drugs. Medical:  has a past medical history of Small fiber polyneuropathy and Spondylolysis. Surgical: Ms. Kisling  has a past surgical history that includes back pain and Abdominal hysterectomy. Family: family history is not on file.   Physical Exam  General appearance: Well nourished, well developed, and well hydrated. In no apparent acute distress Mental status: Alert, oriented x 3 (person, place, & time)       Respiratory: No evidence of acute respiratory distress Eyes: PERLA Vitals: BP 119/71   Pulse 98   Temp (!) 97 F (36.1 C)   Resp 16   Ht 5\' 3"  (1.6 m)   Wt 118 lb (53.5 kg)   SpO2 98%   BMI 20.90 kg/m  BMI: Estimated body mass index is 20.9 kg/m as calculated from the following:   Height as of this encounter: 5\' 3"  (1.6 m).   Weight as of this encounter: 118 lb (53.5 kg). Ideal: Ideal body weight: 52.4 kg (115 lb 8.3 oz) Adjusted ideal body weight: 52.8 kg (116 lb 8.2 oz)      Lumbar Spine Area Exam  Skin & Axial Inspection: No masses, redness, or swelling Alignment: Symmetrical Functional ROM: Pain restricted ROM       Stability: No instability detected Muscle Tone/Strength: Functionally intact. No obvious neuro-muscular anomalies detected. Sensory (Neurological): Dermatomal pain pattern  Gait & Posture Assessment  Ambulation: Unassisted Gait: Relatively normal for age and body habitus Posture: WNL   Lower Extremity Exam      Side: Right lower extremity   Side: Left lower extremity  Stability: No instability observed           Stability: No instability observed          Skin & Extremity Inspection: Skin color, temperature, and hair growth are WNL. No peripheral edema or cyanosis. No masses, redness, swelling, asymmetry, or associated skin lesions. No contractures.   Skin & Extremity Inspection:  Skin color, temperature, and hair growth are WNL. No peripheral edema or cyanosis. No masses, redness, swelling, asymmetry, or associated skin lesions. No contractures.  Functional ROM: Unrestricted ROM                   Functional ROM: Unrestricted ROM                  Muscle Tone/Strength: Functionally intact. No obvious neuro-muscular anomalies detected.   Muscle Tone/Strength: Functionally intact. No obvious neuro-muscular anomalies detected.  Sensory (Neurological): Neurogenic pain pattern         Sensory (Neurological): Neurogenic pain pattern        DTR: Patellar: deferred today Achilles: deferred today Plantar: deferred today   DTR: Patellar: deferred today Achilles: deferred today Plantar: deferred today  Palpation: No palpable anomalies   Palpation: No palpable anomalies    Assessment   Status Diagnosis  Controlled Controlled Controlled 1. Sacroiliac joint pain   2. SI joint arthritis   3. Lumbar spondylosis (severe, L4/5)   4. Lumbar degenerative disc disease   5. Neuroforaminal stenosis of lumbar spine (L5/S1, severe bilateral)   6. Chronic pain syndrome   7. Encounter for long-term opiate analgesic use   8. Spinal stenosis, lumbar region, with neurogenic claudication       Plan of Care    Ms. Sheila Key has a current medication list which includes the following long-term medication(s): atorvastatin, estradiol, and pantoprazole.  Pharmacotherapy (Medications Ordered): Meds ordered this encounter  Medications   traMADol (ULTRAM) 50 MG tablet    Sig: Take 1 tablet (50  mg total) by mouth 2 (two) times daily. Ok for pt to fill up to 3 days earlier than fill date.    Dispense:  60 tablet    Refill:  2   traMADol (ULTRAM-ER) 100 MG 24 hr tablet    Sig: Take 1 tablet (100 mg total) by mouth at bedtime. Ok for pt to fill up to 3 days earlier than fill date.    Dispense:  30 tablet    Refill:  2   buprenorphine (BUTRANS) 20 MCG/HR PTWK    Sig: Place 1 patch onto the skin once a week. For chronic pain syndrome. Ok for pt to fill up to 3 days earlier than fill date.    Dispense:  4 patch    Refill:  2   No orders of the defined types were placed in this encounter.    Follow-up plan:   Return in about 3 months (around 05/14/2022) for Medication Management, in person.     Bilateral SIJ/ piriformis 01/15/21     Recent Visits Date Type Provider Dept  11/19/21 Office Visit Edward Jolly, MD [facility]  Showing recent visits within past 90 days and meeting all other requirements Today's Visits Date Type Provider Dept  02/12/22 Office Visit Edward Jolly, MD [facility]  Showing today's visits and meeting all other requirements Future Appointments Date Type Provider Dept  05/12/22 Appointment Edward Jolly, MD [facility]  Showing future appointments within next 90 days and meeting all other requirements  I discussed the assessment and treatment plan with the patient. The patient was provided an opportunity to ask questions and all were answered. The patient agreed with the plan and demonstrated an understanding of the instructions.  Patient advised to call back or seek an in-person evaluation if the symptoms or condition worsens.  Duration of encounter: .  Note by: Edward Jolly, MD Date:  02/12/2022; Time: 2:46 PM

## 2022-02-13 ENCOUNTER — Other Ambulatory Visit: Payer: Self-pay

## 2022-02-16 ENCOUNTER — Other Ambulatory Visit: Payer: Self-pay

## 2022-02-17 ENCOUNTER — Other Ambulatory Visit: Payer: Self-pay

## 2022-02-17 ENCOUNTER — Other Ambulatory Visit: Payer: Self-pay | Admitting: *Deleted

## 2022-02-17 DIAGNOSIS — Z79891 Long term (current) use of opiate analgesic: Secondary | ICD-10-CM

## 2022-02-17 DIAGNOSIS — M48062 Spinal stenosis, lumbar region with neurogenic claudication: Secondary | ICD-10-CM

## 2022-02-17 DIAGNOSIS — G894 Chronic pain syndrome: Secondary | ICD-10-CM

## 2022-02-17 DIAGNOSIS — M47816 Spondylosis without myelopathy or radiculopathy, lumbar region: Secondary | ICD-10-CM

## 2022-02-17 MED ORDER — BUPRENORPHINE 20 MCG/HR TD PTWK
1.0000 | MEDICATED_PATCH | TRANSDERMAL | 2 refills | Status: DC
  Filled 2022-02-17: qty 4, 28d supply, fill #0
  Filled 2022-03-16 – 2022-03-18 (×2): qty 4, 28d supply, fill #1
  Filled 2022-04-17: qty 4, 28d supply, fill #2

## 2022-02-18 ENCOUNTER — Other Ambulatory Visit: Payer: Self-pay

## 2022-02-19 ENCOUNTER — Encounter: Payer: Medicare Other | Admitting: Student in an Organized Health Care Education/Training Program

## 2022-02-19 ENCOUNTER — Other Ambulatory Visit: Payer: Self-pay

## 2022-03-16 ENCOUNTER — Other Ambulatory Visit: Payer: Self-pay

## 2022-03-18 ENCOUNTER — Other Ambulatory Visit: Payer: Self-pay

## 2022-03-19 ENCOUNTER — Other Ambulatory Visit: Payer: Self-pay

## 2022-03-20 ENCOUNTER — Other Ambulatory Visit: Payer: Self-pay

## 2022-04-14 ENCOUNTER — Other Ambulatory Visit: Payer: Self-pay

## 2022-04-17 ENCOUNTER — Other Ambulatory Visit: Payer: Self-pay

## 2022-04-25 ENCOUNTER — Other Ambulatory Visit: Payer: Self-pay | Admitting: Student in an Organized Health Care Education/Training Program

## 2022-04-25 DIAGNOSIS — G894 Chronic pain syndrome: Secondary | ICD-10-CM

## 2022-04-25 DIAGNOSIS — Z79891 Long term (current) use of opiate analgesic: Secondary | ICD-10-CM

## 2022-04-25 DIAGNOSIS — M47816 Spondylosis without myelopathy or radiculopathy, lumbar region: Secondary | ICD-10-CM

## 2022-04-25 DIAGNOSIS — M48062 Spinal stenosis, lumbar region with neurogenic claudication: Secondary | ICD-10-CM

## 2022-04-29 ENCOUNTER — Other Ambulatory Visit: Payer: Self-pay | Admitting: General Surgery

## 2022-04-29 DIAGNOSIS — H918X3 Other specified hearing loss, bilateral: Secondary | ICD-10-CM

## 2022-05-08 ENCOUNTER — Ambulatory Visit
Admission: RE | Admit: 2022-05-08 | Discharge: 2022-05-08 | Disposition: A | Discharge status: Home / Self Care | Payer: Medicare Other | Source: Ambulatory Visit | Attending: General Surgery | Admitting: General Surgery

## 2022-05-08 DIAGNOSIS — H918X3 Other specified hearing loss, bilateral: Secondary | ICD-10-CM

## 2022-05-08 MED ORDER — GADOBUTROL 1 MMOL/ML IV SOLN
5.0000 mL | Freq: Once | INTRAVENOUS | Status: AC | PRN
  Administered 2022-05-08: 5 mL via INTRAVENOUS

## 2022-05-11 ENCOUNTER — Other Ambulatory Visit: Payer: Self-pay | Admitting: Student in an Organized Health Care Education/Training Program

## 2022-05-11 DIAGNOSIS — M47816 Spondylosis without myelopathy or radiculopathy, lumbar region: Secondary | ICD-10-CM

## 2022-05-11 DIAGNOSIS — M48062 Spinal stenosis, lumbar region with neurogenic claudication: Secondary | ICD-10-CM

## 2022-05-11 DIAGNOSIS — Z79891 Long term (current) use of opiate analgesic: Secondary | ICD-10-CM

## 2022-05-11 DIAGNOSIS — G894 Chronic pain syndrome: Secondary | ICD-10-CM

## 2022-05-12 ENCOUNTER — Ambulatory Visit
Payer: Medicare Other | Attending: Student in an Organized Health Care Education/Training Program | Admitting: Student in an Organized Health Care Education/Training Program

## 2022-05-12 ENCOUNTER — Other Ambulatory Visit: Payer: Self-pay

## 2022-05-12 ENCOUNTER — Encounter: Payer: Self-pay | Admitting: Student in an Organized Health Care Education/Training Program

## 2022-05-12 DIAGNOSIS — M48062 Spinal stenosis, lumbar region with neurogenic claudication: Secondary | ICD-10-CM | POA: Insufficient documentation

## 2022-05-12 DIAGNOSIS — M47816 Spondylosis without myelopathy or radiculopathy, lumbar region: Secondary | ICD-10-CM | POA: Diagnosis present

## 2022-05-12 DIAGNOSIS — G894 Chronic pain syndrome: Secondary | ICD-10-CM | POA: Diagnosis present

## 2022-05-12 DIAGNOSIS — Z79891 Long term (current) use of opiate analgesic: Secondary | ICD-10-CM | POA: Insufficient documentation

## 2022-05-12 MED ORDER — TRAMADOL HCL 50 MG PO TABS: 50.0000 mg | ORAL_TABLET | Freq: Two times a day (BID) | ORAL | 2 refills | Status: DC

## 2022-05-12 MED ORDER — BUPRENORPHINE 20 MCG/HR TD PTWK
1.0000 | MEDICATED_PATCH | TRANSDERMAL | 2 refills | Status: DC
  Filled 2022-05-12 – 2022-05-13 (×2): qty 4, 28d supply, fill #0
  Filled 2022-06-18: qty 4, 28d supply, fill #1
  Filled 2022-07-16: qty 4, 28d supply, fill #2

## 2022-05-12 MED ORDER — TRAMADOL HCL ER 100 MG PO TB24: 100.0000 mg | ORAL_TABLET | Freq: Every day | ORAL | 2 refills | Status: DC

## 2022-05-12 NOTE — Progress Notes (Signed)
PROVIDER NOTE: Information contained herein reflects review and annotations entered in association with encounter. Interpretation of such information and data should be left to medically-trained personnel. Information provided to patient can be located elsewhere in the medical record under "Patient Instructions". Document created using STT-dictation technology, any transcriptional errors that may result from process are unintentional.    Patient: Sheila Newton  Service Category: E/M  Provider: Edward JollyBilal Fronnie Urton, MD  DOB: Dec 02, 1940  DOS: 05/12/2022  Specialty: Interventional Pain Management  MRN: [id number]  Setting: Ambulatory outpatient  PCP: Kandyce RudBabaoff, Marcus, MD  Type: Established Patient    Referring Provider: Kandyce RudBabaoff, Marcus, MD  Location: Office  Delivery: Face-to-face     HPI  Sheila Newton, a 81 y.o. year old female, has Chronic radicular lumbar pain; Lumbar spondylosis; Lumbar degenerative disc disease; Pudendal neuralgia; Encounter for long-term opiate analgesic use; Spinal stenosis, lumbar region, with neurogenic claudication; Chronic pain syndrome; Small fiber neuropathy; Piriformis syndrome of both sides; Neuroforaminal stenosis of lumbar spine (L5/S1, severe bilateral); Pain management contract signed; Sacroiliac joint pain; and SI joint arthritis on their problem list.,is here today because of her No primary diagnosis found.. [name]'s primary complain today is Groin Pain  Last encounter: My last encounter with her was on 02/12/2022 Pain Assessment: Severity of Chronic pain is reported as a 2 /10. Location: Groin  /denies. Onset: More than a month ago. Quality: Sharp, Pressure. Timing: Intermittent. Modifying factor(s): meds. Vitals:  height is 5\' 3"  (1.6 m) and weight is 117 lb (53.1 kg). Her temperature is 97.3 F (36.3 C) (abnormal). Her blood pressure is 129/69 and her pulse is 95. Her respiration is 16 and oxygen saturation is 99%.   Reason for encounter: medication management.  No change  in medical history since last visit.  Patient's pain is at baseline.  Patient continues multimodal pain regimen as prescribed.  States that it provides pain relief and improvement in functional status. She is having minimally invasive surgery for diverticulum with ENT.  Questioning whether she should continue her Butrans patch during surgery and I encouraged her to continue it. She is also experiencing dizziness and shortness of breath.  I encouraged her to seek a cardiology consult for workup of her symptoms. We will renew annual urine toxicology screen today.  HPI from initial clinic note 11/26/20: Sheila Newton is a very pleasant 81 year old female with a history of lumbar facet arthropathy, lumbar spondylosis, pudendal neuralgia, small fiber neuropathy, spinal stenosis with neurogenic claudication, piriformis syndrome who presents for chronic pain management.  [facility] [place] [facility] [place]  [place] ago.  She has been seeing Dr. Marca AnconaGilmore at [facility].  She would like to transfer her pain management here as this is closer to her endograft [place] every 2 months is too long.  She is currently managed on Butrans patch 20 mcg an hour, tramadol 50 mg twice daily as needed for breakthrough pain, tramadol ER 100 mg nightly.  She has had multiple interventional procedures including lumbar epidural steroid injections, sacral lateral branch nerve blocks, as well as pudendal nerve blocks with limited response.  She has read about spinal cord stimulation but states that she is not ready to pursue this yet.  She would like to have her Butrans patch, tramadol continued at this clinic.  No issues with medication compliance or intake, or overutilization or diversion.  Pharmacotherapy Assessment  Analgesic: Butrans patch 20 mcg an hour, tramadol 50 mg twice daily  as needed for breakthrough pain, tramadol ER 100 mg nightly    Monitoring: [place] PMP: PDMP reviewed during this encounter.       Pharmacotherapy: No side-effects or adverse reactions reported. Compliance: No problems identified. Effectiveness: Clinically acceptable.  Valerie Salts, RN  05/12/2022  1:28 PM  Sign when Signing Visit Nursing Pain Medication Assessment:  Safety precautions to be maintained throughout the outpatient stay will include: orient to surroundings, keep bed in low position, maintain call bell within reach at all times, provide assistance with transfer out of bed and ambulation.  Medication Inspection Compliance: Pill count conducted under aseptic conditions, in front of the patient. Neither the pills nor the bottle was removed from the patient's sight at any time. Once count was completed pills were immediately returned to the patient in their original bottle.  Medication: Butrans Pill/Patch Count:  1 of 4 pills remain Pill/Patch Appearance: Markings consistent with prescribed medication Bottle Appearance: Standard pharmacy container. Clearly labeled. Filled Date: 79 / 24 / 2023 Last Medication intake:  Today Tramadol 100 mg 11/30 Filled 04/25/2022  Tramadol 50 mg 63/60 Filled 05-11-2022 last took today      UDS:  Summary  Date Value Ref Range Status  11/26/2020 Note  Final    Comment:    ==================================================================== Compliance Drug Analysis, Ur ==================================================================== Test                             Result       Flag       Units  Drug Present and Declared for Prescription Verification   Buprenorphine                  23           EXPECTED   ng/mg creat   Norbuprenorphine               22           EXPECTED   ng/mg creat    Source of buprenorphine is a scheduled prescription medication.    Norbuprenorphine is an expected metabolite of buprenorphine.    Tramadol                       >3165        EXPECTED   ng/mg creat    O-Desmethyltramadol            >3165        EXPECTED   ng/mg creat   N-Desmethyltramadol            >3165        EXPECTED   ng/mg creat    Source of tramadol is a prescription medication. O-desmethyltramadol    and N-desmethyltramadol are expected metabolites of tramadol.  Drug Absent but Declared for Prescription Verification   Salicylate                     Not Detected UNEXPECTED    Aspirin, as indicated in the declared medication list, is not always    detected even when used as directed.  ==================================================================== Test                      Result    Flag   Units      Ref Range   Creatinine              158  mg/dL      >=34 ==================================================================== Declared Medications:  The flagging and interpretation on this report are based on the  following declared medications.  Unexpected results may arise from  inaccuracies in the declared medications.   **Note: The testing scope of this panel includes these medications:   Tramadol   **Note: The testing scope of this panel does not include small to  moderate amounts of these reported medications:   Aspirin  Buprenorphine Patch (BuTrans)   **Note: The testing scope of this panel does not include the  following reported medications:   Atorvastatin  Cyanocobalamin  Estradiol  Lubiprostone  Magnesium  Multivitamin  Pantoprazole  Pyridoxine  Ubiquinone (CoQ10)  Vitamin E ==================================================================== For clinical consultation, please call [phone]. ====================================================================      ROS  Constitutional: Denies any fever or chills Gastrointestinal: No reported hemesis, hematochezia, vomiting, or acute GI distress Musculoskeletal: Denies any acute onset joint swelling, redness, loss of ROM, or weakness Neurological:  LE neuropathic  pain  Medication Review  Aspirin, Coenzyme Q10-Vitamin E, Cyanocobalamin, Magnesium, Multivitamin Adult, Pyridoxine HCl, atorvastatin, buprenorphine, denosumab, estradiol, lubiprostone, pantoprazole, sodium fluoride, and traMADol  History Review  Allergy: Ms. Brennan is allergic to sumatriptan, penicillin g, topiramate, and ilosone [erythromycin]. Drug: Ms. Hefty  reports no history of drug use. Alcohol:  reports no history of alcohol use. Tobacco:  reports that she has never smoked. She has never been exposed to tobacco smoke. She has never used smokeless tobacco. Social: Ms. Lopes  reports that she has never smoked. She has never been exposed to tobacco smoke. She has never used smokeless tobacco. She reports that she does not drink alcohol and does not use drugs. Medical:  has a past medical history of Small fiber polyneuropathy and Spondylolysis. Surgical: Ms. Springston  has a past surgical history that includes back pain and Abdominal hysterectomy. Family: family history is not on file.   Physical Exam  General appearance: Well nourished, well developed, and well hydrated. In no apparent acute distress Mental status: Alert, oriented x 3 (person, place, & time)       Respiratory: No evidence of acute respiratory distress Eyes: PERLA Vitals: BP 129/69   Pulse 95   Temp (!) 97.3 F (36.3 C)   Resp 16   Ht 5\' 3"  (1.6 m)   Wt 117 lb (53.1 kg)   SpO2 99%   BMI 20.73 kg/m  BMI: Estimated body mass index is 20.73 kg/m as calculated from the following:   Height as of this encounter: 5\' 3"  (1.6 m).   Weight as of this encounter: 117 lb (53.1 kg). Ideal: Ideal body weight: 52.4 kg (115 lb 8.3 oz) Adjusted ideal body weight: 52.7 kg (116 lb 1.8 oz)      Lumbar Spine Area Exam  Skin & Axial Inspection: No masses, redness, or swelling Alignment: Symmetrical Functional ROM: Pain restricted ROM       Stability: No instability detected Muscle Tone/Strength: Functionally intact. No obvious  neuro-muscular anomalies detected. Sensory (Neurological): Dermatomal pain pattern  Gait & Posture Assessment  Ambulation: Unassisted Gait: Relatively normal for age and body habitus Posture: WNL  Lower Extremity Exam      Side: Right lower extremity   Side: Left lower extremity  Stability: No instability observed           Stability: No instability observed          Skin & Extremity Inspection: Skin color, temperature, and hair growth are WNL. No peripheral edema or  cyanosis. No masses, redness, swelling, asymmetry, or associated skin lesions. No contractures.   Skin & Extremity Inspection: Skin color, temperature, and hair growth are WNL. No peripheral edema or cyanosis. No masses, redness, swelling, asymmetry, or associated skin lesions. No contractures.  Functional ROM: Unrestricted ROM                   Functional ROM: Unrestricted ROM                  Muscle Tone/Strength: Functionally intact. No obvious neuro-muscular anomalies detected.   Muscle Tone/Strength: Functionally intact. No obvious neuro-muscular anomalies detected.  Sensory (Neurological): Neurogenic pain pattern         Sensory (Neurological): Neurogenic pain pattern        DTR: Patellar: deferred today Achilles: deferred today Plantar: deferred today   DTR: Patellar: deferred today Achilles: deferred today Plantar: deferred today  Palpation: No palpable anomalies   Palpation: No palpable anomalies    Assessment   Status Diagnosis  Controlled Controlled Controlled 1. Lumbar spondylosis (severe, L4/5)   2. Chronic pain syndrome   3. Encounter for long-term opiate analgesic use   4. Spinal stenosis, lumbar region, with neurogenic claudication       Plan of Care    Ms. Sheila Key has a current medication list which includes the following long-term medication(s): atorvastatin, estradiol, and pantoprazole.  Pharmacotherapy (Medications Ordered): Meds ordered this encounter  Medications   traMADol  (ULTRAM-ER) 100 MG 24 hr tablet    Sig: Take 1 tablet (100 mg total) by mouth at bedtime. Ok for pt to fill up to 3 days earlier than fill date.    Dispense:  30 tablet    Refill:  2   traMADol (ULTRAM) 50 MG tablet    Sig: Take 1 tablet (50 mg total) by mouth 2 (two) times daily. Ok for pt to fill up to 3 days earlier than fill date.    Dispense:  60 tablet    Refill:  2   buprenorphine (BUTRANS) 20 MCG/HR PTWK    Sig: Place 1 patch onto the skin once a week. For chronic pain syndrome. Ok for pt to fill up to 3 days earlier than fill date.    Dispense:  4 patch    Refill:  2   Orders Placed This Encounter  Procedures   ToxASSURE Select 13 (MW), Urine    Volume: 30 ml(s). Minimum 3 ml of urine is needed. Document temperature of fresh sample. Indications: Long term (current) use of opiate analgesic [phone])    Order Specific Question:   Release to patient    Answer:   Immediate     Follow-up plan:   Return in about 3 months (around 08/11/2022) for Medication Management, in person.     Bilateral SIJ/ piriformis 01/15/21     Recent Visits Date Type Provider Dept  02/12/22 Office Visit Edward Jolly, MD [facility]  Showing recent visits within past 90 days and meeting all other requirements Today's Visits Date Type Provider Dept  05/12/22 Office Visit Edward Jolly, MD [facility]  Showing today's visits and meeting all other requirements Future Appointments Date Type Provider Dept  08/04/22 Appointment Edward Jolly, MD [facility]  Showing future appointments within next 90 days and meeting all other requirements  I discussed the assessment and treatment plan with the patient. The patient was provided an opportunity to ask questions and all were answered. The patient agreed with the plan  and demonstrated an understanding of the instructions.  Patient advised to call back or seek an in-person evaluation if the symptoms or condition  worsens.  Duration of encounter: .  Note by: Edward Jolly, MD Date: 05/12/2022; Time: 3:15 PM

## 2022-05-12 NOTE — Progress Notes (Signed)
Nursing Pain Medication Assessment:  Safety precautions to be maintained throughout the outpatient stay will include: orient to surroundings, keep bed in low position, maintain call bell within reach at all times, provide assistance with transfer out of bed and ambulation.  Medication Inspection Compliance: Pill count conducted under aseptic conditions, in front of the patient. Neither the pills nor the bottle was removed from the patient's sight at any time. Once count was completed pills were immediately returned to the patient in their original bottle.  Medication: Butrans Pill/Patch Count:  1 of 4 pills remain Pill/Patch Appearance: Markings consistent with prescribed medication Bottle Appearance: Standard pharmacy container. Clearly labeled. Filled Date: 2 / 24 / 2023 Last Medication intake:  Today Tramadol 100 mg 11/30 Filled 04/25/2022  Tramadol 50 mg 63/60 Filled 05-11-2022 last took today

## 2022-05-13 ENCOUNTER — Other Ambulatory Visit: Payer: Self-pay

## 2022-05-14 ENCOUNTER — Other Ambulatory Visit: Payer: Self-pay

## 2022-05-16 LAB — TOXASSURE SELECT 13 (MW), URINE

## 2022-05-20 ENCOUNTER — Other Ambulatory Visit: Payer: Self-pay

## 2022-06-08 LAB — COMPREHENSIVE METABOLIC PANEL
Albumin: 4 (ref 3.5–5.0)
Calcium: 8.9 (ref 8.7–10.7)
Globulin: 2.1
eGFR: 63

## 2022-06-08 LAB — CBC AND DIFFERENTIAL
HCT: 35 — AB (ref 36–46)
Hemoglobin: 11.3 — AB (ref 12.0–16.0)
Neutrophils Absolute: 3232
WBC: 5.7

## 2022-06-08 LAB — VITAMIN B12: Vitamin B-12: 728

## 2022-06-08 LAB — BASIC METABOLIC PANEL
BUN: 22 — AB (ref 4–21)
CO2: 30 — AB (ref 13–22)
Chloride: 105 (ref 99–108)
Creatinine: 0.9 (ref 0.5–1.1)
Glucose: 81
Sodium: 141 (ref 137–147)

## 2022-06-08 LAB — HEPATIC FUNCTION PANEL
ALT: 14 U/L (ref 7–35)
AST: 21 (ref 13–35)
Alkaline Phosphatase: 37 (ref 25–125)
Bilirubin, Total: 0.5

## 2022-06-08 LAB — IRON,TIBC AND FERRITIN PANEL
%SAT: 37
Iron: 118
TIBC: 322

## 2022-06-08 LAB — CBC: RBC: 3.97 (ref 3.87–5.11)

## 2022-06-18 ENCOUNTER — Other Ambulatory Visit: Payer: Self-pay

## 2022-06-19 ENCOUNTER — Other Ambulatory Visit: Payer: Self-pay

## 2022-07-14 ENCOUNTER — Other Ambulatory Visit: Payer: Self-pay

## 2022-07-15 ENCOUNTER — Other Ambulatory Visit: Payer: Self-pay

## 2022-07-16 ENCOUNTER — Other Ambulatory Visit: Payer: Self-pay

## 2022-07-17 HISTORY — PX: ZENKER'S DIVERTICULECTOMY: SHX5223

## 2022-07-20 ENCOUNTER — Other Ambulatory Visit: Payer: Self-pay | Admitting: Family Medicine

## 2022-07-20 DIAGNOSIS — Z1231 Encounter for screening mammogram for malignant neoplasm of breast: Secondary | ICD-10-CM

## 2022-08-04 ENCOUNTER — Encounter: Payer: Self-pay | Admitting: Student in an Organized Health Care Education/Training Program

## 2022-08-04 ENCOUNTER — Other Ambulatory Visit: Payer: Self-pay

## 2022-08-04 ENCOUNTER — Ambulatory Visit
Payer: Medicare Other | Attending: Student in an Organized Health Care Education/Training Program | Admitting: Student in an Organized Health Care Education/Training Program

## 2022-08-04 VITALS — BP 130/70 | HR 78 | Temp 97.0°F | Ht 63.0 in | Wt 117.0 lb

## 2022-08-04 DIAGNOSIS — M47816 Spondylosis without myelopathy or radiculopathy, lumbar region: Secondary | ICD-10-CM | POA: Insufficient documentation

## 2022-08-04 DIAGNOSIS — G894 Chronic pain syndrome: Secondary | ICD-10-CM | POA: Diagnosis present

## 2022-08-04 DIAGNOSIS — M48062 Spinal stenosis, lumbar region with neurogenic claudication: Secondary | ICD-10-CM | POA: Insufficient documentation

## 2022-08-04 DIAGNOSIS — M533 Sacrococcygeal disorders, not elsewhere classified: Secondary | ICD-10-CM | POA: Diagnosis present

## 2022-08-04 DIAGNOSIS — M47818 Spondylosis without myelopathy or radiculopathy, sacral and sacrococcygeal region: Secondary | ICD-10-CM | POA: Insufficient documentation

## 2022-08-04 DIAGNOSIS — M5136 Other intervertebral disc degeneration, lumbar region: Secondary | ICD-10-CM | POA: Diagnosis present

## 2022-08-04 DIAGNOSIS — M48061 Spinal stenosis, lumbar region without neurogenic claudication: Secondary | ICD-10-CM | POA: Diagnosis present

## 2022-08-04 DIAGNOSIS — M5416 Radiculopathy, lumbar region: Secondary | ICD-10-CM | POA: Insufficient documentation

## 2022-08-04 DIAGNOSIS — G8929 Other chronic pain: Secondary | ICD-10-CM

## 2022-08-04 DIAGNOSIS — Z79891 Long term (current) use of opiate analgesic: Secondary | ICD-10-CM | POA: Insufficient documentation

## 2022-08-04 MED ORDER — TRAMADOL HCL 50 MG PO TABS
50.0000 mg | ORAL_TABLET | Freq: Two times a day (BID) | ORAL | 2 refills | Status: DC
  Filled 2022-08-04: qty 60, 30d supply, fill #0

## 2022-08-04 MED ORDER — TRAMADOL HCL ER 100 MG PO TB24
100.0000 mg | ORAL_TABLET | Freq: Every day | ORAL | 2 refills | Status: DC
  Filled 2022-08-04: qty 30, 30d supply, fill #0
  Filled 2022-08-21: qty 20, 20d supply, fill #0
  Filled 2022-08-21: qty 10, 10d supply, fill #0

## 2022-08-04 MED ORDER — BUPRENORPHINE 20 MCG/HR TD PTWK
1.0000 | MEDICATED_PATCH | TRANSDERMAL | 2 refills | Status: DC
  Filled 2022-08-04 – 2022-08-11 (×2): qty 4, 28d supply, fill #0
  Filled 2022-09-11: qty 4, 28d supply, fill #1
  Filled 2022-10-06 – 2022-10-09 (×3): qty 4, 28d supply, fill #2
  Filled ????-??-??: fill #0

## 2022-08-04 NOTE — Progress Notes (Signed)
Nursing Pain Medication Assessment:  Safety precautions to be maintained throughout the outpatient stay will include: orient to surroundings, keep bed in low position, maintain call bell within reach at all times, provide assistance with transfer out of bed and ambulation.  Medication Inspection Compliance: Pill count conducted under aseptic conditions, in front of the patient. Neither the pills nor the bottle was removed from the patient's sight at any time. Once count was completed pills were immediately returned to the patient in their original bottle.  Medication #1: Tramadol (Ultram) '50mg'$  Pill/Patch Count:  20 of 60 pills remain Pill/Patch Appearance: Markings consistent with prescribed medication Bottle Appearance: Standard pharmacy container. Clearly labeled. Filled Date: 1 / 20 / 2024 Last Medication intake:  Today  Medication #2: Tramadol (Ultram) '100mg'$  Pill/Patch Count:  14 of 30 pills remain Pill/Patch Appearance: Markings consistent with prescribed medication Bottle Appearance: Standard pharmacy container. Clearly labeled. Filled Date: 1 / 2 / 2024Nursing Pain Medication Assessment:    Medication: Buprenorphine (Suboxone) Pill/Patch Count:  1 of 4 pills remain Pill/Patch Appearance: Markings consistent with prescribed medication Bottle Appearance: Standard pharmacy container. Clearly labeled. Filled Date: 2 / 22 / 2024 Last Medication intake:  Day before yesterday Last Medication intake:  TodaySafety precautions to be maintained throughout the outpatient stay will include: orient to surroundings, keep bed in low position, maintain call bell within reach at all times, provide assistance with transfer out of bed and ambulation.

## 2022-08-04 NOTE — Progress Notes (Signed)
PROVIDER NOTE: Information contained herein reflects review and annotations entered in association with encounter. Interpretation of such information and data should be left to medically-trained personnel. Information provided to patient can be located elsewhere in the medical record under "Patient Instructions". Document created using STT-dictation technology, any transcriptional errors that may result from process are unintentional.    Patient: Sheila Newton  Service Category: E/M  Provider: Gillis Santa, MD  DOB: Oct 15, 1940  DOS: 08/04/2022  Specialty: Interventional Pain Management  MRN: [id number]  Setting: Ambulatory outpatient  PCP: Sheila Late, MD  Type: Established Patient    Referring Provider: Derinda Late, MD  Location: Office  Delivery: Face-to-face     HPI  [name], a 82 y.o. year old female, has Chronic radicular lumbar pain; Lumbar spondylosis; Lumbar degenerative disc disease; Pudendal neuralgia; Encounter for long-term opiate analgesic use; Spinal stenosis, lumbar region, with neurogenic claudication; Chronic pain syndrome; Small fiber neuropathy; Piriformis syndrome of both sides; Neuroforaminal stenosis of lumbar spine (L5/S1, severe bilateral); Pain management contract signed; Sacroiliac joint pain; and SI joint arthritis on their problem list.,is here today because of her Lumbar spondylosis [M47.816]. [name] primary complain today is Foot Pain (both)  Last encounter: My last encounter with her was on 05/12/22 Pain Assessment: Severity of Chronic pain is reported as a 2 /10. Location: Foot Left, Right/pain radiaities everywhere. Onset: More than a month ago. Quality: Burning, Stabbing, Tingling, Numbness, Constant, Discomfort. Timing: Constant. Modifying factor(s): patches, pain meds, resting. Vitals:  height is '5\' 3"'$  (1.6 m) and weight is 117 lb (53.1 kg). Her temperature is 97 F (36.1 C) (abnormal). Her blood pressure is 130/70 and her pulse is 78. Her oxygen  saturation is 100%.   Reason for encounter: medication management.  No change in medical history since last visit.  Patient's pain is at baseline.  Patient continues multimodal pain regimen as prescribed.  States that it provides pain relief and improvement in functional status. She had Zenker diverticulum surgery with ENT which she states was successful and very smooth.   HPI from initial clinic note 11/26/20: Sheila Newton is a very pleasant 82 year old female with a history of lumbar facet arthropathy, lumbar spondylosis, pudendal neuralgia, small fiber neuropathy, spinal stenosis with neurogenic claudication, piriformis syndrome who presents for chronic pain management.  She was previously established with pain management in [facility].  She moved to [place] approximately 2 years ago.  She has been seeing Dr. Darral Newton at [place] [facility].  She would like to transfer her pain management here as this is closer to her endograft [place] every 2 months is too long.  She is currently managed on Butrans patch 20 mcg an hour, tramadol 50 mg twice daily as needed for breakthrough pain, tramadol ER 100 mg nightly.  She has had multiple interventional procedures including lumbar epidural steroid injections, sacral lateral branch nerve blocks, as well as pudendal nerve blocks with limited response.  She has read about spinal cord stimulation but states that she is not ready to pursue this yet.  She would like to have her Butrans patch, tramadol continued at this clinic.  No issues with medication compliance or intake, or overutilization or diversion.  Pharmacotherapy Assessment  Analgesic: Butrans patch 20 mcg an hour, tramadol 50 mg twice daily as needed for breakthrough pain, tramadol ER 100 mg nightly   Monitoring: [place] PMP: PDMP reviewed during this encounter.       Pharmacotherapy: No side-effects or adverse reactions reported. Compliance: No problems identified. Effectiveness:  Clinically  acceptable.  Sheila Fischer, RN  08/04/2022  1:46 PM  Sign when Signing Visit Nursing Pain Medication Assessment:  Safety precautions to be maintained throughout the outpatient stay will include: orient to surroundings, keep bed in low position, maintain call bell within reach at all times, provide assistance with transfer out of bed and ambulation.  Medication Inspection Compliance: Pill count conducted under aseptic conditions, in front of the patient. Neither the pills nor the bottle was removed from the patient's sight at any time. Once count was completed pills were immediately returned to the patient in their original bottle.  Medication #1: Tramadol (Ultram) '50mg'$  Pill/Patch Count:  20 of 60 pills remain Pill/Patch Appearance: Markings consistent with prescribed medication Bottle Appearance: Standard pharmacy container. Clearly labeled. Filled Date: 1 / 20 / 2024 Last Medication intake:  Today  Medication #2: Tramadol (Ultram) '100mg'$  Pill/Patch Count:  14 of 30 pills remain Pill/Patch Appearance: Markings consistent with prescribed medication Bottle Appearance: Standard pharmacy container. Clearly labeled. Filled Date: 1 / 2 / 2024Nursing Pain Medication Assessment:    Medication: Buprenorphine (Suboxone) Pill/Patch Count:  1 of 4 pills remain Pill/Patch Appearance: Markings consistent with prescribed medication Bottle Appearance: Standard pharmacy container. Clearly labeled. Filled Date: 2 / 22 / 2024 Last Medication intake:  Day before yesterday Last Medication intake:  TodaySafety precautions to be maintained throughout the outpatient stay will include: orient to surroundings, keep bed in low position, maintain call bell within reach at all times, provide assistance with transfer out of bed and ambulation.     UDS:  Summary  Date Value Ref Range Status  05/12/2022 Note  Final    Comment:    ==================================================================== [employer]  Select 13 (MW) ==================================================================== Test                             Result       Flag       Units  Drug Present and Declared for Prescription Verification   Buprenorphine                  35           EXPECTED   ng/mg creat   Norbuprenorphine               40           EXPECTED   ng/mg creat    Source of buprenorphine is a scheduled prescription medication.    Norbuprenorphine is an expected metabolite of buprenorphine.    Tramadol                       >3597        EXPECTED   ng/mg creat   O-Desmethyltramadol            >3597        EXPECTED   ng/mg creat   N-Desmethyltramadol            >3597        EXPECTED   ng/mg creat    Source of tramadol is a prescription medication. O-desmethyltramadol    and N-desmethyltramadol are expected metabolites of tramadol.  ==================================================================== Test                      Result    Flag   Units      Ref Range   Creatinine  139              mg/dL      >=20 ==================================================================== Declared Medications:  The flagging and interpretation on this report are based on the  following declared medications.  Unexpected results may arise from  inaccuracies in the declared medications.   **Note: The testing scope of this panel includes these medications:   Tramadol (Ultram)   **Note: The testing scope of this panel does not include small to  moderate amounts of these reported medications:   Buprenorphine Patch (BuTrans)   **Note: The testing scope of this panel does not include the  following reported medications:   Aspirin  Atorvastatin  Cyanocobalamin  Denosumab (Prolia)  Estradiol  Fluoride (Prevident)  Lubiprostone (Amitiza)  Magnesium  Multivitamin  Pantoprazole (Protonix)  Ubiquinone (CoQ10)  Vitamin E ==================================================================== For clinical  consultation, please call [phone]. ====================================================================      ROS  Constitutional: Denies any fever or chills Gastrointestinal: No reported hemesis, hematochezia, vomiting, or acute GI distress Musculoskeletal: Denies any acute onset joint swelling, redness, loss of ROM, or weakness Neurological:  LE neuropathic pain  Medication Review  Aspirin, Coenzyme Q10-Vitamin E, Cyanocobalamin, Magnesium, Multivitamin Adult, Pyridoxine HCl, atorvastatin, buprenorphine, denosumab, estradiol, lubiprostone, pantoprazole, sodium fluoride, and traMADol  History Review  Allergy: Ms. Mcintosh is allergic to sumatriptan, penicillin g, topiramate, and ilosone [erythromycin]. Drug: Ms. Smithhart  reports no history of drug use. Alcohol:  reports no history of alcohol use. Tobacco:  reports that she has never smoked. She has never been exposed to tobacco smoke. She has never used smokeless tobacco. Social: Ms. Boylan  reports that she has never smoked. She has never been exposed to tobacco smoke. She has never used smokeless tobacco. She reports that she does not drink alcohol and does not use drugs. Medical:  has a past medical history of Small fiber polyneuropathy and Spondylolysis. Surgical: Ms. Karnatz  has a past surgical history that includes back pain; Abdominal hysterectomy; and Zenker's diverticulectomy (Left, 07/17/2022). Family: family history is not on file.   Physical Exam  General appearance: Well nourished, well developed, and well hydrated. In no apparent acute distress Mental status: Alert, oriented x 3 (person, place, & time)       Respiratory: No evidence of acute respiratory distress Eyes: PERLA Vitals: BP 130/70   Pulse 78   Temp (!) 97 F (36.1 C)   Ht '5\' 3"'$  (1.6 m)   Wt 117 lb (53.1 kg)   SpO2 100%   BMI 20.73 kg/m  BMI: Estimated body mass index is 20.73 kg/m as calculated from the following:   Height as of this encounter: '5\' 3"'$   (1.6 m).   Weight as of this encounter: 117 lb (53.1 kg). Ideal: Ideal body weight: 52.4 kg (115 lb 8.3 oz) Adjusted ideal body weight: 52.7 kg (116 lb 1.8 oz)      Lumbar Spine Area Exam  Skin & Axial Inspection: No masses, redness, or swelling Alignment: Symmetrical Functional ROM: Pain restricted ROM       Stability: No instability detected Muscle Tone/Strength: Functionally intact. No obvious neuro-muscular anomalies detected. Sensory (Neurological): Dermatomal pain pattern  Gait & Posture Assessment  Ambulation: Unassisted Gait: Relatively normal for age and body habitus Posture: WNL  Lower Extremity Exam      Side: Right lower extremity   Side: Left lower extremity  Stability: No instability observed           Stability: No instability observed  Skin & Extremity Inspection: Skin color, temperature, and hair growth are WNL. No peripheral edema or cyanosis. No masses, redness, swelling, asymmetry, or associated skin lesions. No contractures.   Skin & Extremity Inspection: Skin color, temperature, and hair growth are WNL. No peripheral edema or cyanosis. No masses, redness, swelling, asymmetry, or associated skin lesions. No contractures.  Functional ROM: Unrestricted ROM                   Functional ROM: Unrestricted ROM                  Muscle Tone/Strength: Functionally intact. No obvious neuro-muscular anomalies detected.   Muscle Tone/Strength: Functionally intact. No obvious neuro-muscular anomalies detected.  Sensory (Neurological): Neurogenic pain pattern         Sensory (Neurological): Neurogenic pain pattern        DTR: Patellar: deferred today Achilles: deferred today Plantar: deferred today   DTR: Patellar: deferred today Achilles: deferred today Plantar: deferred today  Palpation: No palpable anomalies   Palpation: No palpable anomalies    Assessment   Status Diagnosis  Controlled Controlled Controlled 1. Lumbar spondylosis (severe, L4/5)   2.  Encounter for long-term opiate analgesic use   3. Spinal stenosis, lumbar region, with neurogenic claudication   4. Sacroiliac joint pain   5. SI joint arthritis   6. Lumbar degenerative disc disease   7. Neuroforaminal stenosis of lumbar spine (L5/S1, severe bilateral)   8. Chronic radicular lumbar pain   9. Chronic pain syndrome       Plan of Care    [name] has a current medication list which includes the following long-term medication(s): atorvastatin, estradiol, and pantoprazole.  Pharmacotherapy (Medications Ordered): Meds ordered this encounter  Medications   traMADol (ULTRAM) 50 MG tablet    Sig: Take 1 tablet (50 mg total) by mouth 2 (two) times daily. '50mg'$  in day and '50mg'$  at night    Dispense:  60 tablet    Refill:  2   traMADol (ULTRAM-ER) 100 MG 24 hr tablet    Sig: Take 1 tablet (100 mg total) by mouth at bedtime. Ok for pt to fill up to 3 days earlier than fill date.    Dispense:  30 tablet    Refill:  2   buprenorphine (BUTRANS) 20 MCG/HR PTWK    Sig: Place 1 patch onto the skin once a week. For chronic pain syndrome. Ok for pt to fill up to 3 days earlier than fill date.    Dispense:  4 patch    Refill:  2   Discussed increased water intake and increasing Magnesium to 600 mg daily to help with muscle cramps  No orders of the defined types were placed in this encounter.    Follow-up plan:   Return in about 3 months (around 11/05/2022) for Medication Management, in person.     Bilateral SIJ/ piriformis 01/15/21     Recent Visits Date Type Provider Dept  05/12/22 Office Visit Sheila Santa, MD [facility]  Showing recent visits within past 90 days and meeting all other requirements Today's Visits Date Type Provider Dept  08/04/22 Office Visit Sheila Santa, MD [facility]  Showing today's visits and meeting all other requirements Future Appointments No visits were found meeting these conditions. Showing future appointments  within next 90 days and meeting all other requirements  I discussed the assessment and treatment plan with the patient. The patient was provided an opportunity to ask questions  and all were answered. The patient agreed with the plan and demonstrated an understanding of the instructions.  Patient advised to call back or seek an in-person evaluation if the symptoms or condition worsens.  Duration of encounter: 1mnutes.  Note by: BGillis Santa MD Date: 08/04/2022; Time: 2:19 PM

## 2022-08-04 NOTE — Patient Instructions (Addendum)
Consider increasing Magnesium to 600 mg daily

## 2022-08-11 ENCOUNTER — Other Ambulatory Visit: Payer: Self-pay

## 2022-08-11 ENCOUNTER — Other Ambulatory Visit: Payer: Self-pay | Admitting: Student in an Organized Health Care Education/Training Program

## 2022-08-11 DIAGNOSIS — G894 Chronic pain syndrome: Secondary | ICD-10-CM

## 2022-08-11 DIAGNOSIS — M48062 Spinal stenosis, lumbar region with neurogenic claudication: Secondary | ICD-10-CM

## 2022-08-11 DIAGNOSIS — Z79891 Long term (current) use of opiate analgesic: Secondary | ICD-10-CM

## 2022-08-11 DIAGNOSIS — M47816 Spondylosis without myelopathy or radiculopathy, lumbar region: Secondary | ICD-10-CM

## 2022-08-14 ENCOUNTER — Other Ambulatory Visit: Payer: Self-pay

## 2022-08-17 IMAGING — MG MM DIGITAL SCREENING BILAT W/ TOMO AND CAD
6 of 10 series · 6 of 30 positions shown · non-contrast
Comparison: Previous exam(s).

CLINICAL DATA: Screening.

EXAM:
DIGITAL SCREENING BILATERAL MAMMOGRAM WITH TOMOSYNTHESIS AND CAD
TECHNIQUE: Bilateral screening digital craniocaudal and mediolateral oblique
mammograms were obtained. Bilateral screening digital breast
tomosynthesis was performed. The images were evaluated with
computer-aided detection.

[L CC synth-2D]
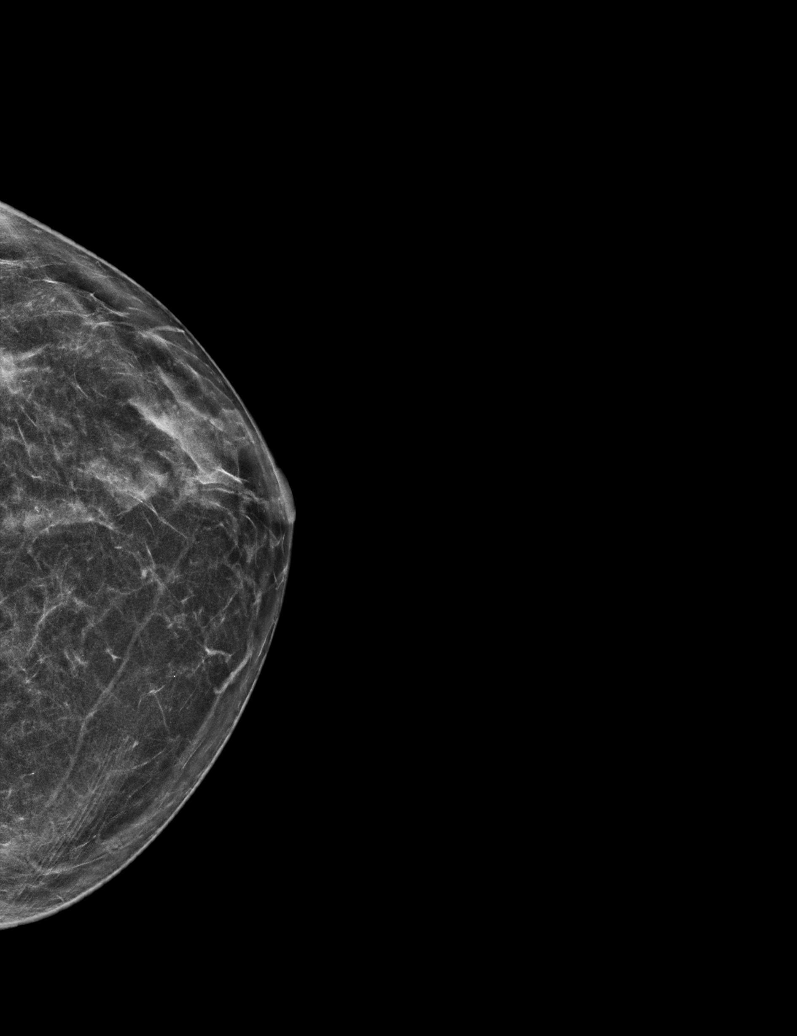

[R CC synth-2D]
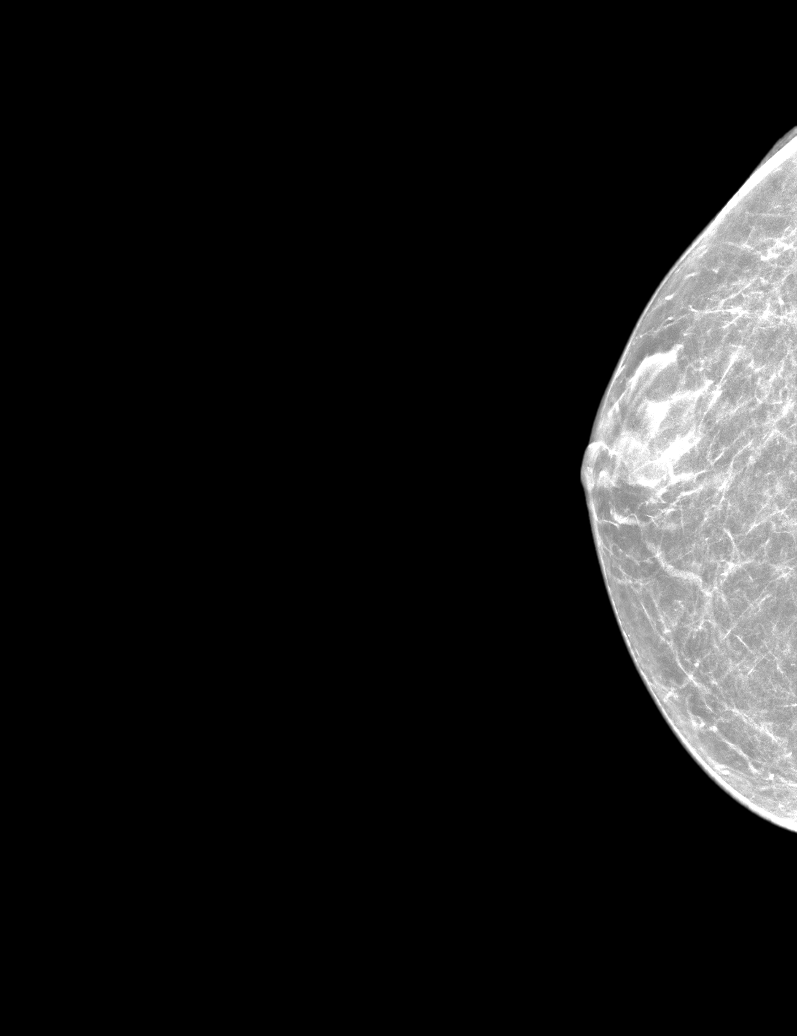

[L MLO synth-2D]
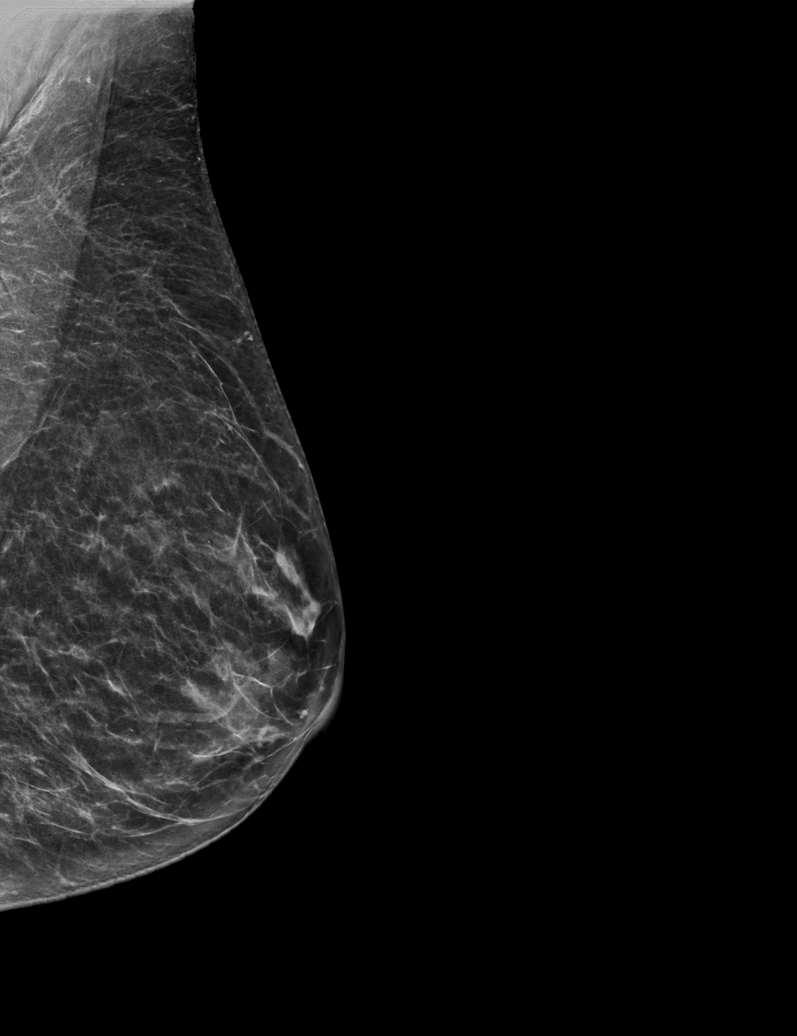

[L XCCL synth-2D]
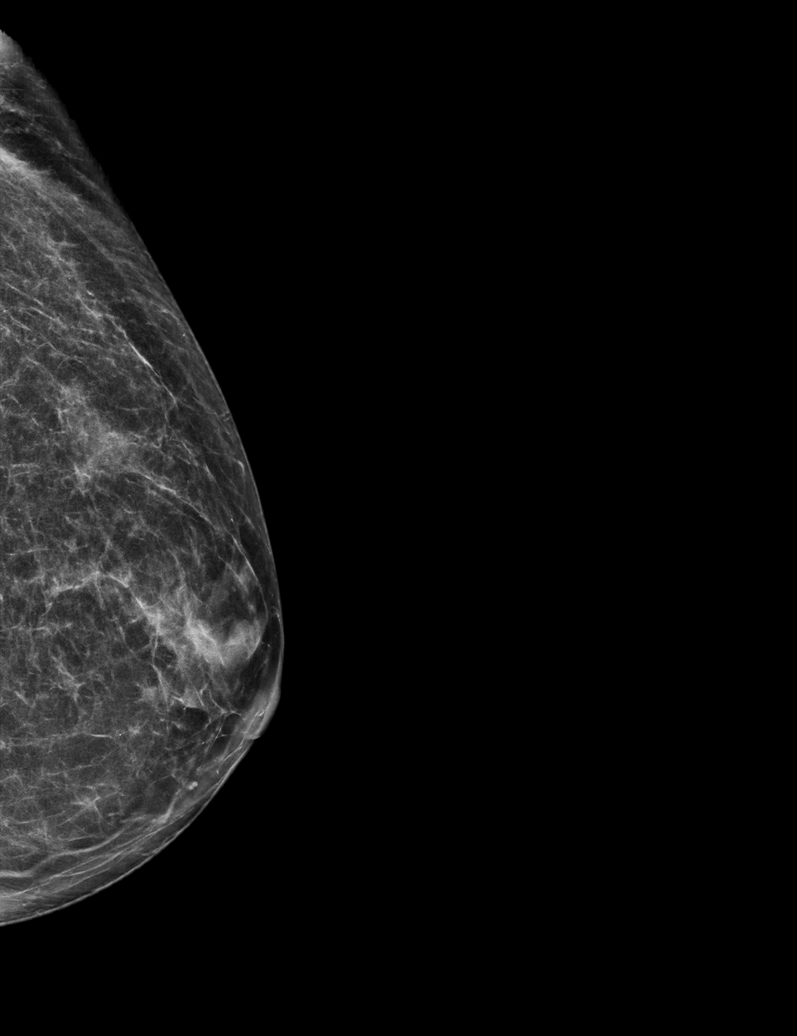

[R MLO synth-2D]
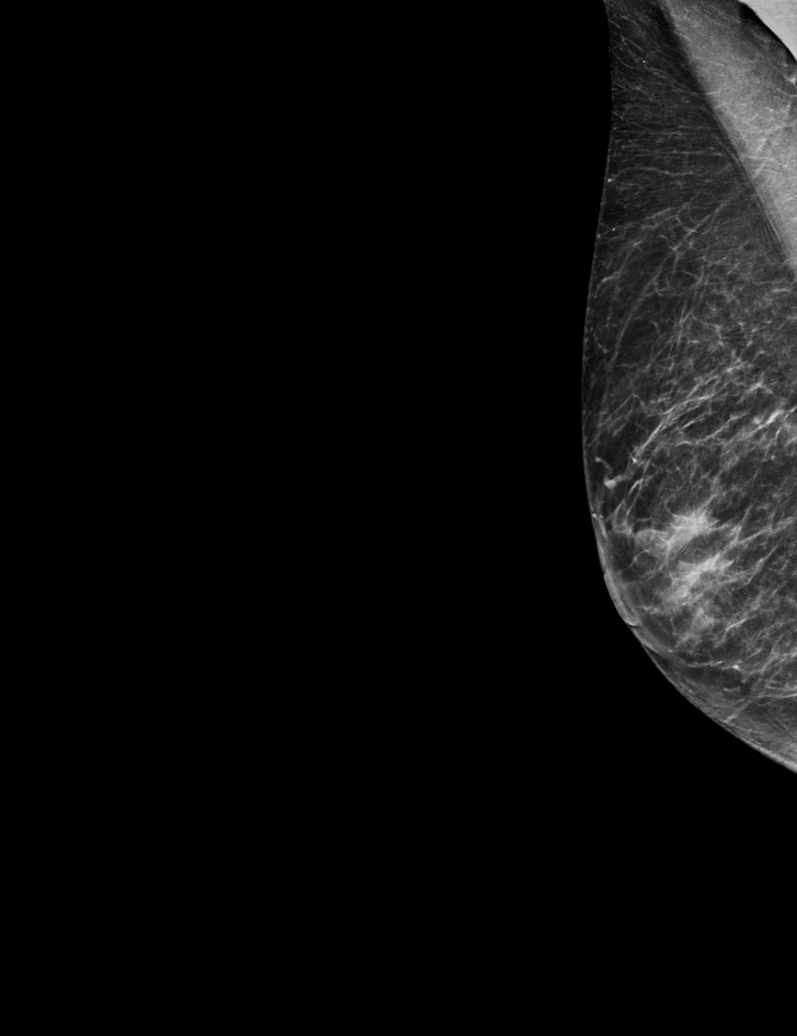

[R MLO tomo · tomo slice 30/59.0]
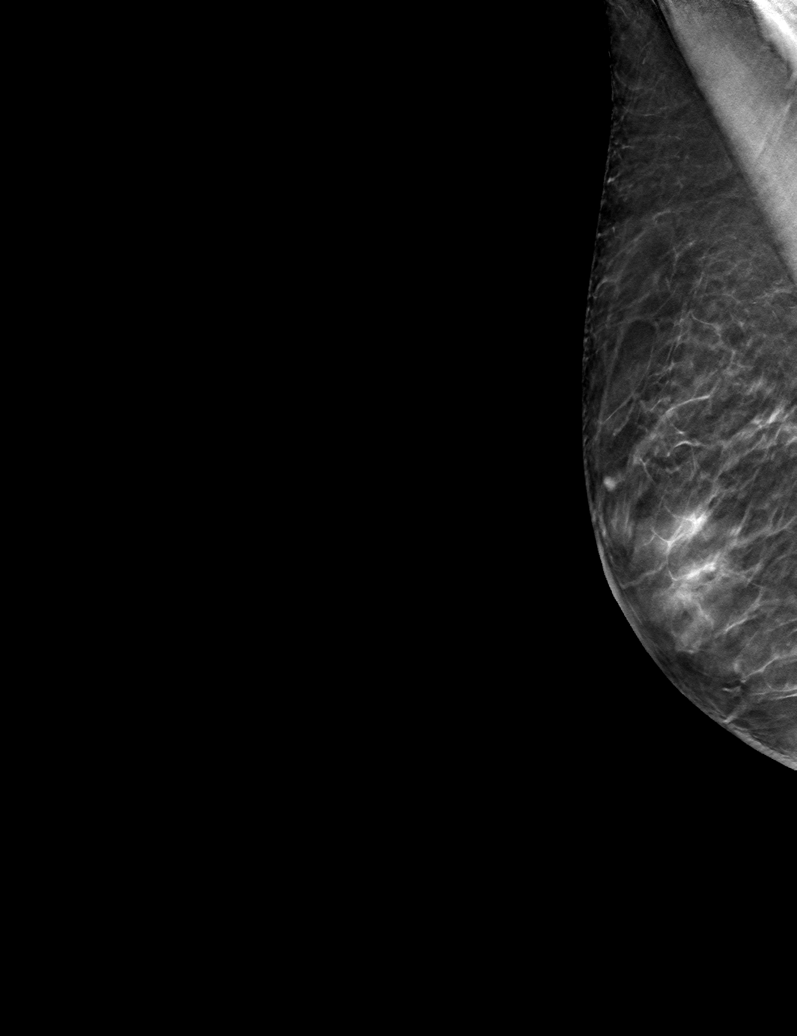

[6 of 30 positions shown; findings below may reference images not displayed]

ACR Breast Density Category b: There are scattered areas of
fibroglandular density.
FINDINGS: There are no findings suspicious for malignancy.
IMPRESSION: No mammographic evidence of malignancy. A result letter of this
screening mammogram will be mailed directly to the patient.

RECOMMENDATION:
Screening mammogram in one year. (Code:51-O-LD2)

BI-RADS CATEGORY  1: Negative.

## 2022-08-20 ENCOUNTER — Other Ambulatory Visit: Payer: Self-pay

## 2022-08-20 ENCOUNTER — Ambulatory Visit
Admission: RE | Admit: 2022-08-20 | Discharge: 2022-08-20 | Disposition: A | Discharge status: Home / Self Care | Payer: Medicare Other | Source: Ambulatory Visit | Attending: Family Medicine | Admitting: Family Medicine

## 2022-08-20 DIAGNOSIS — Z1231 Encounter for screening mammogram for malignant neoplasm of breast: Secondary | ICD-10-CM | POA: Insufficient documentation

## 2022-08-21 ENCOUNTER — Other Ambulatory Visit: Payer: Self-pay

## 2022-08-24 ENCOUNTER — Other Ambulatory Visit: Payer: Self-pay

## 2022-08-28 ENCOUNTER — Other Ambulatory Visit: Payer: Self-pay | Admitting: Family Medicine

## 2022-08-28 DIAGNOSIS — N6489 Other specified disorders of breast: Secondary | ICD-10-CM

## 2022-08-28 DIAGNOSIS — R928 Other abnormal and inconclusive findings on diagnostic imaging of breast: Secondary | ICD-10-CM

## 2022-08-31 ENCOUNTER — Other Ambulatory Visit: Payer: Self-pay

## 2022-09-01 ENCOUNTER — Other Ambulatory Visit: Payer: Self-pay

## 2022-09-02 ENCOUNTER — Encounter: Payer: Self-pay | Admitting: Student

## 2022-09-02 ENCOUNTER — Ambulatory Visit: Payer: Medicare Other | Admitting: Student

## 2022-09-02 VITALS — BP 118/78 | HR 82 | Temp 98.3°F | Ht 63.0 in | Wt 121.0 lb

## 2022-09-02 DIAGNOSIS — K5903 Drug induced constipation: Secondary | ICD-10-CM

## 2022-09-02 DIAGNOSIS — H04123 Dry eye syndrome of bilateral lacrimal glands: Secondary | ICD-10-CM

## 2022-09-02 DIAGNOSIS — D649 Anemia, unspecified: Secondary | ICD-10-CM

## 2022-09-02 DIAGNOSIS — G894 Chronic pain syndrome: Secondary | ICD-10-CM

## 2022-09-02 DIAGNOSIS — L719 Rosacea, unspecified: Secondary | ICD-10-CM

## 2022-09-02 DIAGNOSIS — G629 Polyneuropathy, unspecified: Secondary | ICD-10-CM | POA: Diagnosis not present

## 2022-09-02 DIAGNOSIS — H61009 Unspecified perichondritis of external ear, unspecified ear: Secondary | ICD-10-CM

## 2022-09-02 DIAGNOSIS — R0602 Shortness of breath: Secondary | ICD-10-CM | POA: Insufficient documentation

## 2022-09-02 DIAGNOSIS — R232 Flushing: Secondary | ICD-10-CM | POA: Diagnosis not present

## 2022-09-02 DIAGNOSIS — K219 Gastro-esophageal reflux disease without esophagitis: Secondary | ICD-10-CM

## 2022-09-02 DIAGNOSIS — H9110 Presbycusis, unspecified ear: Secondary | ICD-10-CM | POA: Insufficient documentation

## 2022-09-02 DIAGNOSIS — R682 Dry mouth, unspecified: Secondary | ICD-10-CM | POA: Insufficient documentation

## 2022-09-02 MED ORDER — PREMPRO 0.3-1.5 MG PO TABS: 1.0000 | ORAL_TABLET | Freq: Every day | ORAL | 1 refills | Status: DC

## 2022-09-02 NOTE — Progress Notes (Signed)
[facility].   Provider: Dr. Earnestine MealingVictoria Jaidan Stachnik  Code Status: Full Code Goals of Care:     09/02/2022    1:05 PM  Advanced Directives  Does Patient Have a Medical Advance Directive? Yes  Type of Estate agentAdvance Directive Healthcare Power of ReserveAttorney;Living will  Does patient want to make changes to medical advance directive? No - Patient declined  Copy of Healthcare Power of Attorney in Chart? No - copy requested     Chief Complaint  Patient presents with   Establish Care    To Establish Care   Quality Metric Gaps    To discuss need for AWV, Zoster and Covid vaccine.     HPI: Patient is a 82 y.o. female seen today to Establish Care.   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 They are recommending a diagnostic mammogram. She is open to discussion regarding biopsy.   She would like to stop baby ASA-- wants second opinion. Agree. No history of MI or CVA.   Shortness of breath - on exertion. Feels dizzy and short of breath. She knows she doesn't drink enough water - 1 c of coffee. 10 oz of water at lunch and during the afternoon. She may get 35-40 oz per day. She hasn't been able to drink the 64 oz of water. ENT? Cardiology, Endocrinology, etc. ENT though the dizziness was not from ENT. This came up right before her. Shortness of breath and then dizziness to the point of needing assistance.   Anemia-- hemoglobin 10.8 on 12/29/2021. No work up.  Vaginal and bladder prolapse -- went to [facility] to have colpopexy-- on the way home she hemorrhaged in the car. She had emergency surgery and she has since had hysterectomy and that went fine.   Pain management - alpha lipoic acid, buprenorphine -- Ultram 150 mg nightly and 50 mg in the morning.   Memory - She forgets new names and sometimes names in books, but typically no concerns. SLUMS today  27/30.   She used to weigh 140, has been stable at 120, but A1c has increased.   She has hot flashes still. She wears low-dose patch that she changes once a week to help with her quality of life. She was taking the pills previously and did it every other day, then one every 3 days and then started to go year round again. She alternates which side every other week. She started to have issue with irritation around the skin.   Question of Gout - It comes and goes. Off and on. Tender toe.   She lives here with her husband Nadine Counts(Bob). They retired to [employer] [place] for 20 years before moving here. She [place] two daughters -- [place] in [place]. She has been here 3.5 years. 'She usually gets 6 month labs. 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 Surgery has helped with her symptoms. She has had the issue for 3.5 years. She was going to be 2 months out from her next appointment. She does a lot of gardening. She goes to two exercise classes here. She reads a lot - doesn't always remember the names of the characters. Following the plot as well.   Mobility - no falls, she goes to living with Marisue IvanLiz  and [facility]. She walks but she has some pain in feet and legs which makes it difficult. 45 minutes nap every day at 4PM.   Alcohol? Almost, none. No tobacco products. No recreational drugs.  Past Medical History:  Diagnosis Date   Small fiber polyneuropathy    Spondylolysis     Past Surgical History:  Procedure Laterality Date   ABDOMINAL HYSTERECTOMY     back pain     lumbar   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    Allergies  Allergen Reactions   Sumatriptan Anxiety, Other (See Comments) and Palpitations     Felt weak  Felt weak  Felt weak  Felt weak    Penicillin G Anxiety and Other (See Comments)    Reaction unknown given a lot of it as a child. Was told not to take anymore of it Reaction unknown given a lot of it as a child. Was  told not to take anymore of it Reaction unknown given a lot of it as a child. Was told not to take anymore of it Reaction unknown given a lot of it as a child. Was told not to take anymore of it    Topiramate Other (See Comments)   Ilosone [Erythromycin] Other (See Comments)    Outpatient Encounter Medications as of 09/02/2022  Medication Sig   Alpha-Lipoic Acid 600 MG CAPS Take one by mouth once daily.   atorvastatin (LIPITOR) 20 MG tablet Take by mouth daily.   buprenorphine (BUTRANS) 20 MCG/HR PTWK Place 1 patch onto the skin once a week. For chronic pain syndrome. Ok for pt to fill up to 3 days earlier than fill date.   Calcium Carbonate (CALCIUM 500 PO) Take one by mouth once daily.   Cholecalciferol (VITAMIN D3) 50 MCG (2000 UT) TABS Take one tablet by mouth daily.   Coenzyme Q10-Vitamin E (QUNOL ULTRA COQ10 PO) Take by mouth daily.   Cyanocobalamin (VITAMIN B 12 PO) Take by mouth daily.   estradiol (VIVELLE-DOT) 0.025 MG/24HR Place 1 patch onto the skin every 7 (seven) days.   lubiprostone (AMITIZA) 8 MCG capsule Take 8 mcg by mouth 2 (two) times daily as needed for constipation.   Magnesium 200 MG CHEW Chew by mouth daily.   Multiple Vitamins-Minerals (MULTIVITAMIN ADULT) CHEW Chew by mouth daily.   pantoprazole (PROTONIX) 20 MG tablet Take 20 mg by mouth daily.   PROLIA 60 MG/ML SOSY injection Inject 60 mg into the skin as needed. Twice a year.   traMADol (ULTRAM) 50 MG tablet Take one tablet by mouth in the morning and take Two and a Half tablet by mouth at bedtime   [DISCONTINUED] ASPIRIN 81 PO Take by mouth daily.   [DISCONTINUED] Pyridoxine HCl (VITAMIN B-6 PO) Take by mouth daily.   [DISCONTINUED] sodium fluoride (PREVIDENT 5000 PLUS) 1.1 % CREA dental cream Take 1 application by mouth daily. (Patient not taking: Reported on 08/04/2022)   [DISCONTINUED] traMADol (ULTRAM) 50 MG tablet Take 1 tablet (50 mg total) by mouth 2 (two) times daily. 50mg  in day and 50mg  at night    [DISCONTINUED] traMADol (ULTRAM-ER) 100 MG 24 hr tablet Take 1 tablet (100 mg total) by mouth at bedtime. Ok for pt to fill up to 3 days earlier than fill date.   No facility-administered encounter medications on file as of 09/02/2022.    Review of Systems:  Review of Systems  Health Maintenance  Topic Date Due   Medicare Annual Wellness (AWV)  Never done   Zoster Vaccines- Shingrix (  1 of 2) Never done   COVID-19 Vaccine (5 - 2023-24 season) 01/23/2022   INFLUENZA VACCINE  12/24/2022   DTaP/Tdap/Td (2 - Td or Tdap) 12/20/2028   Pneumonia Vaccine 5+ Years old  Completed   DEXA SCAN  Completed   HPV VACCINES  Aged Out    Physical Exam: Vitals:   09/02/22 1253  BP: 118/78  Pulse: 82  Temp: 98.3 F (36.8 C)  SpO2: 96%  Weight: 121 lb (54.9 kg)  Height: 5\' 3"  (1.6 m)   Body mass index is 21.43 kg/m. Physical Exam Constitutional:      Appearance: Normal appearance.  Cardiovascular:     Rate and Rhythm: Normal rate and regular rhythm.     Pulses: Normal pulses.     Heart sounds: Normal heart sounds.  Pulmonary:     Effort: Pulmonary effort is normal.  Abdominal:     General: Abdomen is flat. Bowel sounds are normal.     Palpations: Abdomen is soft.  Musculoskeletal:        General: No swelling or tenderness.  Skin:    General: Skin is warm and dry.     Capillary Refill: Capillary refill takes 2 to 3 seconds.  Neurological:     Mental Status: She is alert and oriented to person, place, and time.     Gait: Gait normal.  Psychiatric:        Mood and Affect: Mood normal.     Labs reviewed: Basic Metabolic Panel: No results for input(s): "NA", "K", "CL", "CO2", "GLUCOSE", "BUN", "CREATININE", "CALCIUM", "MG", "PHOS", "TSH" in the last 8760 hours. Liver Function Tests: No results for input(s): "AST", "ALT", "ALKPHOS", "BILITOT", "PROT", "ALBUMIN" in the last 8760 hours. No results for input(s): "LIPASE", "AMYLASE" in the last 8760 hours. No results for input(s):  "AMMONIA" in the last 8760 hours. CBC: No results for input(s): "WBC", "NEUTROABS", "HGB", "HCT", "MCV", "PLT" in the last 8760 hours. Lipid Panel: No results for input(s): "CHOL", "HDL", "LDLCALC", "TRIG", "CHOLHDL", "LDLDIRECT" in the last 8760 hours. No results found for: "HGBA1C"  Procedures since last visit: MM 3D SCREEN BREAST BILATERAL  Result Date: 08/21/2022 CLINICAL DATA:  Screening. EXAM: DIGITAL SCREENING BILATERAL MAMMOGRAM WITH TOMOSYNTHESIS AND CAD TECHNIQUE: Bilateral screening digital craniocaudal and mediolateral oblique mammograms were obtained. Bilateral screening digital breast tomosynthesis was performed. The images were evaluated with computer-aided detection. COMPARISON:  Previous exam(s). ACR Breast Density Category b: There are scattered areas of fibroglandular density. FINDINGS: In the left breast, a possible asymmetry warrants further evaluation. In the right breast, no findings suspicious for malignancy. IMPRESSION: Further evaluation is suggested for possible asymmetry in the left breast. RECOMMENDATION: Diagnostic mammogram and possibly ultrasound of the left breast. (Code:[id number]) The patient will be contacted regarding the findings, and additional imaging will be scheduled. BI-RADS CATEGORY  0: Incomplete: Need additional imaging evaluation. Electronically Signed   By: Ted Mcalpine M.D.   On: 08/21/2022 16:37    Assessment/Plan 1. Shortness of breath Patient has had chronic shortness of breath with exertion. No wheezes on exam. Some concern it is anemia related-- last hgb 10.8 in 12/2021. Previously discussed potential cardiac involvement with PCP. Will request a cardiology consultation for further work up with echocardiogram, etc.   2. Hot flashes Patient's symptoms well-controlled with HRT. She is interested in trialing discontinuation. Discussed transitioning to oral formulation due to potentially simpler way of performing slow taper. Patient will need to  decrease dose every 4 weeks.   3. Small fiber neuropathy 4.  Chronic pain syndrome Stable, no acute concerns. Managed with pain management.   5. GERD without esophagitis Symptoms well-controlled. Continue current medications  6. Chondrodermatitis nodularis helicis, unspecified laterality Well-controlled with over the counter moisturizer  7. Rosacea No symptoms at this time, continue to monitor  8. Presbycusis, unspecified laterality Followed by audiology for hearing aids. No concerns at this time. CTM.   9. Dry eyes Artificial tears prn.   10. Dry mouth Symptoms well-controlled   11, Drug -induced, constipation. Constipation likely due to chronic pain medications. Continue PRN medications as previously recommended.   Labs/tests ordered:  * No order type specified * Next appt:  Visit date not found  I spent 1 hour in face to face time with this patient including acute cncerns for hormonal replacement therapy, goals of care regarding future mammography, as well as memory assessment which was normal today.

## 2022-09-02 NOTE — Patient Instructions (Signed)
Great meeting you today!  We are changing you to oral contraceptives. We can follow up in 1 month to see how you are doing with the medication.  Please come to the clinic on Monday at 7:30 AM to have labs collected.   I will keep an eye out for your mammography results.

## 2022-09-04 ENCOUNTER — Ambulatory Visit
Admission: RE | Admit: 2022-09-04 | Discharge: 2022-09-04 | Disposition: A | Discharge status: Home / Self Care | Payer: Medicare Other | Source: Ambulatory Visit | Attending: Family Medicine | Admitting: Family Medicine

## 2022-09-04 DIAGNOSIS — R928 Other abnormal and inconclusive findings on diagnostic imaging of breast: Secondary | ICD-10-CM

## 2022-09-04 DIAGNOSIS — N6489 Other specified disorders of breast: Secondary | ICD-10-CM | POA: Insufficient documentation

## 2022-09-09 ENCOUNTER — Telehealth: Payer: Self-pay | Admitting: *Deleted

## 2022-09-09 NOTE — Telephone Encounter (Signed)
Patient notified and agreed.  

## 2022-09-09 NOTE — Telephone Encounter (Signed)
-----   Message from [name], MD sent at 09/09/2022  6:23 AM EDT ----- Regarding: lab results Please call patient to inform her that recent labs were normal except for a slight anemia. (Hgb 11.3 when normal is 11.5). This is something we can recheck in 1-2 months, however, no interventions are necessary at this time.  ----- Message ----- From: Elveria Royals, CMA Sent: 09/08/2022  12:52 PM EDT To: [name], MD  Abstracted

## 2022-09-11 ENCOUNTER — Other Ambulatory Visit: Payer: Self-pay

## 2022-09-16 ENCOUNTER — Telehealth: Payer: Self-pay | Admitting: Student in an Organized Health Care Education/Training Program

## 2022-09-16 ENCOUNTER — Other Ambulatory Visit (HOSPITAL_COMMUNITY): Payer: Self-pay

## 2022-09-16 ENCOUNTER — Other Ambulatory Visit: Payer: Self-pay | Admitting: Student in an Organized Health Care Education/Training Program

## 2022-09-16 ENCOUNTER — Other Ambulatory Visit: Payer: Self-pay

## 2022-09-16 DIAGNOSIS — M47816 Spondylosis without myelopathy or radiculopathy, lumbar region: Secondary | ICD-10-CM

## 2022-09-16 DIAGNOSIS — G894 Chronic pain syndrome: Secondary | ICD-10-CM

## 2022-09-16 DIAGNOSIS — M48062 Spinal stenosis, lumbar region with neurogenic claudication: Secondary | ICD-10-CM

## 2022-09-16 DIAGNOSIS — Z79891 Long term (current) use of opiate analgesic: Secondary | ICD-10-CM

## 2022-09-16 MED ORDER — TRAMADOL HCL 50 MG PO TABS
50.0000 mg | ORAL_TABLET | Freq: Two times a day (BID) | ORAL | 2 refills | Status: DC
  Filled 2022-09-16: qty 60, 30d supply, fill #0

## 2022-09-16 MED ORDER — TRAMADOL HCL ER 100 MG PO TB24
100.0000 mg | ORAL_TABLET | Freq: Every day | ORAL | 2 refills | Status: DC
Start: 2022-09-16 — End: 2022-10-29
  Filled 2022-09-16: qty 30, 30d supply, fill #0
  Filled 2022-10-20: qty 30, 30d supply, fill #1

## 2022-09-16 NOTE — Telephone Encounter (Signed)
Patient states that [facility] and they told her that she does not have refills on her current prescriptions. Can you please check on this and give pt a call back. Thanks.

## 2022-09-16 NOTE — Telephone Encounter (Signed)
The current Rx for Tramadol and Tramadol ER was cancelled by Synetta Fail May, CNA at another provider's office. I sent a Rx request to Dr. Laban Emperor.

## 2022-09-16 NOTE — Telephone Encounter (Signed)
Patient states that she [facility] and they told her they do not have any orders for a refill for her for either of her prescriptions. Can you please see what's going on with pts prescriptions and give her a call. Thanks

## 2022-09-17 ENCOUNTER — Other Ambulatory Visit: Payer: Self-pay

## 2022-09-18 ENCOUNTER — Other Ambulatory Visit: Payer: Self-pay

## 2022-09-28 ENCOUNTER — Ambulatory Visit: Payer: Medicare Other | Admitting: Student

## 2022-09-28 VITALS — BP 136/84 | HR 76 | Temp 97.5°F | Ht 63.0 in | Wt 119.0 lb

## 2022-09-28 DIAGNOSIS — G5703 Lesion of sciatic nerve, bilateral lower limbs: Secondary | ICD-10-CM

## 2022-09-28 DIAGNOSIS — K219 Gastro-esophageal reflux disease without esophagitis: Secondary | ICD-10-CM

## 2022-09-28 DIAGNOSIS — D649 Anemia, unspecified: Secondary | ICD-10-CM

## 2022-09-28 DIAGNOSIS — M5432 Sciatica, left side: Secondary | ICD-10-CM

## 2022-09-28 MED ORDER — PANTOPRAZOLE SODIUM 40 MG PO TBEC: 40.0000 mg | DELAYED_RELEASE_TABLET | Freq: Every day | ORAL | 3 refills | Status: DC

## 2022-09-28 NOTE — Progress Notes (Addendum)
[facility].   Provider: Dr. [name]  Code Status: Full Code Goals of Care:     10/02/2022    1:35 PM  Advanced Directives  Does Patient Have a Medical Advance Directive? Yes  Type of Estate agent of [place];Living will  Does patient want to make changes to medical advance directive? No - Patient declined  Copy of Healthcare Power of Attorney in Chart? Yes - validated most recent copy scanned in chart (See row information)    Chief Complaint  Patient presents with   Acute Visit    Pain in Hips and Legs for a week   Quality Metric Gaps    To discuss need for AWV and Zoster.     HPI: Patient is a 82 y.o. female seen today for an acute visit for Pain in Hips and Leg for a week She's having more sciatica. This is the first time it's really flared since COVID. January 2021 PT opened here on campus and that helped a lot with the sciatica. Fitness classes also helped with her pain. Then 2 weeks ago she started to have more pain.   They are leaving for grandson's graduation in 1.5 weeks. She has her exercises from physical therapy and she has been doing that for a week.  She is hoping to get back into physical therapy.   After she had Zenker's which was successful but she is having more indigestion issues again -- burping and reflux.   Past Medical History:  Diagnosis Date   Small fiber polyneuropathy    Spondylolysis     Past Surgical History:  Procedure Laterality Date   ABDOMINAL HYSTERECTOMY     back pain     lumbar   RADICAL VAGINAL HYSTERECTOMY  2017   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    Allergies  Allergen Reactions   Sumatriptan Anxiety, Other (See Comments) and Palpitations     Felt weak  Felt weak  Felt weak  Felt weak    Penicillin G Anxiety and Other (See Comments)    Reaction unknown given a lot of it as a child. Was told not to take anymore of it Reaction unknown given a lot of it as a child. Was  told not to take anymore of it Reaction unknown given a lot of it as a child. Was told not to take anymore of it Reaction unknown given a lot of it as a child. Was told not to take anymore of it    Topiramate Other (See Comments)   Ilosone [Erythromycin] Other (See Comments)    Outpatient Encounter Medications as of 09/28/2022  Medication Sig   Alpha-Lipoic Acid 600 MG CAPS Take one by mouth once daily.   atorvastatin (LIPITOR) 20 MG tablet Take by mouth daily.   buprenorphine (BUTRANS) 20 MCG/HR PTWK Place 1 patch onto the skin once a week. For chronic pain syndrome. Ok for pt to fill up to 3 days earlier than fill date.   Calcium Carbonate (CALCIUM 500 PO) Take one by mouth once daily.   Cholecalciferol (VITAMIN D3) 50 MCG (2000 UT) TABS Take one tablet by mouth daily.   Coenzyme Q10-Vitamin E (QUNOL ULTRA COQ10 PO) Take by mouth daily.   Cyanocobalamin (VITAMIN B 12 PO) Take by mouth daily.   estrogen, conjugated,-medroxyprogesterone (PREMPRO) 0.3-1.5 MG tablet Take 1 tablet by mouth daily.   lubiprostone (AMITIZA) 8 MCG capsule Take 8 mcg by mouth 2 (two) times daily as needed for constipation.  Magnesium 200 MG CHEW Chew by mouth daily.   Multiple Vitamins-Minerals (MULTIVITAMIN ADULT) CHEW Chew by mouth daily.   PROLIA 60 MG/ML SOSY injection Inject 60 mg into the skin as needed. Twice a year.   traMADol (ULTRAM) 50 MG tablet Take one tablet by mouth in the morning and take Two and a Half tablet by mouth at bedtime   traMADol (ULTRAM-ER) 100 MG 24 hr tablet Take 1 tablet (100 mg total) by mouth at bedtime. Ok for pt to fill up to 3 days earlier than fill date.   [DISCONTINUED] pantoprazole (PROTONIX) 20 MG tablet Take 20 mg by mouth daily.   [DISCONTINUED] traMADol (ULTRAM) 50 MG tablet Take 1 tablet (50 mg total) by mouth 2 (two) times daily. 50mg  in day and 50mg  at night   pantoprazole (PROTONIX) 40 MG tablet Take 1 tablet (40 mg total) by mouth daily.   No facility-administered  encounter medications on file as of 09/28/2022.    Review of Systems:  Review of Systems  Health Maintenance  Topic Date Due   Medicare Annual Wellness (AWV)  Never done   Zoster Vaccines- Shingrix (1 of 2) Never done   COVID-19 Vaccine (5 - 2023-24 season) 10/19/2025 (Originally 01/23/2022)   INFLUENZA VACCINE  12/24/2022   DTaP/Tdap/Td (2 - Td or Tdap) 12/20/2028   Pneumonia Vaccine 43+ Years old  Completed   DEXA SCAN  Completed   HPV VACCINES  Aged Out    Physical Exam: Vitals:   09/28/22 1258  BP: 136/84  Pulse: 76  Temp: (!) 97.5 F (36.4 C)  SpO2: 96%  Weight: 119 lb (54 kg)  Height: 5\' 3"  (1.6 m)   Body mass index is 21.08 kg/m. Physical Exam Constitutional:      Appearance: Normal appearance.  Musculoskeletal:     Comments: Left leg exam, pain with external rotation of the hip. Right with minimal pain with internal and external rotation of the hip.   Neurological:     Mental Status: She is alert.     Labs reviewed: Basic Metabolic Panel: Recent Labs    06/08/22 0000  NA 141  CL 105  CO2 30*  BUN 22*  CREATININE 0.9  CALCIUM 8.9   Liver Function Tests: Recent Labs    06/08/22 0000  AST 21  ALT 14  ALKPHOS 37  ALBUMIN 4.0   No results for input(s): "LIPASE", "AMYLASE" in the last 8760 hours. No results for input(s): "AMMONIA" in the last 8760 hours. CBC: Recent Labs    06/08/22 0000  WBC 5.7  NEUTROABS 3,232.00  HGB 11.3*  HCT 35*   Lipid Panel: No results for input(s): "CHOL", "HDL", "LDLCALC", "TRIG", "CHOLHDL", "LDLDIRECT" in the last 8760 hours. No results found for: "HGBA1C"  Procedures since last visit: No results found.  Assessment/Plan Piriformis syndrome of both sides - Plan: Ambulatory referral to Physical Therapy  GERD without esophagitis - Plan: pantoprazole (PROTONIX) 40 MG tablet  Anemia, unspecified type Patient with worsening piriformis pain. Discussed exercises and pain management with eating, external creams.  Restart physical therapy. F/u 3 mo. Worsening GERD symptoms, increase Protonix dose. Cardiology for dyspnea on exertion.     Labs/tests ordered:  * No order type specified * Next appt:  10/02/2022

## 2022-09-28 NOTE — Patient Instructions (Addendum)
You should be able to get in to physical therapy quickly. If you are nearing the graduation and need more support for pain relief, please send me a mychart message and we can do a few days of prednisone to help with any inflammation and pain control.   "Yoga with Hansel Starling - Cyclist Stretching" [email or web address]

## 2022-10-02 ENCOUNTER — Ambulatory Visit: Payer: Medicare Other | Admitting: Student

## 2022-10-02 ENCOUNTER — Encounter: Payer: Self-pay | Admitting: Student

## 2022-10-02 VITALS — BP 120/76 | HR 94 | Temp 97.5°F | Ht 63.0 in | Wt 120.0 lb

## 2022-10-02 DIAGNOSIS — T148XXA Other injury of unspecified body region, initial encounter: Secondary | ICD-10-CM | POA: Diagnosis not present

## 2022-10-02 NOTE — Progress Notes (Signed)
[facility].   Provider: Dr. [name]  Code Status: Full Code Goals of Care:     10/02/2022    1:35 PM  Advanced Directives  Does Patient Have a Medical Advance Directive? Yes  Type of Estate agent of Sheila Newton;Living will  Does patient want to make changes to medical advance directive? No - Patient declined  Copy of Healthcare Power of Attorney in Chart? Yes - validated most recent copy scanned in chart (See row information)     Chief Complaint  Patient presents with  . Acute Visit    Complains of Left ankle swollen and painful. No trauma    HPI: Patient is a 82 y.o. female seen today for an acute visit for Left Ankle Swollen and Painful since Tuesday. No Trauma  Monday she had the hip pain. Tuesday she started to have significant pain in the ankle. It's been hurting since then.   No known injury. She initially thought it was sciatica pain.   No history of injury. No rash, etc.   She has been elevating the leg. She was having a lot of pain while baking In the morning. When she starts to do activity she rests. She doesn't think of swollen ankle Past Medical History:  Diagnosis Date  . Small fiber polyneuropathy   . Spondylolysis     Past Surgical History:  Procedure Laterality Date  . ABDOMINAL HYSTERECTOMY    . back pain     lumbar  . RADICAL VAGINAL HYSTERECTOMY  2017  . 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 Sumatriptan Anxiety, Other (See Comments) and Palpitations     Felt weak  Felt weak  Felt weak  Felt weak   . Penicillin G Anxiety and Other (See Comments)    Reaction unknown given a lot of it as a child. Was told not to take anymore of it Reaction unknown given a lot of it as a child. Was told not to take anymore of it Reaction unknown given a lot of it as a child. Was told not to take anymore of it Reaction unknown given a lot of it as a child. Was told not to take  anymore of it   . Topiramate Other (See Comments)  . Ilosone [Erythromycin] Other (See Comments)    Outpatient Encounter Medications as of 10/02/2022  Medication Sig  . Alpha-Lipoic Acid 600 MG CAPS Take one by mouth once daily.  Marland Kitchen atorvastatin (LIPITOR) 20 MG tablet Take by mouth daily.  . buprenorphine (BUTRANS) 20 MCG/HR PTWK Place 1 patch onto the skin once a week. For chronic pain syndrome. Ok for pt to fill up to 3 days earlier than fill date.  . Calcium Carbonate (CALCIUM 500 PO) Take one by mouth once daily.  . Cholecalciferol (VITAMIN D3) 50 MCG (2000 UT) TABS Take one tablet by mouth daily.  . Coenzyme Q10-Vitamin E (QUNOL ULTRA COQ10 PO) Take by mouth daily.  . Cyanocobalamin (VITAMIN B 12 PO) Take by mouth daily.  Marland Kitchen estrogen, conjugated,-medroxyprogesterone (PREMPRO) 0.3-1.5 MG tablet Take 1 tablet by mouth daily.  Marland Kitchen lubiprostone (AMITIZA) 8 MCG capsule Take 8 mcg by mouth 2 (two) times daily as needed for constipation.  . Magnesium 200 MG CHEW Chew by mouth daily.  . Multiple Vitamins-Minerals (MULTIVITAMIN ADULT) CHEW Chew by mouth daily.  . pantoprazole (PROTONIX) 40 MG tablet Take 1 tablet (40 mg total) by mouth daily.  Marland Kitchen PROLIA 60 MG/ML SOSY  injection Inject 60 mg into the skin as needed. Twice a year.  . traMADol (ULTRAM) 50 MG tablet Take one tablet by mouth in the morning and take Two and a Half tablet by mouth at bedtime  . traMADol (ULTRAM) 50 MG tablet Take 1 tablet (50 mg total) by mouth 2 (two) times daily. 50mg  in day and 50mg  at night  . traMADol (ULTRAM-ER) 100 MG 24 hr tablet Take 1 tablet (100 mg total) by mouth at bedtime. Ok for pt to fill up to 3 days earlier than fill date.   No facility-administered encounter medications on file as of 10/02/2022.    Review of Systems:  Review of Systems  Health Maintenance  Topic Date Due  . Medicare Annual Wellness (AWV)  Never done  . Zoster Vaccines- Shingrix (1 of 2) Never done  . COVID-19 Vaccine (5 - 2023-24  season) 10/19/2025 (Originally 01/23/2022)  . INFLUENZA VACCINE  12/24/2022  . DTaP/Tdap/Td (2 - Td or Tdap) 12/20/2028  . Pneumonia Vaccine 65+ Years old  Completed  . DEXA SCAN  Completed  . HPV VACCINES  Aged Out    Physical Exam: Vitals:   10/02/22 1333  BP: 120/76  Pulse: 94  Temp: (!) 97.5 F (36.4 C)  SpO2: 97%  Weight: 120 lb (54.4 kg)  Height: 5\' 3"  (1.6 m)   Body mass index is 21.26 kg/m. Physical Exam  Labs reviewed: Basic Metabolic Panel: Recent Labs    06/08/22 0000  NA 141  CL 105  CO2 30*  BUN 22*  CREATININE 0.9  CALCIUM 8.9   Liver Function Tests: Recent Labs    06/08/22 0000  AST 21  ALT 14  ALKPHOS 37  ALBUMIN 4.0   No results for input(s): "LIPASE", "AMYLASE" in the last 8760 hours. No results for input(s): "AMMONIA" in the last 8760 hours. CBC: Recent Labs    06/08/22 0000  WBC 5.7  NEUTROABS 3,232.00  HGB 11.3*  HCT 35*   Lipid Panel: No results for input(s): "CHOL", "HDL", "LDLCALC", "TRIG", "CHOLHDL", "LDLDIRECT" in the last 8760 hours. No results found for: "HGBA1C"  Procedures since last visit: MM 3D DIAGNOSTIC MAMMOGRAM UNILATERAL LEFT BREAST  Result Date: 09/04/2022 CLINICAL DATA:  Recalled for left breast asymmetry. EXAM: DIGITAL DIAGNOSTIC UNILATERAL LEFT MAMMOGRAM WITH TOMOSYNTHESIS; ULTRASOUND LEFT BREAST LIMITED TECHNIQUE: Left digital diagnostic mammography and breast tomosynthesis was performed.; Targeted ultrasound examination of the left breast was performed. COMPARISON:  Previous exam(s). ACR Breast Density Category b: There are scattered areas of fibroglandular density. FINDINGS: Asymmetry within the upper-outer left breast predominately effaced with additional imaging suggestive of dense fibroglandular tissue. On physical exam, no mass is palpated upper-outer left breast. Targeted ultrasound is performed, showing normal dense tissue without suspicious mass within the upper-outer left breast. IMPRESSION: No  mammographic evidence for malignancy. RECOMMENDATION: Screening mammogram in one year.(Code:SM-B-01Y) I have discussed the findings and recommendations with the patient. If applicable, a reminder letter will be sent to the patient regarding the next appointment. BI-RADS CATEGORY  1: Negative. Electronically Signed   By: Annia Belt M.D.   On: 09/04/2022 11:28   [place] LIMITED ULTRASOUND INCLUDING AXILLA LEFT BREAST   Result Date: 09/04/2022 CLINICAL DATA:  Recalled for left breast asymmetry. EXAM: DIGITAL DIAGNOSTIC UNILATERAL LEFT MAMMOGRAM WITH TOMOSYNTHESIS; ULTRASOUND LEFT BREAST LIMITED TECHNIQUE: Left digital diagnostic mammography and breast tomosynthesis was performed.; Targeted ultrasound examination of the left breast was performed. COMPARISON:  Previous exam(s). ACR Breast Density Category b: There are scattered areas of  fibroglandular density. FINDINGS: Asymmetry within the upper-outer left breast predominately effaced with additional imaging suggestive of dense fibroglandular tissue. On physical exam, no mass is palpated upper-outer left breast. Targeted ultrasound is performed, showing normal dense tissue without suspicious mass within the upper-outer left breast. IMPRESSION: No mammographic evidence for malignancy. RECOMMENDATION: Screening mammogram in one year.(Code:SM-B-01Y) I have discussed the findings and recommendations with the patient. If applicable, a reminder letter will be sent to the patient regarding the next appointment. BI-RADS CATEGORY  1: Negative. Electronically Signed   By: Annia Belt M.D.   On: 09/04/2022 11:28   Assessment/Plan There are no diagnoses linked to this encounter.   Labs/tests ordered:  * No order type specified * Next appt:  01/01/2023

## 2022-10-05 ENCOUNTER — Telehealth: Payer: Self-pay | Admitting: Student in an Organized Health Care Education/Training Program

## 2022-10-05 ENCOUNTER — Other Ambulatory Visit: Payer: Self-pay | Admitting: *Deleted

## 2022-10-05 ENCOUNTER — Ambulatory Visit: Payer: Medicare Other | Admitting: Student

## 2022-10-05 ENCOUNTER — Other Ambulatory Visit: Payer: Self-pay

## 2022-10-05 DIAGNOSIS — Z79891 Long term (current) use of opiate analgesic: Secondary | ICD-10-CM

## 2022-10-05 DIAGNOSIS — M47816 Spondylosis without myelopathy or radiculopathy, lumbar region: Secondary | ICD-10-CM

## 2022-10-05 DIAGNOSIS — M48062 Spinal stenosis, lumbar region with neurogenic claudication: Secondary | ICD-10-CM

## 2022-10-05 DIAGNOSIS — G894 Chronic pain syndrome: Secondary | ICD-10-CM

## 2022-10-05 MED ORDER — TRAMADOL HCL 50 MG PO TABS
50.0000 mg | ORAL_TABLET | Freq: Two times a day (BID) | ORAL | 0 refills | Status: DC
  Filled 2022-10-05: qty 6, 3d supply, fill #0

## 2022-10-05 NOTE — Telephone Encounter (Signed)
Patient is requesting that you send a prescription for 4 tablets for Tramadol so that she may have enough to last until she gets back from vacation. I suggested that she extend the time between doses so that she will have enough to last. She did not like that.  She does not need Tramadol ER. I will send a request for you to accept or deny.

## 2022-10-05 NOTE — Progress Notes (Unsigned)
Cardiology Office Note  Date:  10/07/2022   ID:  Sheila Newton, DOB November 20, 1940, MRN [id number]  PCP:  Sheila Mealing, MD   Chief Complaint  Patient presents with   New Patient (Initial Visit)    Ref by Sheila Mealing, MD for shortness of breath. Patient c/o dizziness and shortness of breath with activity. Medications reviewed by the patient verbally.     HPI:  [name] is a 82 year old woman with past medical history of Spondylolysis Hyperlipidemia GERD Chronic pain, neuropathy Who presents by referral from Dr. 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  PSH:    Past Surgical History:  Procedure Laterality Date   ABDOMINAL HYSTERECTOMY     back pain     lumbar   RADICAL VAGINAL HYSTERECTOMY  2017   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    Current Outpatient Medications  Medication Sig Dispense Refill   Alpha-Lipoic Acid 600 MG CAPS Take one by mouth once daily.     atorvastatin (LIPITOR) 20 MG tablet Take by mouth daily.     buprenorphine (BUTRANS) 20 MCG/HR PTWK Place 1 patch onto the skin once a week. For chronic pain syndrome. Ok for pt to fill up to 3 days earlier than fill date. 4 patch 2   Calcium Carbonate (CALCIUM 500 PO) Take one by mouth once daily.     Cholecalciferol  (VITAMIN D3) 50 MCG (2000 UT) TABS Take one tablet by mouth daily.     Coenzyme Q10-Vitamin E (QUNOL ULTRA COQ10 PO) Take by mouth daily.     Cyanocobalamin (VITAMIN B 12 PO) Take by mouth daily.     estrogen, conjugated,-medroxyprogesterone (PREMPRO) 0.3-1.5 MG tablet Take 1 tablet by mouth daily. 90 tablet 1   lubiprostone (AMITIZA) 8 MCG capsule Take 8 mcg by mouth 2 (two) times daily as needed for constipation.     Magnesium 200 MG CHEW Chew by mouth daily.     metoprolol tartrate (LOPRESSOR) 50 MG tablet Take 1 tablet (50 mg total) by mouth once 2 hours prior to CTA 1 tablet 0   Multiple Vitamins-Minerals (MULTIVITAMIN ADULT) CHEW Chew by mouth daily.     pantoprazole (PROTONIX) 40 MG tablet Take 1 tablet (40 mg total) by mouth daily. 90 tablet 3   PROLIA 60 MG/ML SOSY injection Inject 60 mg into the skin as needed. Twice a year.     traMADol (ULTRAM) 50 MG tablet Take one tablet by mouth in the morning and take Two and a Half tablet by mouth at bedtime     traMADol (ULTRAM) 50 MG tablet Take 1 tablet (50 mg total) by mouth 2 (two) times daily for 3 days. 50mg  in day and 50mg  at night 6 tablet 0   traMADol (ULTRAM-ER) 100 MG 24 hr  tablet Take 1 tablet (100 mg total) by mouth at bedtime. Ok for pt to fill up to 3 days earlier than fill date. 30 tablet 2   No current facility-administered medications for this visit.     Allergies:   Sumatriptan, Penicillin g, Topiramate, and Ilosone [erythromycin]   Social History:  The patient  reports that she has never smoked. She has never been exposed to tobacco smoke. She has never used smokeless tobacco. She reports that she does not drink alcohol and does not use drugs.   Family History:   family history includes Arthritis/Rheumatoid in her father; Bladder Cancer in her mother; Transient ischemic attack in her father.    Review of Systems: Review of Systems  Constitutional: Negative.   HENT: Negative.    Respiratory:  Positive for shortness of  breath.   Cardiovascular:  Positive for chest pain.  Gastrointestinal: Negative.   Musculoskeletal: Negative.   Neurological:  Positive for dizziness.  Psychiatric/Behavioral: Negative.    All other systems reviewed and are negative.    PHYSICAL EXAM: VS:  BP 138/78 (BP Location: Right Arm, Patient Position: Sitting, Cuff Size: Normal)   Pulse 81   Ht 5\' 3"  (1.6 m)   Wt 120 lb (54.4 kg)   SpO2 94%   BMI 21.26 kg/m  , BMI Body mass index is 21.26 kg/m. GEN: Well nourished, well developed, in no acute distress HEENT: normal Neck: no JVD, carotid bruits, or masses Cardiac: RRR; no murmurs, rubs, or gallops,no edema  Respiratory:  clear to auscultation bilaterally, normal work of breathing GI: soft, nontender, nondistended, + BS MS: no deformity or atrophy Skin: warm and dry, no rash Neuro:  Strength and sensation are intact Psych: euthymic mood, full affect   Recent Labs: 06/08/2022: ALT 14; BUN 22; Creatinine 0.9; Hemoglobin 11.3; Sodium 141    Lipid Panel No results found for: "CHOL", "HDL", "LDLCALC", "TRIG"    Wt Readings from Last 3 Encounters:  10/07/22 120 lb (54.4 kg)  10/02/22 120 lb (54.4 kg)  09/28/22 119 lb (54 kg)       ASSESSMENT AND PLAN:  Problem List Items Addressed This Visit       Cardiology Problems   Hypercholesterolemia (Chronic)   Relevant Medications   metoprolol tartrate (LOPRESSOR) 50 MG tablet   Other Relevant Orders   Basic Metabolic Panel (BMET)     Other   Shortness of breath - Primary   Relevant Orders   EKG 12-Lead   ECHOCARDIOGRAM COMPLETE   CT CORONARY MORPH W/CTA COR W/SCORE W/CA W/CM &/OR WO/CM   Other Visit Diagnoses     Angina pectoris (HCC)       Relevant Medications   metoprolol tartrate (LOPRESSOR) 50 MG tablet   Other Relevant Orders   EKG 12-Lead   ECHOCARDIOGRAM COMPLETE   Basic Metabolic Panel (BMET)   CT CORONARY MORPH W/CTA COR W/SCORE W/CA W/CM &/OR WO/CM   Chest pain, unspecified type        Relevant Orders   CT CORONARY MORPH W/CTA COR W/SCORE W/CA W/CM &/OR WO/CM   Dizziness          Shortness of breath with associated dizziness on exertion Also reports having rare episodes of back pain radiating through to her chest up into her neck and head Etiology of symptoms unclear Echocardiogram ordered to rule out structural heart disease Cardiac CTA ordered to rule out underlying ischemia Blood pressure stable, no indication of orthostasis contributing to her dizziness Less likely cardiac arrhythmia  given reproducible nature of her dizziness     Total encounter time more than 50 minutes  Greater than 50% was spent in counseling and coordination of care with the patient    Signed, Dossie Arbour, M.D., Ph.D. [facility] [place], [place] [phone]

## 2022-10-05 NOTE — Telephone Encounter (Signed)
Patient notified

## 2022-10-05 NOTE — Telephone Encounter (Signed)
Patient is calling about her medications, she will be out of Tramadol 2 days before she gets back from vacation. Wants to know what to do about this. She can't fill until after she comes back from vacations

## 2022-10-06 ENCOUNTER — Other Ambulatory Visit: Payer: Self-pay

## 2022-10-07 ENCOUNTER — Ambulatory Visit: Payer: Medicare Other | Attending: Cardiovascular Disease | Admitting: Cardiovascular Disease

## 2022-10-07 ENCOUNTER — Encounter: Payer: Self-pay | Admitting: Cardiovascular Disease

## 2022-10-07 VITALS — BP 138/78 | HR 81 | Ht 63.0 in | Wt 120.0 lb

## 2022-10-07 DIAGNOSIS — I209 Angina pectoris, unspecified: Secondary | ICD-10-CM

## 2022-10-07 DIAGNOSIS — R42 Dizziness and giddiness: Secondary | ICD-10-CM

## 2022-10-07 DIAGNOSIS — R079 Chest pain, unspecified: Secondary | ICD-10-CM

## 2022-10-07 DIAGNOSIS — R0602 Shortness of breath: Secondary | ICD-10-CM | POA: Diagnosis present

## 2022-10-07 DIAGNOSIS — E7800 Pure hypercholesterolemia, unspecified: Secondary | ICD-10-CM | POA: Diagnosis present

## 2022-10-07 MED ORDER — METOPROLOL TARTRATE 50 MG PO TABS: ORAL_TABLET | ORAL | 0 refills | Status: DC

## 2022-10-07 NOTE — Patient Instructions (Addendum)
Medication Instructions:  No changes  If you need a refill on your cardiac medications before your next appointment, please call your pharmacy.   Lab work: Your physician recommends that you return for lab work within 30 days of CTA: BMP  Medical Mall Entrance at [facility] 1st desk on the right to check in (REGISTRATION)  Lab hours: Monday- Friday (7:30 am- 5:30 pm)   Testing/Procedures: Your physician has requested that you have an echocardiogram. Echocardiography is a painless test that uses sound waves to create images of your heart. It provides your doctor with information about the size and shape of your heart and how well your heart's chambers and valves are working. This procedure takes approximately one hour. There are no restrictions for this procedure. Please do NOT wear cologne, perfume, aftershave, or lotions (deodorant is allowed). Please arrive 15 minutes prior to your appointment time.     Your cardiac CT will be scheduled at one of the below locations:   [facility] [address] [place], [place] [address] [phone]  OR   [facility] [address] [place], [place] [address] [phone]  Please follow these instructions carefully (unless otherwise directed):    On the Night Before the Test: Be sure to Drink plenty of water. Do not consume any caffeinated/decaffeinated beverages or chocolate 12 hours prior to your test. Do not take any antihistamines 12 hours prior to your test.  On the Day of the Test: Drink plenty of water until 1 hour prior to the test. Do not eat any food 1 hour prior to test. You may take your regular medications prior to the test.  Take metoprolol (Lopressor) two hours prior to test. If you take Furosemide/Hydrochlorothiazide/Spironolactone, please HOLD on the morning of the test. FEMALES- please wear underwire-free bra if available, avoid dresses & tight clothing       After the Test: Drink plenty of water. After receiving IV contrast, you may experience a mild flushed feeling. This is normal. On occasion, you may experience a mild rash up to 24 hours after the test. This is not dangerous. If this occurs, you can take Benadryl 25 mg and increase your fluid intake. If you experience trouble breathing, this can be serious. If it is severe call 911 IMMEDIATELY. If it is mild, please call our office. If you take any of these medications: Glipizide/Metformin, Avandament, Glucavance, please do not take 48 hours after completing test unless otherwise instructed.  We will call to schedule your test 2-4 weeks out understanding that some insurance companies will need an authorization prior to the service being performed.   For non-scheduling related questions, please contact the cardiac imaging nurse navigator should you have any questions/concerns: Rockwell Alexandria, Cardiac Imaging Nurse Navigator Larey Brick, Cardiac Imaging Nurse Navigator [facility] Direct Office Dial: [phone]   For scheduling needs, including cancellations and rescheduling, please call [place], [phone].   Follow-Up: At [facility], you and your health needs are our priority.  As part of our continuing mission to provide you with exceptional heart care, we have created designated Provider Care Teams.  These Care Teams include your primary Cardiologist (physician) and Advanced Practice Providers (APPs -  Physician Assistants and Nurse Practitioners) who all work together to provide you with the care you need, when you need it.  You will need a follow up appointment as needed  Providers on your designated Care Team:   Nicolasa Ducking,  NP Eula Listen, PA-C Cadence Fransico Michael, PA-C  COVID-19 Vaccine Information can be found at: [email or web address] For questions related to vaccine distribution or  appointments, please email vaccine@ [email or web address] or call [phone].

## 2022-10-08 ENCOUNTER — Other Ambulatory Visit: Payer: Self-pay

## 2022-10-09 ENCOUNTER — Other Ambulatory Visit: Payer: Self-pay

## 2022-10-09 ENCOUNTER — Telehealth: Payer: Self-pay | Admitting: Student in an Organized Health Care Education/Training Program

## 2022-10-09 NOTE — Telephone Encounter (Signed)
Pharmacy stated that they will need PA to refill patient prescription for patch. Please give pharmacy a call at [phone]

## 2022-10-12 ENCOUNTER — Telehealth: Payer: Self-pay | Admitting: Student in an Organized Health Care Education/Training Program

## 2022-10-12 ENCOUNTER — Other Ambulatory Visit: Payer: Self-pay

## 2022-10-12 NOTE — Telephone Encounter (Signed)
Patient lvmail on Friday 5-17 at 12:04 saying she had a script emergency. She could not pick up scripts for patches. And was due to put one on her back. Also she was going out of town. She wanted someone to call her pharmacy and ok for her to fill scripts.

## 2022-10-12 NOTE — Telephone Encounter (Signed)
Called pharmacy, she has already picked up the script for Tramadol. Need PA for Buprenorphine.

## 2022-10-20 ENCOUNTER — Other Ambulatory Visit: Payer: Self-pay | Admitting: Student in an Organized Health Care Education/Training Program

## 2022-10-20 ENCOUNTER — Other Ambulatory Visit: Payer: Self-pay

## 2022-10-20 ENCOUNTER — Telehealth: Payer: Self-pay | Admitting: Pain Medicine

## 2022-10-20 DIAGNOSIS — G894 Chronic pain syndrome: Secondary | ICD-10-CM

## 2022-10-20 DIAGNOSIS — M48062 Spinal stenosis, lumbar region with neurogenic claudication: Secondary | ICD-10-CM

## 2022-10-20 DIAGNOSIS — Z79891 Long term (current) use of opiate analgesic: Secondary | ICD-10-CM

## 2022-10-20 DIAGNOSIS — M47816 Spondylosis without myelopathy or radiculopathy, lumbar region: Secondary | ICD-10-CM

## 2022-10-20 NOTE — Telephone Encounter (Signed)
PT stated that she had spoke with Chip Boer early today. PT stated that she called pharmacy and was told that she didn't have any more refill on her Tramadol prescription. PT stated that it's incorrect, she should have a prescription to be pick up. PT asked if Chip Boer can fix this problem today. Please give patient a call. TY

## 2022-10-21 ENCOUNTER — Other Ambulatory Visit: Payer: Self-pay

## 2022-10-21 ENCOUNTER — Other Ambulatory Visit: Payer: Self-pay | Admitting: Student in an Organized Health Care Education/Training Program

## 2022-10-21 NOTE — Telephone Encounter (Signed)
The pharmacy cannot fill her tramadol 50 mg. They need a new prescription. Please call patient with update

## 2022-10-22 ENCOUNTER — Other Ambulatory Visit: Payer: Self-pay

## 2022-10-22 MED ORDER — TRAMADOL HCL 50 MG PO TABS
50.0000 mg | ORAL_TABLET | Freq: Two times a day (BID) | ORAL | 2 refills | Status: DC | PRN
  Filled 2022-10-22: qty 30, 15d supply, fill #0

## 2022-10-28 ENCOUNTER — Other Ambulatory Visit: Payer: Self-pay

## 2022-10-29 ENCOUNTER — Other Ambulatory Visit: Payer: Self-pay

## 2022-10-29 ENCOUNTER — Encounter: Payer: Self-pay | Admitting: Student in an Organized Health Care Education/Training Program

## 2022-10-29 ENCOUNTER — Ambulatory Visit
Payer: Medicare Other | Attending: Student in an Organized Health Care Education/Training Program | Admitting: Student in an Organized Health Care Education/Training Program

## 2022-10-29 VITALS — BP 153/101 | HR 106 | Temp 99.0°F | Resp 18 | Ht 63.0 in | Wt 120.0 lb

## 2022-10-29 DIAGNOSIS — M5416 Radiculopathy, lumbar region: Secondary | ICD-10-CM | POA: Insufficient documentation

## 2022-10-29 DIAGNOSIS — G8929 Other chronic pain: Secondary | ICD-10-CM | POA: Insufficient documentation

## 2022-10-29 DIAGNOSIS — G894 Chronic pain syndrome: Secondary | ICD-10-CM | POA: Diagnosis not present

## 2022-10-29 DIAGNOSIS — M48061 Spinal stenosis, lumbar region without neurogenic claudication: Secondary | ICD-10-CM | POA: Diagnosis not present

## 2022-10-29 DIAGNOSIS — Z79891 Long term (current) use of opiate analgesic: Secondary | ICD-10-CM | POA: Diagnosis present

## 2022-10-29 DIAGNOSIS — M47816 Spondylosis without myelopathy or radiculopathy, lumbar region: Secondary | ICD-10-CM | POA: Diagnosis present

## 2022-10-29 DIAGNOSIS — M48062 Spinal stenosis, lumbar region with neurogenic claudication: Secondary | ICD-10-CM | POA: Diagnosis not present

## 2022-10-29 MED ORDER — TRAMADOL HCL 50 MG PO TABS
50.0000 mg | ORAL_TABLET | Freq: Two times a day (BID) | ORAL | 2 refills | Status: DC | PRN
  Filled 2022-10-29 – 2022-11-04 (×2): qty 60, 30d supply, fill #0
  Filled 2022-12-07: qty 60, 30d supply, fill #1
  Filled 2023-01-05: qty 60, 30d supply, fill #2

## 2022-10-29 MED ORDER — BUPRENORPHINE 20 MCG/HR TD PTWK
1.0000 | MEDICATED_PATCH | TRANSDERMAL | 2 refills | Status: DC
Start: 2022-11-05 — End: 2023-01-19
  Filled 2022-10-29 – 2022-11-09 (×11): qty 4, 28d supply, fill #0
  Filled 2022-12-07: qty 4, 28d supply, fill #1
  Filled 2023-01-05: qty 4, 28d supply, fill #2

## 2022-10-29 MED ORDER — TRAMADOL HCL ER 100 MG PO TB24
100.0000 mg | ORAL_TABLET | Freq: Every day | ORAL | 2 refills | Status: DC
Start: 2022-11-19 — End: 2023-01-19
  Filled 2022-10-29 – 2022-11-16 (×2): qty 30, 30d supply, fill #0
  Filled 2022-12-21: qty 30, 30d supply, fill #1

## 2022-10-29 NOTE — Patient Instructions (Signed)

## 2022-10-29 NOTE — Progress Notes (Signed)
PROVIDER NOTE: Information contained herein reflects review and annotations entered in association with encounter. Interpretation of such information and data should be left to medically-trained personnel. Information provided to patient can be located elsewhere in the medical record under "Patient Instructions". Document created using STT-dictation technology, any transcriptional errors that may result from process are unintentional.    Patient: Sheila Newton  Service Category: E/M  Provider: Edward Jolly, MD  DOB: Aug 26, 1940  DOS: 10/29/2022  Specialty: Interventional Pain Management  MRN: [id number]  Setting: Ambulatory outpatient  PCP: [name], MD  Type: Established Patient    Referring Provider: [name], MD  Location: Office  Delivery: Face-to-face     HPI  [name], a 82 y.o. year old female, has Chronic radicular lumbar pain; Lumbar spondylosis; Lumbar degenerative disc disease; Pudendal neuralgia; Encounter for long-term opiate analgesic use; Spinal stenosis, lumbar region, with neurogenic claudication; Chronic pain syndrome; Small fiber neuropathy; Piriformis syndrome of both sides; Neuroforaminal stenosis of lumbar spine (L5/S1, severe bilateral); Pain management contract signed; Sacroiliac joint pain; SI joint arthritis; Allergic rhinitis; Callus of foot; Compression fracture of T12 vertebra (HCC); GERD without esophagitis; Great toe pain, right; Hypercholesterolemia; Rosacea; Shortness of breath; Chondrodermatitis nodularis helicis; Hot flashes; Drug-induced constipation; Anemia; Dry mouth; Presbycusis; and Dry eyes on their problem list.,is here today because of her Neuroforaminal stenosis of lumbar spine [M48.061]. Sheila Newton primary complain today is Sciatica (Left>right)  Last encounter: My last encounter with her was on 08/04/22 Pain Assessment: Severity of Chronic pain is reported as a 9 /10. Location:  (sciatic) Right, Left/legs. laterally and posteriorly. Onset: More  than a month ago. Quality: Shooting, Throbbing, Aching. Timing: Intermittent. Modifying factor(s): medications, NSAIDS, positioning, heat. Vitals:  height is 5\' 3"  (1.6 m) and weight is 120 lb (54.4 kg). Her temporal temperature is 99 F (37.2 C). Her blood pressure is 153/101 (abnormal) and her pulse is 106 (abnormal). Her respiration is 18 and oxygen saturation is 98%.   Reason for encounter: medication management.  She is also having low back pain with radiation into left leg.  And presents today for medication management of her Butrans patch, tramadol immediate release and tramadol extended release.  Unfortunately, another clinic deleted her tramadol prescriptions and she had some trouble receiving them in a timely manner at her pharmacy especially since she was out of town.  That has now been resolved.  She is working with physical therapy but is having a lumbar radicular pain flare.  She states that her left leg is painful and she is having trouble walking.  She has had epidural steroid injections in the past, her previous one was over 2 years ago.  She would like to repeat as they did help reduce her pain by about 60 to 70%.   HPI from initial clinic note 11/26/20: Mease is a very pleasant 82 year old female with a history of lumbar facet arthropathy, lumbar spondylosis, pudendal neuralgia, small fiber neuropathy, spinal stenosis with neurogenic claudication, piriformis syndrome who presents for chronic pain management.  She was previously established with pain management in [facility].  She moved to [place] approximately 2 years ago.  She has been seeing Dr. Marca Ancona at [facility].  She would like to transfer her pain management here as this is closer to her endograft [place] every 2 months is too long.  She is currently managed on Butrans patch 20 mcg an hour, tramadol 50 mg twice daily as needed for breakthrough pain, tramadol ER 100 mg nightly.  She has had multiple  interventional procedures including lumbar epidural steroid injections, sacral lateral branch nerve blocks, as well as pudendal nerve blocks with limited response.  She has read about spinal cord stimulation but states that she is not ready to pursue this yet.  She would like to have her Butrans patch, tramadol continued at this clinic.  No issues with medication compliance or intake, or overutilization or diversion.  Pharmacotherapy Assessment  Analgesic: Butrans patch 20 mcg an hour, tramadol 50 mg twice daily as needed for breakthrough pain, tramadol ER 100 mg nightly   Monitoring: [place] PMP: PDMP reviewed during this encounter.       Pharmacotherapy: No side-effects or adverse reactions reported. Compliance: No problems identified. Effectiveness: Clinically acceptable.  Rickey Barbara, RN  10/29/2022  1:45 PM  Signed Nursing Pain Medication Assessment:  On Sheila Newton regular appointment, she did not comply with our medication refill policy of bringing her pills and bottle(s) to be examined and counted. As a consequence, her prescriptions were written and held, until she could bring back the medications to be examined. She comes in now to comply with this requirement.  Medication #1: Buprenorphine (Suboxone) Pill/Patch Count:  1 of 4 patches remain Bottle Appearance: Standard pharmacy container. Clearly labeled. Filled Date: 05 / 17 / 2023 Last Medication intake:   10/23/2022  Medication #2: Tramadol (Ultram) Pill/Patch Count:  8 of 30 pills remain Bottle Appearance: Standard pharmacy container. Clearly labeled. Filled Date: 05 / 30 / 2024 Last Medication intake:  Today  Medication #3: Tramadol (Ultram) ER Pill/Patch Count:  22 of 30 pills remain Bottle Appearance: Standard pharmacy container. Clearly labeled. Filled Date: 05 / 28 / 2024 Last Medication intake:  Yesterday  Prescriptions: Written prescriptions to last for  .  given to patient once pill count and bottle inspection  was completed.  Date: 10/29/22; Time: 1:39 PM      UDS:  Summary  Date Value Ref Range Status  05/12/2022 Note  Final    Comment:    ==================================================================== [employer] Select 13 (MW) ==================================================================== Test                             Result       Flag       Units  Drug Present and Declared for Prescription Verification   Buprenorphine                  35           EXPECTED   ng/mg creat   Norbuprenorphine               40           EXPECTED   ng/mg creat    Source of buprenorphine is a scheduled prescription medication.    Norbuprenorphine is an expected metabolite of buprenorphine.    Tramadol                       >3597        EXPECTED   ng/mg creat   O-Desmethyltramadol            >3597        EXPECTED   ng/mg creat   N-Desmethyltramadol            >3597        EXPECTED   ng/mg creat    Source of tramadol is a prescription medication.  O-desmethyltramadol    and N-desmethyltramadol are expected metabolites of tramadol.  ==================================================================== Test                      Result    Flag   Units      Ref Range   Creatinine              139              mg/dL      >=47 ==================================================================== Declared Medications:  The flagging and interpretation on this report are based on the  following declared medications.  Unexpected results may arise from  inaccuracies in the declared medications.   **Note: The testing scope of this panel includes these medications:   Tramadol (Ultram)   **Note: The testing scope of this panel does not include small to  moderate amounts of these reported medications:   Buprenorphine Patch (BuTrans)   **Note: The testing scope of this panel does not include the  following reported medications:   Aspirin  Atorvastatin  Cyanocobalamin  Denosumab (Prolia)  Estradiol   Fluoride (Prevident)  Lubiprostone (Amitiza)  Magnesium  Multivitamin  Pantoprazole (Protonix)  Ubiquinone (CoQ10)  Vitamin E ==================================================================== For clinical consultation, please call [phone]. ====================================================================      ROS  Constitutional: Denies any fever or chills Gastrointestinal: No reported hemesis, hematochezia, vomiting, or acute GI distress Musculoskeletal:  Low back pain with radiation into left leg Neurological:  LE neuropathic pain  Medication Review  Alpha-Lipoic Acid, Calcium Carbonate, Coenzyme Q10-Vitamin E, Cyanocobalamin, Magnesium, Multivitamin Adult, Vitamin D3, atorvastatin, buprenorphine, denosumab, estrogen (conjugated)-medroxyprogesterone, lubiprostone, metoprolol tartrate, pantoprazole, and traMADol  History Review  Allergy: Ms. Spinnato is allergic to sumatriptan, penicillin g, topiramate, and ilosone [erythromycin]. Drug: Ms. Pullis  reports no history of drug use. Alcohol:  reports no history of alcohol use. Tobacco:  reports that she has never smoked. She has never been exposed to tobacco smoke. She has never used smokeless tobacco. Social: Ms. Mound  reports that she has never smoked. She has never been exposed to tobacco smoke. She has never used smokeless tobacco. She reports that she does not drink alcohol and does not use drugs. Medical:  has a past medical history of Small fiber polyneuropathy and Spondylolysis. Surgical: Ms. Coonrod  has a past surgical history that includes back pain; Abdominal hysterectomy; Zenker's diverticulectomy (Left, 07/17/2022); and Radical vaginal hysterectomy (2017). Family: family history includes Arthritis/Rheumatoid in her father; Bladder Cancer in her mother; Transient ischemic attack in her father.  Narrative & Impression  CLINICAL DATA:  Lower pelvis and groin pain for 5-7 years   EXAM: MRI LUMBAR SPINE WITHOUT  CONTRAST   TECHNIQUE: Multiplanar, multisequence MR imaging of the lumbar spine was performed. No intravenous contrast was administered.   COMPARISON:  None.   FINDINGS: Segmentation: There are 5 non-rib bearing lumbar type vertebral bodies with the last intervertebral disc space labeled as L5-S1.   Alignment: There is a grade 1 anterolisthesis of L4 on L5 measuring approximately 3 mm. There is also a grade 1 anterolisthesis of L5 on S1 measuring 6 mm.   Vertebrae: There is a chronic anterior wedge compression deformity of the T12 vertebral body with approximately 50% loss in vertebral body height. No retropulsion of fragments is seen. There is buckling of the superior cortex of T12. 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 Endplate STIR signal changes are seen at L5-S1. No acute fracture, marrow edema,or pathologic marrow infiltration.  Conus medullaris and cauda equina: Conus extends to the L1-2 level. Conus and cauda equina appear normal.   Paraspinal and other soft tissues: The paraspinal soft tissues and visualized retroperitoneal structures are unremarkable. The sacroiliac joints are intact.   Disc levels:   T12-L1: There is a broad-based disc bulge with facet arthrosis which causes mild bilateral neural foraminal narrowing.   L1-L2: There is a broad-based disc bulge with facet arthrosis which causes mild bilateral neural foraminal narrowing.   L2-L3: There is a broad-based disc bulge with facet arthrosis which causes mild-to-moderate bilateral neural foraminal narrowing. There is mild effacement anterior thecal sac.   L3-L4: There is a broad-based disc bulge with ligamentum flavum hypertrophy and facet arthrosis which causes moderate bilateral neural foraminal narrowing. There is mild central canal narrowing.   L4-L5: There is a broad-based disc bulge with a central disc protrusion, facet arthrosis and ligamentum flavum hypertrophy. There is severe  bilateral neural foraminal narrowing. The central thecal sac is effaced measuring 4 mm in AP diameter.   L5-S1: There is disc uncovering with a broad-based disc bulge and facet arthrosis. Severe bilateral neural foraminal narrowing is seen. There is mild central canal stenosis.   IMPRESSION: 1. Grade 1 anterolisthesis of L4 on L5 and L5 on S1. 2. Findings suggestive of chronic bilateral pars defects at L5-S1 with Modic type endplate changes. 3. Lumbar spine spondylosis most notable at L4-L5 with severe bilateral neural foraminal narrowing and moderate-severe central canal stenosis and at L5-S1 with severe bilateral neural foraminal narrowing and mild central canal stenosis.     Electronically Signed   By: Jonna Clark M.D.   On: 05/04/2019 13:52    Physical Exam  General appearance: Well nourished, well developed, and well hydrated. In no apparent acute distress Mental status: Alert, oriented x 3 (person, place, & time)       Respiratory: No evidence of acute respiratory distress Eyes: PERLA Vitals: BP (!) 153/101   Pulse (!) 106   Temp 99 F (37.2 C) (Temporal)   Resp 18   Ht 5\' 3"  (1.6 m)   Wt 120 lb (54.4 kg)   SpO2 98%   BMI 21.26 kg/m  BMI: Estimated body mass index is 21.26 kg/m as calculated from the following:   Height as of this encounter: 5\' 3"  (1.6 m).   Weight as of this encounter: 120 lb (54.4 kg). Ideal: Ideal body weight: 52.4 kg (115 lb 8.3 oz) Adjusted ideal body weight: 53.2 kg (117 lb 5 oz)      Lumbar Spine Area Exam  Skin & Axial Inspection: No masses, redness, or swelling Alignment: Symmetrical Functional ROM: Pain restricted ROM       Stability: No instability detected Muscle Tone/Strength: Functionally intact. No obvious neuro-muscular anomalies detected. Sensory (Neurological): Dermatomal pain pattern left L4-L5 Positive straight leg raise test on the left  Gait & Posture Assessment  Ambulation: Unassisted Gait: Relatively normal for  age and body habitus Posture: WNL  Lower Extremity Exam      Side: Right lower extremity   Side: Left lower extremity  Stability: No instability observed           Stability: No instability observed          Skin & Extremity Inspection: Skin color, temperature, and hair growth are WNL. No peripheral edema or cyanosis. No masses, redness, swelling, asymmetry, or associated skin lesions. No contractures.   Skin & Extremity Inspection: Skin color, temperature, and hair growth are WNL. No peripheral  edema or cyanosis. No masses, redness, swelling, asymmetry, or associated skin lesions. No contractures.  Functional ROM: Unrestricted ROM                   Functional ROM: Pain restricted range of motion                Muscle Tone/Strength: Functionally intact. No obvious neuro-muscular anomalies detected.   Muscle Tone/Strength: Functionally intact. No obvious neuro-muscular anomalies detected.  Sensory (Neurological): Neurogenic pain pattern         Sensory (Neurological): Neurogenic pain pattern        DTR: Patellar: deferred today Achilles: deferred today Plantar: deferred today   DTR: Patellar: deferred today Achilles: deferred today Plantar: deferred today  Palpation: No palpable anomalies   Palpation: No palpable anomalies    Assessment   Status Diagnosis  Persistent Having a Flare-up Controlled 1. Neuroforaminal stenosis of lumbar spine (L5/S1, severe bilateral)   2. Chronic radicular lumbar pain   3. Encounter for long-term opiate analgesic use   4. Spinal stenosis, lumbar region, with neurogenic claudication   5. Chronic pain syndrome   6. Lumbar spondylosis (severe, L4/5)       Plan of Care    [name] has a current medication list which includes the following long-term medication(s): atorvastatin, calcium carbonate, prempro, metoprolol tartrate, and pantoprazole.  Pharmacotherapy (Medications Ordered): Meds ordered this encounter  Medications   buprenorphine  (BUTRANS) 20 MCG/HR PTWK    Sig: Place 1 patch onto the skin once a week. For chronic pain syndrome. Ok for pt to fill up to 3 days earlier than fill date.    Dispense:  4 patch    Refill:  2   traMADol (ULTRAM) 50 MG tablet    Sig: Take 1 tablet (50 mg total) by mouth 2 (two) times daily as needed for severe pain. Ok to pick up 2-3 days prior to fill date    Dispense:  60 tablet    Refill:  2   traMADol (ULTRAM-ER) 100 MG 24 hr tablet    Sig: Take 1 tablet (100 mg total) by mouth at bedtime. Ok for pt to fill up to 3 days earlier than fill date.    Dispense:  30 tablet    Refill:  2   Discussed increased water intake and increasing Magnesium to 600 mg daily to help with muscle cramps  Orders Placed This Encounter  Procedures   Lumbar Epidural Injection    Standing Status:   Future    Standing Expiration Date:   01/29/2023    Scheduling Instructions:     Procedure: Interlaminar Lumbar Epidural Steroid injection (LESI)            Laterality: Midline- Left L4/5 or L5/S1     Sedation: local     Timeframe: ASAA    Order Specific Question:   Where will this procedure be performed?    Answer:   [facility]     Follow-up plan:   Return in about 25 days (around 11/23/2022) for L4/5 (or 5/1) ESI, in clinic [facility].     Bilateral SIJ/ piriformis 01/15/21     Recent Visits Date Type Provider Dept  08/04/22 Office Visit Edward Jolly, MD [facility]  Showing recent visits within past 90 days and meeting all other requirements Today's Visits Date Type Provider Dept  10/29/22 Office Visit Edward Jolly, MD [facility]  Showing today's visits and meeting all other requirements Future  Appointments Date Type Provider Dept  11/23/22 Appointment Edward Jolly, MD [facility]  01/26/23 Appointment Edward Jolly, MD [facility]  Showing future appointments within next 90 days and meeting all other requirements  I discussed the assessment and  treatment plan with the patient. The patient was provided an opportunity to ask questions and all were answered. The patient agreed with the plan and demonstrated an understanding of the instructions.  Patient advised to call back or seek an in-person evaluation if the symptoms or condition worsens.  Duration of encounter: .  Note by: Edward Jolly, MD Date: 10/29/2022; Time: 2:34 PM

## 2022-10-29 NOTE — Progress Notes (Signed)
Nursing Pain Medication Assessment:  On Sheila Newton regular appointment, she did not comply with our medication refill policy of bringing her pills and bottle(s) to be examined and counted. As a consequence, her prescriptions were written and held, until she could bring back the medications to be examined. She comes in now to comply with this requirement.  Medication #1: Buprenorphine (Suboxone) Pill/Patch Count:  1 of 4 patches remain Bottle Appearance: Standard pharmacy container. Clearly labeled. Filled Date: 05 / 17 / 2023 Last Medication intake:   10/23/2022  Medication #2: Tramadol (Ultram) Pill/Patch Count:  8 of 30 pills remain Bottle Appearance: Standard pharmacy container. Clearly labeled. Filled Date: 05 / 30 / 2024 Last Medication intake:  Today  Medication #3: Tramadol (Ultram) ER Pill/Patch Count:  22 of 30 pills remain Bottle Appearance: Standard pharmacy container. Clearly labeled. Filled Date: 05 / 28 / 2024 Last Medication intake:  Yesterday  Prescriptions: Written prescriptions to last for  .  given to patient once pill count and bottle inspection was completed.  Date: 10/29/22; Time: 1:39 PM

## 2022-11-02 ENCOUNTER — Other Ambulatory Visit: Payer: Self-pay

## 2022-11-02 ENCOUNTER — Encounter: Payer: Self-pay | Admitting: Student

## 2022-11-03 ENCOUNTER — Encounter: Payer: Medicare Other | Admitting: Student in an Organized Health Care Education/Training Program

## 2022-11-03 ENCOUNTER — Other Ambulatory Visit: Payer: Self-pay

## 2022-11-04 ENCOUNTER — Telehealth: Payer: Self-pay | Admitting: Student in an Organized Health Care Education/Training Program

## 2022-11-04 ENCOUNTER — Other Ambulatory Visit: Payer: Self-pay

## 2022-11-04 NOTE — Telephone Encounter (Signed)
PA Sent 11/04/22 LP

## 2022-11-04 NOTE — Telephone Encounter (Signed)
PT stated that pharmacy need a PA so patient can get Butrans filled.Please give patient a call once it's been send in. TY

## 2022-11-06 ENCOUNTER — Other Ambulatory Visit: Payer: Self-pay

## 2022-11-06 ENCOUNTER — Telehealth: Payer: Self-pay | Admitting: Student in an Organized Health Care Education/Training Program

## 2022-11-06 NOTE — Telephone Encounter (Signed)
PT called stated that the pharmacy is still waiting on PA to filled her patches prescription. PT stated that she is out and need something to be done. PT stated that she is out of patches and will have to keep the same patch on all weekend. Please give patient a call. TY

## 2022-11-09 ENCOUNTER — Telehealth: Payer: Self-pay | Admitting: Student in an Organized Health Care Education/Training Program

## 2022-11-09 ENCOUNTER — Other Ambulatory Visit: Payer: Self-pay

## 2022-11-09 NOTE — Telephone Encounter (Signed)
Patient is calling about PA for pain patches. Wants to know where this stands ? Also wants Sheila Newton notified of the fact that she has not been able to get her medications. Wants a call back asap

## 2022-11-09 NOTE — Telephone Encounter (Signed)
Appeal done with insurance company for [employer] name.  Case number given to [facility].

## 2022-11-09 NOTE — Telephone Encounter (Signed)
Patient spoken to by Earlie Lou RN

## 2022-11-10 ENCOUNTER — Ambulatory Visit (INDEPENDENT_AMBULATORY_CARE_PROVIDER_SITE_OTHER): Payer: Medicare Other | Admitting: Nurse Practitioner

## 2022-11-10 ENCOUNTER — Other Ambulatory Visit: Payer: Self-pay

## 2022-11-10 ENCOUNTER — Encounter: Payer: Self-pay | Admitting: Nurse Practitioner

## 2022-11-10 VITALS — BP 136/82 | HR 87 | Temp 96.7°F | Ht 63.0 in | Wt 120.0 lb

## 2022-11-10 DIAGNOSIS — Z0000 Encounter for general adult medical examination without abnormal findings: Secondary | ICD-10-CM | POA: Diagnosis not present

## 2022-11-10 NOTE — Patient Instructions (Addendum)
  Sheila Newton , Thank you for taking time to come for your Medicare Wellness Visit. I appreciate your ongoing commitment to your health goals. Please review the following plan we discussed and let me know if I can assist you in the future.   Please see if you can locate the dates of your shingles vaccines [facility])   These are the goals we discussed:  Goals      Increase physical activity        This is a list of the screening recommended for you and due dates:  Health Maintenance  Topic Date Due   Zoster (Shingles) Vaccine (1 of 2) Never done   COVID-19 Vaccine (5 - 2023-24 season) 10/19/2025*   Flu Shot  12/24/2022   Medicare Annual Wellness Visit  11/10/2023   DTaP/Tdap/Td vaccine (2 - Td or Tdap) 12/20/2028   Pneumonia Vaccine  Completed   DEXA scan (bone density measurement)  Completed   HPV Vaccine  Aged Out  *Topic was postponed. The date shown is not the original due date.

## 2022-11-10 NOTE — Progress Notes (Signed)
Subjective:   Sheila Newton is a 82 y.o. female who presents for Medicare Annual (Subsequent) preventive examination.   Review of Systems           Objective:    Today's Vitals   11/10/22 0936 11/10/22 0956  BP: 136/82   Pulse: 87   Temp: (!) 96.7 F (35.9 C)   SpO2: 98%   Weight: 120 lb (54.4 kg)   Height: 5\' 3"  (1.6 m)   PainSc:  3    Body mass index is 21.26 kg/m.     11/10/2022    9:43 AM 10/29/2022    1:34 PM 10/02/2022    1:35 PM 09/28/2022    1:06 PM 09/02/2022    1:05 PM 05/12/2022    1:31 PM 02/12/2022    1:45 PM  Advanced Directives  Does Patient Have a Medical Advance Directive? Yes Yes Yes Yes Yes Yes Yes  Type of Estate agent of [place];Living will Healthcare Power of [place];Living will Healthcare Power of [place];Living will Healthcare Power of [place];Living will Healthcare Power of [place];Living will Healthcare Power of [place];Living will Healthcare Power of [place];Living will  Does patient want to make changes to medical advance directive? No - Patient declined Yes (MAU/Ambulatory/Procedural Areas - Information given) No - Patient declined No - Patient declined No - Patient declined    Copy of Healthcare Power of Attorney in Chart? No - copy requested No - copy requested Yes - validated most recent copy scanned in chart (See row information) No - copy requested No - copy requested      Current Medications (verified) Outpatient Encounter Medications as of 11/10/2022  Medication Sig   Alpha-Lipoic Acid 600 MG CAPS Take one by mouth once daily.   atorvastatin (LIPITOR) 20 MG tablet Take by mouth daily.   buprenorphine (BUTRANS) 20 MCG/HR PTWK Place 1 patch onto the skin once a week. For chronic pain syndrome. Ok for pt to fill up to 3 days earlier than fill date.   Calcium Carbonate (CALCIUM 500 PO) Take one by mouth once daily.   Cholecalciferol (VITAMIN D3) 50 MCG (2000 UT) TABS Take one tablet by mouth daily.   Coenzyme  Q10-Vitamin E (QUNOL ULTRA COQ10 PO) Take by mouth daily.   Cyanocobalamin (VITAMIN B 12 PO) Take by mouth daily.   estrogen, conjugated,-medroxyprogesterone (PREMPRO) 0.3-1.5 MG tablet Take 1 tablet by mouth daily.   lubiprostone (AMITIZA) 8 MCG capsule Take 8 mcg by mouth 2 (two) times daily as needed for constipation.   Magnesium 200 MG CHEW Chew by mouth daily.   metoprolol tartrate (LOPRESSOR) 50 MG tablet Take 1 tablet (50 mg total) by mouth once 2 hours prior to CTA   Multiple Vitamins-Minerals (MULTIVITAMIN ADULT) CHEW Chew by mouth daily.   pantoprazole (PROTONIX) 40 MG tablet Take 1 tablet (40 mg total) by mouth daily.   PROLIA 60 MG/ML SOSY injection Inject 60 mg into the skin as needed. Twice a year.   traMADol (ULTRAM) 50 MG tablet Take 1 tablet (50 mg total) by mouth 2 (two) times daily as needed for severe pain. Ok to pick up 2-3 days prior to fill date   [START ON 11/19/2022] traMADol (ULTRAM-ER) 100 MG 24 hr tablet Take 1 tablet (100 mg total) by mouth at bedtime. Ok for pt to fill up to 3 days earlier than fill date.   No facility-administered encounter medications on file as of 11/10/2022.    Allergies (verified) Sumatriptan, Penicillin g, Topiramate, and Ilosone [erythromycin]  History: Past Medical History:  Diagnosis Date   Small fiber polyneuropathy    Spondylolysis    Past Surgical History:  Procedure Laterality Date   ABDOMINAL HYSTERECTOMY     back pain     lumbar   RADICAL VAGINAL HYSTERECTOMY  2017   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   Family History  Problem Relation Age of Onset   Bladder Cancer Mother    Transient ischemic attack Father    Arthritis/Rheumatoid Father    Breast cancer Neg Hx    Social History   Socioeconomic History   Marital status: Married    Spouse name: Not on file   Number of children: Not on file   Years of education: Not on file   Highest education level: Bachelor's degree (e.g., BA, AB, BS)  Occupational  History   Not on file  Tobacco Use   Smoking status: Never    Passive exposure: Never   Smokeless tobacco: Never  Vaping Use   Vaping Use: Never used  Substance and Sexual Activity   Alcohol use: Never   Drug use: Never   Sexual activity: Not on file  Other Topics Concern   Not on file  Social History Narrative   Not on file   Social Determinants of Health   Financial Resource Strain: Low Risk  (09/28/2022)   Overall Financial Resource Strain (CARDIA)    Difficulty of Paying Living Expenses: Not hard at all  Food Insecurity: No Food Insecurity (09/28/2022)   Hunger Vital Sign    Worried About Running Out of Food in the Last Year: Never true    Ran Out of Food in the Last Year: Never true  Transportation Needs: No Transportation Needs (09/28/2022)   PRAPARE - Administrator, Civil Service (Medical): No    Lack of Transportation (Non-Medical): No  Physical Activity: Insufficiently Active (09/28/2022)   Exercise Vital Sign    Days of Exercise per Week: 3 days    Minutes of Exercise per Session: 30 min  Stress: No Stress Concern Present (09/28/2022)   [employer] of Occupational Health - Occupational Stress Questionnaire    Feeling of Stress : Not at all  Social Connections: Socially Integrated (09/28/2022)   Social Connection and Isolation Panel [NHANES]    Frequency of Communication with Friends and Family: More than three times a week    Frequency of Social Gatherings with Friends and Family: Twice a week    Attends Religious Services: 1 to 4 times per year    Active Member of [employer] or Organizations: Yes    Attends Engineer, structural: Not on file    Marital Status: Married    Tobacco Counseling Counseling given: Not Answered   Clinical Intake:  Pre-visit preparation completed: Yes  Pain Score: 3  Pain Type: Neuropathic pain Pain Location: Other (Comment) (scaitic pain on left) Pain Orientation: Left Pain Descriptors / Indicators: Sharp,  Shooting Pain Onset: More than a month ago Pain Frequency: Intermittent     BMI - recorded: 21.26 Nutritional Status: BMI of 19-24  Normal Nutritional Risks: None Diabetes: No  How often do you need to have someone help you when you read instructions, pamphlets, or other written materials from your doctor or pharmacy?: 1 - Never What is the last grade level you completed in school?: 4 years of college plus another year of college.  Interpreter Needed?: No      Activities of Daily Living    11/10/2022  9:40 AM  In your present state of health, do you have any difficulty performing the following activities:  Hearing? 1  Vision? 0  Difficulty concentrating or making decisions? 0  Walking or climbing stairs? 1  Dressing or bathing? 0  Doing errands, shopping? 0  Preparing Food and eating ? N  Using the Toilet? N  In the past six months, have you accidently leaked urine? N  Do you have problems with loss of bowel control? N  Managing your Medications? N  Managing your Finances? N  Housekeeping or managing your Housekeeping? N    Patient Care Team: [name], MD as PCP - General (Family Medicine)  Indicate any recent Medical Services you may have received from other than [facility] providers in the past year (date may be approximate).     Assessment:   This is a routine wellness examination for Sanayah.  Hearing/Vision screen Vision Screening - Comments:: [facility]- 6-8 months ago last visit.   Dietary issues and exercise activities discussed:     Goals Addressed             This Visit's Progress    Increase physical activity         Depression Screen    11/10/2022    9:44 AM 10/29/2022    1:34 PM 09/02/2022    1:06 PM 05/12/2022    1:31 PM 02/12/2022    1:45 PM 03/18/2021   10:33 AM 01/15/2021   11:42 AM  PHQ 2/9 Scores  PHQ - 2 Score 0 0 0 0 0 0 0    Fall Risk    11/10/2022    9:44 AM 10/29/2022    1:34 PM 09/02/2022    1:05 PM 08/04/2022     1:55 PM 05/12/2022    1:31 PM  Fall Risk   Falls in the past year? 0 0 0 0 0  Number falls in past yr: 0  0    Injury with Fall? 0  0      MEDICARE RISK AT HOME:   TIMED UP AND GO:  Was the test performed?  No    Cognitive Function:        11/10/2022    9:44 AM  6CIT Screen  What Year? 0 points  What month? 0 points  What time? 0 points  Count back from 20 0 points  Months in reverse 0 points  Repeat phrase 0 points  Total Score 0 points    Immunizations Immunization History  Administered Date(s) Administered   Influenza-Unspecified 03/27/2019, 03/25/2020   Moderna SARS-COV2 Booster Vaccination 03/01/2021, 10/21/2021   Moderna Sars-Covid-2 Vaccination 06/08/2019, 07/06/2019, 04/09/2020, 10/13/2020   Pneumococcal Conjugate,unspecified 03/08/2001, 04/23/2006   Pneumococcal Conjugate-13 02/07/2013   Pneumococcal Polysaccharide-23 04/23/2006   RSV,unspecified 04/29/2022   Tdap 12/21/2018   Zoster, Live 09/02/2005, 08/06/2017    TDAP status: Up to date  Flu Vaccine status: Up to date  Pneumococcal vaccine status: Up to date  Covid-19 vaccine status: Information provided on how to obtain vaccines.   Qualifies for Shingles Vaccine? Yes   Zostavax completed No   Shingrix Completed?: No.    Education has been provided regarding the importance of this vaccine. Patient has been advised to call insurance company to determine out of pocket expense if they have not yet received this vaccine. Advised may also receive vaccine at local pharmacy or Health Dept. Verbalized acceptance and understanding.  Screening Tests Health Maintenance  Topic Date Due   Zoster Vaccines- Shingrix (  1 of 2) Never done   COVID-19 Vaccine (5 - 2023-24 season) 10/19/2025 (Originally 01/23/2022)   INFLUENZA VACCINE  12/24/2022   Medicare Annual Wellness (AWV)  11/10/2023   DTaP/Tdap/Td (2 - Td or Tdap) 12/20/2028   Pneumonia Vaccine 12+ Years old  Completed   DEXA SCAN  Completed   HPV  VACCINES  Aged Out    Health Maintenance  Health Maintenance Due  Topic Date Due   Zoster Vaccines- Shingrix (1 of 2) Never done    Colorectal cancer screening: No longer required.   Mammogram status: No longer required due to age.  Bone Density status: Completed 09/10/2021. Results reflect: Bone density results: OSTEOPOROSIS. Repeat every 2 years.  Lung Cancer Screening: (Low Dose CT Chest recommended if Age 60-80 years, 20 pack-year currently smoking OR have quit w/in 15years.) does not qualify.   Lung Cancer Screening Referral: na  Additional Screening:  Hepatitis C Screening: does not qualify  Vision Screening: Recommended annual ophthalmology exams for early detection of glaucoma and other disorders of the eye. Is the patient up to date with their annual eye exam?  Yes  Who is the provider or what is the name of the office in which the patient attends annual eye exams? [facility] If pt is not established with a provider, would they like to be referred to a provider to establish care? No .   Dental Screening: Recommended annual dental exams for proper oral hygiene  Community Resource Referral / Chronic Care Management: CRR required this visit?  No   CCM required this visit?  No     Plan:     I have personally reviewed and noted the following in the patient's chart:   Medical and social history Use of alcohol, tobacco or illicit drugs  Current medications and supplements including opioid prescriptions. Patient is currently taking opioid prescriptions. Information provided to patient regarding non-opioid alternatives. Patient advised to discuss non-opioid treatment plan with their provider. Functional ability and status Nutritional status Physical activity Advanced directives List of other physicians Hospitalizations, surgeries, and ER visits in previous 12 months Vitals Screenings to include cognitive, depression, and falls Referrals and appointments  In  addition, I have reviewed and discussed with patient certain preventive protocols, quality metrics, and best practice recommendations. A written personalized care plan for preventive services as well as general preventive health recommendations were provided to patient.     Sharon Seller, NP   11/10/2022   Place of service: [facility]

## 2022-11-11 ENCOUNTER — Encounter: Payer: Self-pay | Admitting: *Deleted

## 2022-11-12 ENCOUNTER — Ambulatory Visit
Admission: RE | Admit: 2022-11-12 | Discharge: 2022-11-12 | Disposition: A | Discharge status: Home / Self Care | Payer: Medicare Other | Source: Ambulatory Visit | Attending: Cardiovascular Disease | Admitting: Cardiovascular Disease

## 2022-11-12 DIAGNOSIS — R079 Chest pain, unspecified: Secondary | ICD-10-CM | POA: Insufficient documentation

## 2022-11-12 DIAGNOSIS — I209 Angina pectoris, unspecified: Secondary | ICD-10-CM

## 2022-11-12 DIAGNOSIS — R0602 Shortness of breath: Secondary | ICD-10-CM | POA: Diagnosis present

## 2022-11-12 MED ORDER — IVABRADINE HCL 7.5 MG PO TABS
15.0000 mg | ORAL_TABLET | Freq: Once | ORAL | Status: AC
  Administered 2022-11-12: 15 mg via ORAL

## 2022-11-12 MED ORDER — METOPROLOL TARTRATE 100 MG PO TABS
100.0000 mg | ORAL_TABLET | Freq: Once | ORAL | Status: AC
  Administered 2022-11-12: 100 mg via ORAL

## 2022-11-12 MED ORDER — IOHEXOL 350 MG/ML SOLN
75.0000 mL | Freq: Once | INTRAVENOUS | Status: AC | PRN
  Administered 2022-11-12: 75 mL via INTRAVENOUS

## 2022-11-12 MED ORDER — SODIUM CHLORIDE 0.9 % IV BOLUS
250.0000 mL | Freq: Once | INTRAVENOUS | Status: AC
  Administered 2022-11-12: 100 mL via INTRAVENOUS

## 2022-11-12 MED ORDER — NITROGLYCERIN 0.4 MG SL SUBL
0.8000 mg | SUBLINGUAL_TABLET | Freq: Once | SUBLINGUAL | Status: AC
  Administered 2022-11-12: 0.8 mg via SUBLINGUAL

## 2022-11-12 NOTE — Progress Notes (Signed)
Patient tolerated procedure well. Ambulate w/o difficulty. Denies light headedness or being dizzy. Sitting on side of scanner, water provided. Encouraged to drink extra water today and reasoning explained. Verbalized understanding. All questions answered. ABC intact. No further needs. Discharge from procedure area w/o issues.

## 2022-11-16 ENCOUNTER — Other Ambulatory Visit: Payer: Self-pay

## 2022-11-23 ENCOUNTER — Ambulatory Visit
Payer: Medicare Other | Attending: Student in an Organized Health Care Education/Training Program | Admitting: Student in an Organized Health Care Education/Training Program

## 2022-11-23 ENCOUNTER — Ambulatory Visit
Admission: RE | Admit: 2022-11-23 | Discharge: 2022-11-23 | Disposition: A | Discharge status: Home / Self Care | Payer: Medicare Other | Source: Ambulatory Visit | Attending: Student in an Organized Health Care Education/Training Program | Admitting: Student in an Organized Health Care Education/Training Program

## 2022-11-23 VITALS — BP 162/98 | HR 102 | Temp 97.7°F | Resp 12 | Ht 63.0 in | Wt 120.0 lb

## 2022-11-23 DIAGNOSIS — M5416 Radiculopathy, lumbar region: Secondary | ICD-10-CM | POA: Diagnosis present

## 2022-11-23 DIAGNOSIS — G8929 Other chronic pain: Secondary | ICD-10-CM | POA: Diagnosis present

## 2022-11-23 DIAGNOSIS — M48061 Spinal stenosis, lumbar region without neurogenic claudication: Secondary | ICD-10-CM | POA: Insufficient documentation

## 2022-11-23 MED ORDER — DEXAMETHASONE SODIUM PHOSPHATE 10 MG/ML IJ SOLN
10.0000 mg | Freq: Once | INTRAMUSCULAR | Status: AC
  Administered 2022-11-23: 10 mg

## 2022-11-23 MED ORDER — IOHEXOL 180 MG/ML  SOLN
10.0000 mL | Freq: Once | INTRAMUSCULAR | Status: AC
  Administered 2022-11-23: 10 mL via EPIDURAL

## 2022-11-23 MED ORDER — SODIUM CHLORIDE 0.9% FLUSH
2.0000 mL | Freq: Once | INTRAVENOUS | Status: AC
  Administered 2022-11-23: 10 mL

## 2022-11-23 MED ORDER — DEXAMETHASONE SODIUM PHOSPHATE 10 MG/ML IJ SOLN
INTRAMUSCULAR | Status: AC
  Filled 2022-11-23: qty 1

## 2022-11-23 MED ORDER — IOHEXOL 180 MG/ML  SOLN
INTRAMUSCULAR | Status: AC
  Filled 2022-11-23: qty 20

## 2022-11-23 MED ORDER — ROPIVACAINE HCL 2 MG/ML IJ SOLN
INTRAMUSCULAR | Status: AC
  Filled 2022-11-23: qty 20

## 2022-11-23 MED ORDER — LIDOCAINE HCL 2 % IJ SOLN
20.0000 mL | Freq: Once | INTRAMUSCULAR | Status: AC
  Administered 2022-11-23: 400 mg

## 2022-11-23 MED ORDER — LIDOCAINE HCL (PF) 2 % IJ SOLN
INTRAMUSCULAR | Status: AC
  Filled 2022-11-23: qty 5

## 2022-11-23 MED ORDER — SODIUM CHLORIDE (PF) 0.9 % IJ SOLN
INTRAMUSCULAR | Status: AC
  Filled 2022-11-23: qty 10

## 2022-11-23 MED ORDER — ROPIVACAINE HCL 2 MG/ML IJ SOLN
2.0000 mL | Freq: Once | INTRAMUSCULAR | Status: AC
  Administered 2022-11-23: 20 mL via EPIDURAL

## 2022-11-23 NOTE — Patient Instructions (Signed)

## 2022-11-23 NOTE — Progress Notes (Signed)
PROVIDER NOTE: Interpretation of information contained herein should be left to medically-trained personnel. Specific patient instructions are provided elsewhere under "Patient Instructions" section of medical record. This document was created in part using STT-dictation technology, any transcriptional errors that may result from this process are unintentional.  Patient: Sheila Newton Type: Established DOB: 04-Jun-1940 MRN: [id number] PCP: [name], MD  Service: Procedure DOS: 11/23/2022 Setting: Ambulatory Location: Ambulatory outpatient facility Delivery: Face-to-face Provider: Edward Jolly, MD Specialty: Interventional Pain Management Specialty designation: 09 Location: Outpatient facility Ref. Prov.: [name], MD       Interventional Therapy   Procedure: Lumbar epidural steroid injection (LESI) (interlaminar) #1    Laterality: Left   Level:  L4-5 Level.  Imaging: Fluoroscopic guidance         Anesthesia: Local anesthesia (1-2% Lidocaine) DOS: 11/23/2022  Performed by: Edward Jolly, MD  Purpose: Diagnostic/Therapeutic Indications: Lumbar radicular pain of intraspinal etiology of more than 4 weeks that has failed to respond to conservative therapy and is severe enough to impact quality of life or function. 1. Neuroforaminal stenosis of lumbar spine (L5/S1, severe bilateral)   2. Chronic radicular lumbar pain    NAS-11 Pain score:   Pre-procedure: 5 /10   Post-procedure: 0-No pain/10      Position / Prep / Materials:  Position: Prone w/ head of the table raised (slight reverse trendelenburg) to facilitate breathing.  Prep solution: DuraPrep (Iodine Povacrylex [0.7% available iodine] and Isopropyl Alcohol, 74% w/w) Prep Area: Entire Posterior Lumbar Region from lower scapular tip down to mid buttocks area and from flank to flank. Materials:  Tray: Epidural tray Needle(s):  Type: Epidural needle (Tuohy) Gauge (G):  22 Length: Regular (3.5-in) Qty: 1   H&P (Pre-op  Assessment):  Sheila Newton is a 82 y.o. (year old), female patient, seen today for interventional treatment. 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 Sheila Newton has a current medication list which includes the following prescription(s): alpha-lipoic acid, atorvastatin, buprenorphine, calcium carbonate, vitamin d3, coenzyme q10-vitamin e, cyanocobalamin, prempro, lubiprostone, magnesium, metoprolol tartrate, multivitamin adult, pantoprazole, prolia, tramadol, and tramadol. Her primarily concern today is the Back Pain  Initial Vital Signs:  Pulse/HCG Rate: (!) 106  Temp: 97.7 F (36.5 C) Resp: 16 BP: (!) 151/91 SpO2: 100 %  BMI: Estimated body mass index is 21.26 kg/m as calculated from the following:   Height as of this encounter: 5\' 3"  (1.6 m).   Weight as of this encounter: 120 lb (54.4 kg).  Risk Assessment: Allergies: Reviewed. She is allergic to sumatriptan, penicillin g, topiramate, and ilosone [erythromycin].  Allergy Precautions: None required Coagulopathies: Reviewed. None identified.  Blood-thinner therapy: None at this time Active Infection(s): Reviewed. None identified. Sheila Newton is afebrile  Site Confirmation: Sheila Newton was asked to confirm the procedure and laterality before marking the site Procedure checklist: Completed Consent: Before the procedure and under the influence of no sedative(s), amnesic(s), or anxiolytics, the patient was informed of the treatment options, risks and possible complications. To fulfill our ethical and legal obligations, as recommended by the American Medical Association's Code of Ethics, I have informed the patient of my clinical impression; the nature and purpose of the treatment or procedure; the risks, benefits, and possible complications of the intervention; the alternatives, including doing nothing; the risk(s) and  benefit(s) of the alternative treatment(s) or procedure(s); and the risk(s) and benefit(s) of doing nothing. The patient was provided information about the  general risks and possible complications associated with the procedure. These may include, but are not limited to: failure to achieve desired goals, infection, bleeding, organ or nerve damage, allergic reactions, paralysis, and death. In addition, the patient was informed of those risks and complications associated to Spine-related procedures, such as failure to decrease pain; infection (i.e.: Meningitis, epidural or intraspinal abscess); bleeding (i.e.: epidural hematoma, subarachnoid hemorrhage, or any other type of intraspinal or peri-dural bleeding); organ or nerve damage (i.e.: Any type of peripheral nerve, nerve root, or spinal cord injury) with subsequent damage to sensory, motor, and/or autonomic systems, resulting in permanent pain, numbness, and/or weakness of one or several areas of the body; allergic reactions; (i.e.: anaphylactic reaction); and/or death. Furthermore, the patient was informed of those risks and complications associated with the medications. These include, but are not limited to: allergic reactions (i.e.: anaphylactic or anaphylactoid reaction(s)); adrenal axis suppression; blood sugar elevation that in diabetics may result in ketoacidosis or comma; water retention that in patients with history of congestive heart failure may result in shortness of breath, pulmonary edema, and decompensation with resultant heart failure; weight gain; swelling or edema; medication-induced neural toxicity; particulate matter embolism and blood vessel occlusion with resultant organ, and/or nervous system infarction; and/or aseptic necrosis of one or more joints. Finally, the patient was informed that Medicine is not an exact science; therefore, there is also the possibility of unforeseen or unpredictable risks and/or possible complications that may  result in a catastrophic outcome. The patient indicated having understood very clearly. We have given the patient no guarantees and we have made no promises. Enough time was given to the patient to ask questions, all of which were answered to the patient's satisfaction. Sheila Newton has indicated that she wanted to continue with the procedure. Attestation: I, the ordering provider, attest that I have discussed with the patient the benefits, risks, side-effects, alternatives, likelihood of achieving goals, and potential problems during recovery for the procedure that I have provided informed consent. Date  Time: 11/23/2022  8:54 AM   Pre-Procedure Preparation:  Monitoring: As per clinic protocol. Respiration, ETCO2, SpO2, BP, heart rate and rhythm monitor placed and checked for adequate function Safety Precautions: Patient was assessed for positional comfort and pressure points before starting the procedure. Time-out: I initiated and conducted the "Time-out" before starting the procedure, as per protocol. The patient was asked to participate by confirming the accuracy of the "Time Out" information. Verification of the correct person, site, and procedure were performed and confirmed by me, the nursing staff, and the patient. "Time-out" conducted as per Joint Commission's Universal Protocol (UP.01.01.01). Time: 0937 Start Time: 0938 hrs.  Description/Narrative of Procedure:          Target: Epidural space via interlaminar opening, initially targeting the lower laminar border of the superior vertebral body. Region: Lumbar Approach: Percutaneous paravertebral  Rationale (medical necessity): procedure needed and proper for the diagnosis and/or treatment of the patient's medical symptoms and needs. Procedural Technique Safety Precautions: Aspiration looking for blood return was conducted prior to all injections. At no point did we inject any substances, as a needle was being advanced. No attempts were made at  seeking any paresthesias. Safe injection practices and needle disposal techniques used. Medications properly checked for expiration dates. SDV (single dose vial) medications used. Description of the Procedure: Protocol guidelines were followed. The procedure needle was introduced through the skin, ipsilateral to the reported pain, and advanced to the target area. Bone was contacted and the needle walked  caudad, until the lamina was cleared. The epidural space was identified using "loss-of-resistance technique" with 2-3 ml of PF-NaCl (0.9% NSS), in a 5cc LOR glass syringe.  6 cc solution made of 3 cc of preservative-free saline, 2 cc of 0.2% ropivacaine, 1 cc of Decadron 10 mg/cc.   Vitals:   11/23/22 0859 11/23/22 0938 11/23/22 0942  BP: (!) 151/91 (!) 161/101 (!) 162/98  Pulse: (!) 106 (!) 111 (!) 102  Resp: 16 12 12   Temp: 97.7 F (36.5 C)    SpO2:  100% 100%  Weight: 120 lb (54.4 kg)    Height: 5\' 3"  (1.6 m)      Start Time: 0938 hrs. End Time: 0940 hrs.  Imaging Guidance (Spinal):          Type of Imaging Technique: Fluoroscopy Guidance (Spinal) Indication(s): Assistance in needle guidance and placement for procedures requiring needle placement in or near specific anatomical locations not easily accessible without such assistance. Exposure Time: Please see nurses notes. Contrast: Before injecting any contrast, we confirmed that the patient did not have an allergy to iodine, shellfish, or radiological contrast. Once satisfactory needle placement was completed at the desired level, radiological contrast was injected. Contrast injected under live fluoroscopy. No contrast complications. See chart for type and volume of contrast used. Fluoroscopic Guidance: I was personally present during the use of fluoroscopy. "Tunnel Vision Technique" used to obtain the best possible view of the target area. Parallax error corrected before commencing the procedure. "Direction-depth-direction" technique  used to introduce the needle under continuous pulsed fluoroscopy. Once target was reached, antero-posterior, oblique, and lateral fluoroscopic projection used confirm needle placement in all planes. Images permanently stored in EMR. Interpretation: I personally interpreted the imaging intraoperatively. Adequate needle placement confirmed in multiple planes. Appropriate spread of contrast into desired area was observed. No evidence of afferent or efferent intravascular uptake. No intrathecal or subarachnoid spread observed. Permanent images saved into the patient's record.  Antibiotic Prophylaxis:   Anti-infectives (From admission, onward)    None      Indication(s): None identified  Post-operative Assessment:  Post-procedure Vital Signs:  Pulse/HCG Rate: (!) 102  Temp: 97.7 F (36.5 C) Resp: 12 BP: (!) 162/98 SpO2: 100 %  EBL: None  Complications: No immediate post-treatment complications observed by team, or reported by patient.  Patient was complaining of mild numbness of both legs.  She states that she felt weak.  5 out of 5 strength on physical examination to knee extension, plantarflexion and dorsiflexion.  No headaches.  Patient sitting upright.  Pain is a 0 out of 10.  Will continue to monitor.  Reassured patient that this can sometimes happen and that it will get better later this afternoon.  Note: The patient tolerated the entire procedure well. A repeat set of vitals were taken after the procedure and the patient was kept under observation following institutional policy, for this type of procedure. Post-procedural neurological assessment was performed, showing return to baseline, prior to discharge. The patient was provided with post-procedure discharge instructions, including a section on how to identify potential problems. Should any problems arise concerning this procedure, the patient was given instructions to immediately contact us, at any time, without hesitation. In any  case, we plan to contact the patient by telephone for a follow-up status report regarding this interventional procedure.  Comments:  No additional relevant information.  Plan of Care (POC)  Orders:  Orders Placed This Encounter  Procedures   DG PAIN CLINIC C-ARM 1-60 MIN NO  REPORT    Intraoperative interpretation by procedural physician at [facility].    Standing Status:   Standing    Number of Occurrences:   1    Order Specific Question:   Reason for exam:    Answer:   Assistance in needle guidance and placement for procedures requiring needle placement in or near specific anatomical locations not easily accessible without such assistance.   Chronic Opioid Analgesic:  Butrans patch 20 mcg an hour, tramadol 50 mg twice daily as needed for breakthrough pain, tramadol ER 100 mg nightly   Medications ordered for procedure: Meds ordered this encounter  Medications   iohexol (OMNIPAQUE) 180 MG/ML injection 10 mL    Must be Myelogram-compatible. If not available, you may substitute with a water-soluble, non-ionic, hypoallergenic, myelogram-compatible radiological contrast medium.   lidocaine (XYLOCAINE) 2 % (with pres) injection 400 mg   ropivacaine (PF) 2 mg/mL (0.2%) (NAROPIN) injection 2 mL   sodium chloride flush (NS) 0.9 % injection 2 mL   dexamethasone (DECADRON) injection 10 mg   Medications administered: We administered iohexol, lidocaine, ropivacaine (PF) 2 mg/mL (0.2%), sodium chloride flush, and dexamethasone.  See the medical record for exact dosing, route, and time of administration.  Follow-up plan:   Return in about 4 weeks (around 12/21/2022) for Post Procedure Evaluation, in person.       Recent Visits Date Type Provider Dept  10/29/22 Office Visit Edward Jolly, MD [facility]  Showing recent visits within past 90 days and meeting all other requirements Today's Visits Date Type Provider Dept  11/23/22 Procedure visit Edward Jolly, MD  [facility]  Showing today's visits and meeting all other requirements Future Appointments Date Type Provider Dept  12/23/22 Appointment Edward Jolly, MD [facility]  01/26/23 Appointment Edward Jolly, MD [facility]  Showing future appointments within next 90 days and meeting all other requirements  Disposition: Discharge home  Discharge (Date  Time): 11/23/2022;   hrs.   Primary Care Physician: [name], MD Location: [facility] Note by: Edward Jolly, MD (TTS technology used. I apologize for any typographical errors that were not detected and corrected.) Date: 11/23/2022; Time: 10:37 AM  Disclaimer:  Medicine is not an Visual merchandiser. The only guarantee in medicine is that nothing is guaranteed. It is important to note that the decision to proceed with this intervention was based on the information collected from the patient. The Data and conclusions were drawn from the patient's questionnaire, the interview, and the physical examination. Because the information was provided in large part by the patient, it cannot be guaranteed that it has not been purposely or unconsciously manipulated. Every effort has been made to obtain as much relevant data as possible for this evaluation. It is important to note that the conclusions that lead to this procedure are derived in large part from the available data. Always take into account that the treatment will also be dependent on availability of resources and existing treatment guidelines, considered by other Pain Management Practitioners as being common knowledge and practice, at the time of the intervention. For Medico-Legal purposes, it is also important to point out that variation in procedural techniques and pharmacological choices are the acceptable norm. The indications, contraindications, technique, and results of the above procedure should only be interpreted and judged by a  Board-Certified Interventional Pain Specialist with extensive familiarity and expertise in the same exact procedure and technique.

## 2022-11-23 NOTE — Progress Notes (Signed)
Having numbness in both legs. Explained to patient that this may occur, there is no complication. Dr. Cherylann Ratel aware.

## 2022-11-24 ENCOUNTER — Telehealth: Payer: Self-pay | Admitting: *Deleted

## 2022-11-24 ENCOUNTER — Telehealth: Payer: Self-pay | Admitting: Cardiovascular Disease

## 2022-11-24 ENCOUNTER — Telehealth: Payer: Self-pay | Admitting: Student in an Organized Health Care Education/Training Program

## 2022-11-24 NOTE — Telephone Encounter (Signed)
Patient is returning call in regards to results. Requesting call back. 

## 2022-11-24 NOTE — Telephone Encounter (Signed)
FYI, PT called back states that she is doing well since the procedure on yesterday. Thanks

## 2022-11-24 NOTE — Telephone Encounter (Signed)
Attempted to call for post procedure follow-up. Message left. 

## 2022-11-24 NOTE — Telephone Encounter (Signed)
Patient advised of providers notes regarding cardiac CT. Patient verbalizes understanding and no further questions at this time.   Cardiac CTA Coronary calcium score 711 Mild nonobstructive coronary disease noted No significant stenosis that would explain symptoms of shortness of breath or chest pain Waiting for radiology over-read of images

## 2022-11-27 ENCOUNTER — Ambulatory Visit: Payer: Medicare Other | Attending: Cardiovascular Disease

## 2022-11-27 DIAGNOSIS — R0602 Shortness of breath: Secondary | ICD-10-CM | POA: Diagnosis not present

## 2022-11-27 DIAGNOSIS — I209 Angina pectoris, unspecified: Secondary | ICD-10-CM | POA: Insufficient documentation

## 2022-11-27 LAB — ECHOCARDIOGRAM COMPLETE
AR max vel: 2.7 cm2
AV Area VTI: 2.6 cm2
AV Area mean vel: 2.62 cm2
AV Mean grad: 2 mmHg
AV Peak grad: 3.9 mmHg
AV Vena cont: 0.4 cm
Ao pk vel: 0.99 m/s
Area-P 1/2: 7.16 cm2
Calc EF: 54.2 %
S' Lateral: 2.1 cm
Single Plane A2C EF: 57.2 %
Single Plane A4C EF: 54 %

## 2022-12-01 ENCOUNTER — Telehealth: Payer: Self-pay

## 2022-12-01 ENCOUNTER — Other Ambulatory Visit: Payer: Self-pay

## 2022-12-01 DIAGNOSIS — L719 Rosacea, unspecified: Secondary | ICD-10-CM

## 2022-12-01 NOTE — Telephone Encounter (Signed)
Refill request received from pharmacy for metronidazole cream. Medication is not on patient's active medication list nor discontinued medication list.  Message has been sent to Dr. [name].

## 2022-12-03 MED ORDER — METRONIDAZOLE 1 % EX GEL
Freq: Every day | CUTANEOUS | 0 refills | Status: DC
Start: 2022-12-03 — End: 2023-06-07

## 2022-12-03 NOTE — Telephone Encounter (Signed)
Patient uses for Rosacea treatment. Refill at this time.

## 2022-12-03 NOTE — Telephone Encounter (Signed)
Pharmacy called regarding refill on medication. Please advise.

## 2022-12-07 ENCOUNTER — Other Ambulatory Visit: Payer: Self-pay

## 2022-12-09 ENCOUNTER — Encounter: Payer: Self-pay | Admitting: Student

## 2022-12-09 ENCOUNTER — Ambulatory Visit: Payer: Medicare Other | Admitting: Student

## 2022-12-09 VITALS — BP 128/76 | HR 82 | Temp 96.1°F | Ht 63.0 in | Wt 120.0 lb

## 2022-12-09 DIAGNOSIS — G5703 Lesion of sciatic nerve, bilateral lower limbs: Secondary | ICD-10-CM | POA: Diagnosis not present

## 2022-12-09 DIAGNOSIS — K219 Gastro-esophageal reflux disease without esophagitis: Secondary | ICD-10-CM

## 2022-12-09 DIAGNOSIS — R0602 Shortness of breath: Secondary | ICD-10-CM | POA: Diagnosis not present

## 2022-12-09 NOTE — Patient Instructions (Addendum)
You will receive a call to schedule your Pulmonary Function Tests as well as to schedule an appointment with the pulmonologist. You can delay it to accommodate your upcoming vacation.

## 2022-12-09 NOTE — Progress Notes (Signed)
[facility].   Provider: Dr. [name]  Code Status: Full Code Goals of Care:     12/09/2022    2:56 PM  Advanced Directives  Does Patient Have a Medical Advance Directive? Yes  Type of Estate agent of [place];Living will  Does patient want to make changes to medical advance directive? No - Patient declined  Copy of Healthcare Power of Attorney in Chart? No - copy requested     Chief Complaint  Patient presents with   Acute Visit    Follow up Dizziness, SOB and Indigestion. No better.    Quality Metric Gaps    To discuss need for Zoster and Covid.     HPI: Patient is a 82 y.o. female seen today for an acute visit to follow up Dizziness, SOB and Indigestion. Patient states that symptoms are no Better.   Sciatica improving with physical therapy  Dizziness and SOB - worsening and to the point that she can't do what she enjoys and needs to do. She had ENT and they dd a work up there. She completed some work up with Dr. Mariah Milling. Sometimes she has to grab onto something. The room is spinning. No ringing in her ears.   CTA - Calc score 711, non obstructive disease.   Because of sciatica she hasn't been able to go to the exercise classes that she typically does.   Shortness of breath is worse with hot weather. Within 5-10 minutes she gets short of breath. Climbing stairs she has felt that way. Typically no issues in the house.  CT scan showed some atelectasis potential chronic bronchitis. 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 More noise than lung exposure.   Indigestion - she burps a lot she has been taking protonix. She burps a lot and passing gas a lot. She tried gasX. It hasn't fixed the problem.   Since January and she returned to a normal diet it's gotten worse. She couldn't eat a lot of stringy meet, peeled fruits, no leafy vegetables.  Less ruffage. She tolerated dairy before and ater. No pain, but she does have some gas bubbles.  Past Medical History:  Diagnosis Date   Small fiber polyneuropathy    Spondylolysis     Past Surgical History:  Procedure Laterality Date   ABDOMINAL HYSTERECTOMY     back pain     lumbar   RADICAL VAGINAL HYSTERECTOMY  2017   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    Allergies  Allergen Reactions   Sumatriptan Anxiety, Other (See Comments) and Palpitations     Felt weak  Felt weak  Felt weak  Felt weak    Penicillin G Anxiety and Other (See Comments)    Reaction unknown given a lot of it as a child. Was told not to take anymore of it Reaction unknown given a lot of it as a child. Was told not to take anymore of it Reaction unknown given a lot of it as a child. Was told not to take anymore of it Reaction unknown given a lot of it as a child. Was told not to take anymore of it    Topiramate Other (See Comments)   Ilosone [Erythromycin] Other (See Comments)    Outpatient Encounter Medications as of 12/09/2022  Medication Sig   Alpha-Lipoic Acid 600 MG CAPS Take one by mouth once daily.  atorvastatin (LIPITOR) 20 MG tablet Take by mouth daily.   buprenorphine (BUTRANS) 20 MCG/HR PTWK Place 1 patch onto the skin once a week. For chronic pain syndrome. Ok for pt to fill up to 3 days earlier than fill date.   Calcium Carbonate (CALCIUM 500 PO) Take one by mouth once daily.   Cholecalciferol (VITAMIN D3) 50 MCG (2000 UT) TABS Take one tablet by mouth daily.   Coenzyme Q10-Vitamin E (QUNOL ULTRA COQ10 PO) Take by mouth daily.   Cyanocobalamin (VITAMIN B 12 PO) Take by mouth daily.   estrogen, conjugated,-medroxyprogesterone (PREMPRO) 0.3-1.5 MG tablet Take 1 tablet by mouth daily. (Patient taking differently: Take 1 tablet by mouth daily. On Monday, Wednesday and Friday.)   lubiprostone (AMITIZA) 8 MCG capsule Take 8 mcg by mouth 2 (two) times daily as needed for constipation.    Magnesium 200 MG CHEW Chew by mouth daily.   metoprolol tartrate (LOPRESSOR) 50 MG tablet Take 1 tablet (50 mg total) by mouth once 2 hours prior to CTA   metroNIDAZOLE (METROGEL) 1 % gel Apply topically daily.   Multiple Vitamins-Minerals (MULTIVITAMIN ADULT) CHEW Chew by mouth daily.   pantoprazole (PROTONIX) 40 MG tablet Take 1 tablet (40 mg total) by mouth daily.   PROLIA 60 MG/ML SOSY injection Inject 60 mg into the skin as needed. Twice a year.   traMADol (ULTRAM) 50 MG tablet Take 1 tablet (50 mg total) by mouth 2 (two) times daily as needed for severe pain. Ok to pick up 2-3 days prior to fill date   traMADol (ULTRAM-ER) 100 MG 24 hr tablet Take 1 tablet (100 mg total) by mouth at bedtime. Ok for pt to fill up to 3 days earlier than fill date.   No facility-administered encounter medications on file as of 12/09/2022.    Review of Systems:  Review of Systems  Health Maintenance  Topic Date Due   Zoster Vaccines- Shingrix (1 of 2) 07/23/1959   COVID-19 Vaccine (5 - 2023-24 season) 10/19/2025 (Originally 01/23/2022)   INFLUENZA VACCINE  12/24/2022   Medicare Annual Wellness (AWV)  11/10/2023   DTaP/Tdap/Td (2 - Td or Tdap) 12/20/2028   Pneumonia Vaccine 64+ Years old  Completed   DEXA SCAN  Completed   HPV VACCINES  Aged Out    Physical Exam: Vitals:   12/09/22 1451  BP: 128/76  Pulse: 82  Temp: (!) 96.1 F (35.6 C)  SpO2: 96%  Weight: 120 lb (54.4 kg)  Height: 5\' 3"  (1.6 m)   Body mass index is 21.26 kg/m. Physical Exam Constitutional:      Appearance: Normal appearance.  Cardiovascular:     Rate and Rhythm: Normal rate and regular rhythm.     Pulses: Normal pulses.     Heart sounds: Normal heart sounds.  Pulmonary:     Effort: Pulmonary effort is normal.     Breath sounds: Normal breath sounds.  Abdominal:     General: Abdomen is flat.     Palpations: Abdomen is soft.  Neurological:     Mental Status: She is alert and oriented to person, place, and time.      Labs reviewed: Basic Metabolic Panel: Recent Labs    06/08/22 0000  NA 141  CL 105  CO2 30*  BUN 22*  CREATININE 0.9  CALCIUM 8.9   Liver Function Tests: Recent Labs    06/08/22 0000  AST 21  ALT 14  ALKPHOS 37  ALBUMIN 4.0   No results for input(s): "LIPASE", "AMYLASE"  in the last 8760 hours. No results for input(s): "AMMONIA" in the last 8760 hours. CBC: Recent Labs    06/08/22 0000  WBC 5.7  NEUTROABS 3,232.00  HGB 11.3*  HCT 35*   Lipid Panel: No results for input(s): "CHOL", "HDL", "LDLCALC", "TRIG", "CHOLHDL", "LDLDIRECT" in the last 8760 hours. No results found for: "HGBA1C"  Procedures since last visit: ECHOCARDIOGRAM COMPLETE  Result Date: 11/27/2022    ECHOCARDIOGRAM REPORT   Patient Name:   Sheila Newton  Date of Exam: 11/27/2022 Medical Rec #:  [id number]  Height:       63.0 in Accession #:    [id number] Weight:       120.0 lb Date of Birth:  03-31-1941  BSA:          1.556 m Patient Age:    82 years   BP:           136/82 mmHg Patient Gender: F          HR:           90 bpm. Exam Location:  [facility] Procedure: 2D Echo, Cardiac Doppler and Color Doppler Indications:    R06.02 SOB; R07.9* Chest pain, unspecified  History:        Patient has no prior history of Echocardiogram examinations.                 Signs/Symptoms:Chest Pain and Shortness of Breath; Risk                 Factors:Dyslipidemia and Non-Smoker.  Sonographer:    Quentin Ore RDMS, RVT, RDCS Referring Phys: [id number] Antonieta Iba  Sonographer Comments: Technically challenging study due to limited acoustic windows. IMPRESSIONS  1. Left ventricular ejection fraction, by estimation, is 55 to 60%. The left ventricle has normal function. The left ventricle has no regional wall motion abnormalities. There is mild left ventricular hypertrophy. Left ventricular diastolic parameters are consistent with Grade I diastolic dysfunction (impaired relaxation).  2. Right ventricular systolic function is normal. The  right ventricular size is normal.  3. The mitral valve is normal in structure. No evidence of mitral valve regurgitation.  4. The aortic valve is tricuspid. Aortic valve regurgitation is mild.  5. The inferior vena cava is normal in size with greater than 50% respiratory variability, suggesting right atrial pressure of 3 mmHg. FINDINGS  Left Ventricle: Left ventricular ejection fraction, by estimation, is 55 to 60%. The left ventricle has normal function. The left ventricle has no regional wall motion abnormalities. The left ventricular internal cavity size was normal in size. There is  mild left ventricular hypertrophy. Left ventricular diastolic parameters are consistent with Grade I diastolic dysfunction (impaired relaxation). Right Ventricle: The right ventricular size is normal. No increase in right ventricular wall thickness. Right ventricular systolic function is normal. Left Atrium: Left atrial size was normal in size. Right Atrium: Right atrial size was normal in size. Pericardium: There is no evidence of pericardial effusion. Mitral Valve: The mitral valve is normal in structure. No evidence of mitral valve regurgitation. Tricuspid Valve: The tricuspid valve is normal in structure. Tricuspid valve regurgitation is mild. Aortic Valve: The aortic valve is tricuspid. Aortic valve regurgitation is mild. Aortic valve mean gradient measures 2.0 mmHg. Aortic valve peak gradient measures 3.9 mmHg. Aortic valve area, by VTI measures 2.60 cm. Pulmonic Valve: The pulmonic valve was normal in structure. Pulmonic valve regurgitation is mild. Aorta: The aortic root and ascending aorta are structurally normal, with no evidence of  dilitation. Venous: The inferior vena cava is normal in size with greater than 50% respiratory variability, suggesting right atrial pressure of 3 mmHg. IAS/Shunts: No atrial level shunt detected by color flow Doppler.  LEFT VENTRICLE PLAX 2D LVIDd:         3.30 cm     Diastology LVIDs:          2.10 cm     LV e' medial:    5.66 cm/s LV PW:         0.90 cm     LV E/e' medial:  8.6 LV IVS:        1.50 cm     LV e' lateral:   7.40 cm/s LVOT diam:     1.90 cm     LV E/e' lateral: 6.5 LV SV:         47 LV SV Index:   30 LVOT Area:     2.84 cm  LV Volumes (MOD) LV vol d, MOD A2C: 59.6 ml LV vol d, MOD A4C: 51.4 ml LV vol s, MOD A2C: 25.5 ml LV vol s, MOD A4C: 23.7 ml LV SV MOD A2C:     34.1 ml LV SV MOD A4C:     51.4 ml LV SV MOD BP:      30.6 ml RIGHT VENTRICLE             IVC RV Basal diam:  3.20 cm     IVC diam: 1.20 cm RV Mid diam:    2.60 cm RV S prime:     12.90 cm/s TAPSE (M-mode): 1.8 cm LEFT ATRIUM           Index       RIGHT ATRIUM           Index LA diam:      3.50 cm 2.25 cm/m  RA Area:     14.20 cm LA Vol (A4C): 14.1 ml 9.06 ml/m  RA Volume:   33.10 ml  21.27 ml/m  AORTIC VALVE                    PULMONIC VALVE AV Area (Vmax):    2.70 cm     PV Vmax:          0.74 m/s AV Area (Vmean):   2.62 cm     PV Peak grad:     2.2 mmHg AV Area (VTI):     2.60 cm     PR End Diast Vel: 7.18 msec AV Vmax:           98.70 cm/s AV Vmean:          70.133 cm/s AV VTI:            0.182 m AV Peak Grad:      3.9 mmHg AV Mean Grad:      2.0 mmHg LVOT Vmax:         94.13 cm/s LVOT Vmean:        64.833 cm/s LVOT VTI:          0.167 m LVOT/AV VTI ratio: 0.92 AR Vena Contracta: 0.40 cm  AORTA Ao Root diam: 3.10 cm Ao Asc diam:  2.40 cm Ao Arch diam: 2.7 cm MITRAL VALVE               TRICUSPID VALVE MV Area (PHT): 7.16 cm    TR Peak grad:   20.8 mmHg MV Decel Time: 106 msec    TR Vmax:  228.00 cm/s MV E velocity: 48.40 cm/s MV A velocity: 85.80 cm/s  SHUNTS MV E/A ratio:  0.56        Systemic VTI:  0.17 m                            Systemic Diam: 1.90 cm Debbe Odea MD Electronically signed by Debbe Odea MD Signature Date/Time: 11/27/2022/12:51:07 PM    Final    DG PAIN CLINIC C-ARM 1-60 MIN NO REPORT  Result Date: 11/23/2022 Fluoro was used, but no Radiologist interpretation will be provided.  Please refer to "NOTES" tab for provider progress note.  CT CORONARY MORPH W/CTA COR W/SCORE W/CA W/CM &/OR WO/CM  Addendum Date: 11/17/2022   ADDENDUM REPORT: 11/17/2022 19:58 EXAM: OVER-READ INTERPRETATION  CT CHEST The following report is an over-read performed by radiologist Dr. Curly Shores [facility] on 11/17/2022. This over-read does not include interpretation of cardiac or coronary anatomy or pathology. The coronary CTA interpretation by the cardiologist is attached. COMPARISON:  None. FINDINGS: Cardiovascular:  Findings discussed in the body of the report. Mediastinum/Nodes: No suspicious adenopathy identified. Imaged mediastinal structures are unremarkable. Lungs/Pleura: No pleural effusion or pneumothorax. Bibasilar linear subsegmental atelectasis or scarring. Prominence of the bronchi consistent with chronic bronchitis. Upper Abdomen: No acute abnormality. Musculoskeletal: No chest wall abnormality. No acute or significant osseous findings. IMPRESSION: Basilar subsegmental atelectasis or scarring and evidence of chronic bronchitis. Electronically Signed   By: Layla Maw M.D.   On: 11/17/2022 19:58   Result Date: 11/17/2022 CLINICAL DATA:  Chest pain EXAM: Cardiac/Coronary  CTA TECHNIQUE: The patient was scanned on a Siemens Somatom go.Top scanner. : A retrospective scan was triggered in the ascending thoracic aorta. Axial non-contrast 3 mm slices were carried out through the heart. The data set was analyzed on a dedicated work station and scored using the Agatson method. Gantry rotation speed was 330 msecs and collimation was .6 mm. 100mg  of metoprolol and 0.8 mg of sl NTG was given. The 3D data set was reconstructed in 5% intervals of the 60-95 % of the R-R cycle. Diastolic phases were analyzed on a dedicated work station using MPR, MIP and VRT modes. The patient received 75 cc of contrast. FINDINGS: Aorta: Normal size. Descending aorta calcifications.  No dissection.  Aortic Valve:  Trileaflet.  No calcifications. Coronary Arteries:  Normal coronary origin.  Right dominance. RCA is a dominant artery. There is calcified plaque proximally causing minimal stenosis (<25%). Left main gives rise to LAD and LCX arteries. LM has no disease. LAD has calcified plaque in the mid segment causing mild stenosis (25-49%). LCX is a non-dominant artery. There is calcified plaque proximally causing mild stenosis (<25%). Other findings: Normal pulmonary vein drainage into the left atrium. Normal left atrial appendage without a thrombus. Normal size of the pulmonary artery. IMPRESSION: 1. Coronary calcium score of 711. This was 83rd percentile for age and sex matched control. 2. Normal coronary origin with right dominance. 3. Mild proximal RCA and mid LAD stenosis (25-49%). 4. Minimal LCx stenosis (<25%). 5. CAD-RADS 2. Mild non-obstructive CAD (25-49%). Consider non-atherosclerotic causes of chest pain. Consider preventive therapy and risk factor modification. Electronically Signed: By: Debbe Odea M.D. On: 11/12/2022 14:32    Assessment/Plan Shortness of breath - Plan: Pulmonary function test, Ambulatory referral to Pulmonology  Piriformis syndrome of both sides Patient with persistent shortness of breath. Negative cardiac workup. Atelectasis vs chronic bronchitis noted on Cardiac CT scan.  PFTs and pulmonology. Piriformis syndrome improved- encouraged physical activity. GERD will trial elimination diet after vacation.   Labs/tests ordered:  * No order type specified * Next appt:  01/08/2023

## 2022-12-21 ENCOUNTER — Other Ambulatory Visit: Payer: Self-pay

## 2022-12-21 ENCOUNTER — Ambulatory Visit: Payer: Medicare Other | Admitting: Student in an Organized Health Care Education/Training Program

## 2022-12-22 ENCOUNTER — Other Ambulatory Visit: Payer: Self-pay

## 2022-12-23 ENCOUNTER — Ambulatory Visit: Payer: Medicare Other | Admitting: Student in an Organized Health Care Education/Training Program

## 2022-12-25 ENCOUNTER — Other Ambulatory Visit: Payer: Self-pay

## 2022-12-28 ENCOUNTER — Other Ambulatory Visit: Payer: Self-pay

## 2023-01-01 ENCOUNTER — Ambulatory Visit: Payer: Medicare Other | Admitting: Student

## 2023-01-04 ENCOUNTER — Ambulatory Visit: Payer: Medicare Other | Admitting: Student

## 2023-01-05 ENCOUNTER — Other Ambulatory Visit: Payer: Self-pay

## 2023-01-07 ENCOUNTER — Other Ambulatory Visit
Admission: RE | Admit: 2023-01-07 | Discharge: 2023-01-07 | Disposition: A | Discharge status: Home / Self Care | Payer: Medicare Other | Source: Ambulatory Visit | Attending: Pulmonary Disease | Admitting: Pulmonary Disease

## 2023-01-07 ENCOUNTER — Encounter: Payer: Self-pay | Admitting: Pulmonary Disease

## 2023-01-07 ENCOUNTER — Ambulatory Visit (INDEPENDENT_AMBULATORY_CARE_PROVIDER_SITE_OTHER): Payer: Medicare Other | Admitting: Pulmonary Disease

## 2023-01-07 VITALS — BP 130/84 | HR 85 | Temp 97.7°F | Ht 63.0 in | Wt 121.0 lb

## 2023-01-07 DIAGNOSIS — R42 Dizziness and giddiness: Secondary | ICD-10-CM | POA: Diagnosis not present

## 2023-01-07 DIAGNOSIS — R0602 Shortness of breath: Secondary | ICD-10-CM

## 2023-01-07 DIAGNOSIS — D649 Anemia, unspecified: Secondary | ICD-10-CM

## 2023-01-07 DIAGNOSIS — R5383 Other fatigue: Secondary | ICD-10-CM | POA: Diagnosis not present

## 2023-01-07 LAB — COMPREHENSIVE METABOLIC PANEL
ALT: 18 U/L (ref 0–44)
AST: 23 U/L (ref 15–41)
Albumin: 3.7 g/dL (ref 3.5–5.0)
Alkaline Phosphatase: 40 U/L (ref 38–126)
Anion gap: 5 (ref 5–15)
BUN: 22 mg/dL (ref 8–23)
CO2: 24 mmol/L (ref 22–32)
Calcium: 9.1 mg/dL (ref 8.9–10.3)
Chloride: 106 mmol/L (ref 98–111)
Creatinine, Ser: 1.03 mg/dL — ABNORMAL HIGH (ref 0.44–1.00)
GFR, Estimated: 54 mL/min — ABNORMAL LOW (ref >60)
Glucose, Bld: 86 mg/dL (ref 70–99)
Potassium: 5.2 mmol/L — ABNORMAL HIGH (ref 3.5–5.1)
Sodium: 135 mmol/L (ref 135–145)
Total Bilirubin: 0.4 mg/dL (ref 0.3–1.2)
Total Protein: 6.9 g/dL (ref 6.5–8.1)

## 2023-01-07 LAB — TSH: TSH: 1.326 u[IU]/mL (ref 0.350–4.500)

## 2023-01-07 LAB — T4, FREE: Free T4: 0.69 ng/dL (ref 0.61–1.12)

## 2023-01-07 LAB — CBC WITH DIFFERENTIAL/PLATELET
Abs Immature Granulocytes: 0.03 10*3/uL (ref 0.00–0.07)
Basophils Absolute: 0 10*3/uL (ref 0.0–0.1)
Basophils Relative: 1 %
Eosinophils Absolute: 0.2 10*3/uL (ref 0.0–0.5)
Eosinophils Relative: 3 %
HCT: 36.7 % (ref 36.0–46.0)
Hemoglobin: 11.6 g/dL — ABNORMAL LOW (ref 12.0–15.0)
Immature Granulocytes: 0 %
Lymphocytes Relative: 19 %
Lymphs Abs: 1.4 10*3/uL (ref 0.7–4.0)
MCH: 29 pg (ref 26.0–34.0)
MCHC: 31.6 g/dL (ref 30.0–36.0)
MCV: 91.8 fL (ref 80.0–100.0)
Monocytes Absolute: 0.5 10*3/uL (ref 0.1–1.0)
Monocytes Relative: 7 %
Neutro Abs: 5.2 10*3/uL (ref 1.7–7.7)
Neutrophils Relative %: 70 %
Platelets: 213 10*3/uL (ref 150–400)
RBC: 4 MIL/uL (ref 3.87–5.11)
RDW: 14 % (ref 11.5–15.5)
WBC: 7.4 10*3/uL (ref 4.0–10.5)
nRBC: 0.3 % — ABNORMAL HIGH (ref 0.0–0.2)

## 2023-01-07 NOTE — Patient Instructions (Addendum)
We are ordering some blood work today.  Checking on your anemia.  Also checking thyroid function.  We have ordered breathing tests.  These will likely be done after you get back in town in September.  We will see you in follow-up in 6 to 8 weeks time call sooner should any new problems arise.  We will let you know the results of the blood test when these are available.

## 2023-01-07 NOTE — Progress Notes (Signed)
Subjective:    Patient ID: Sheila Newton, female    DOB: [birth year], 82 y.o.   MRN: [id number]  Patient Care Team: [name], MD as PCP - General [facility])  Chief Complaint  Patient presents with   Consult    DOE and dizziness for years. Has seen ENT and Cardiology and was cleared by both. No wheezing or cough. Increased hoarseness over the years.    HPI Sheila Newton is an 82 year old lifelong never smoker with history as noted below who presents for evaluation of dyspnea and dizziness that have been present for "years".  The patient is kindly referred by Dr. [name].  The patient states that she has had the symptoms for many many years.  She states that she has just decided to do "something about it" as the symptoms are starting to affect her enjoyment of life.  The patient notes that when she is exposed to warmer and more humid climate she gets short of breath within minutes.  Exertion of any kind will also exacerbate the symptoms.  This symptom is mostly aggravated by bending over.  Symptoms also aggravated by "rushing".  She notes that rest, sitting and being in a cool environment such as air conditioning helps her symptoms greatly.  She has not had any wheezing, no cough or sputum production.  No hemoptysis.  Does not endorse any chest pain.  She does note significant amount of burping at times that precedes her dyspnea symptoms.  She has been diagnosed in the past with a Zenker's diverticulum that would give her the sensation of globus, she had to have this repaired.  She has had issues with chronic pain for a number of years and is on tramadol, and a Butrans patch for control of the symptoms.  She is managed by the pain clinic.  Unfortunately, these medications have a side effect of respiratory depression and dizziness.  She does not endorse any fevers, chills or sweats.  No overt orthostatic symptoms.  Does occasionally feel like the room is 'spinning".  She has not had any weight  loss or anorexia.  No other symptomatology.    Patient has recently been worked up by cardiology, had an echocardiogram performed on 5 July that showed normal left and right ventricular function and no significant valvular disease.  Cardiac CTA did not show any coronary occlusion of concern.  It was recommended that she engage in a regular walking program for conditioning.  Of note lung windows from the cardiac CTA showed some mild basilar subsegmental atelectasis or scarring with evidence of chronic bronchitis.  Review of Systems A 10 point review of systems was performed and it is as noted above otherwise negative.   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   Hot flashes 09/02/2022   Drug-induced constipation 09/02/2022   Anemia 09/02/2022   Dry mouth 09/02/2022   Presbycusis 09/02/2022   Dry eyes 09/02/2022   Pain management contract signed 01/07/2021   Sacroiliac joint pain 01/07/2021   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   Chronic radicular lumbar pain  11/26/2020   Lumbar spondylosis 11/26/2020   Lumbar degenerative disc disease 11/26/2020   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   Encounter for long-term opiate analgesic use 11/26/2020   Spinal stenosis, lumbar region, 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/26/2020   Chronic pain syndrome 11/26/2020   Small fiber neuropathy 11/26/2020   Piriformis syndrome of both sides 11/26/2020   Neuroforaminal stenosis of lumbar spine (L5/S1, severe bilateral) 11/26/2020   Compression fracture of T12 vertebra (HCC) 06/22/2019   GERD without esophagitis 06/13/2019    Callus of foot 10/26/2017   Great toe pain, right 10/26/2017   Hypercholesterolemia 07/20/2002   Allergic rhinitis 04/15/2001    Family History  Problem Relation Age of Onset   Bladder Cancer Mother    Transient ischemic attack Father    Arthritis/Rheumatoid Father    Breast cancer Neg Hx     Social History   Tobacco Use   Smoking status: Never    Passive exposure: Never   Smokeless tobacco: Never  Substance Use Topics   Alcohol use: Never    Allergies  Allergen Reactions   Sumatriptan Anxiety, Palpitations and Other (See Comments)    Felt weak    Penicillin G Anxiety and Other (See Comments)    Reaction unknown given a lot of it as a child. Was told not to take anymore of it Reaction unknown given a lot of it as a child. Was told not to take anymore of it Reaction unknown given a lot of it as a child. Was told not to take anymore of it Reaction unknown given a lot of it as a child. Was told not to take anymore of it    Topiramate Other (See Comments)   Ilosone [Erythromycin] Other (See Comments)    Current Meds  Medication Sig   Alpha-Lipoic Acid 600 MG CAPS Take one by mouth once daily.   atorvastatin (LIPITOR) 20 MG tablet Take by mouth daily.   buprenorphine (BUTRANS) 20 MCG/HR PTWK Place 1 patch onto the skin once a week. For chronic pain syndrome. Ok for pt to fill up to 3 days earlier than fill date.   Calcium Carbonate (CALCIUM 500 PO) Take one by mouth once daily.   Cholecalciferol (VITAMIN D3) 50 MCG (2000 UT) TABS Take one tablet by mouth daily.   Coenzyme Q10-Vitamin E (QUNOL ULTRA COQ10 PO) Take by mouth daily.   Cyanocobalamin (VITAMIN B 12 PO) Take by mouth daily.   estrogen, conjugated,-medroxyprogesterone (PREMPRO) 0.3-1.5 MG tablet Take 1 tablet by mouth daily. (Patient taking differently: Take 1 tablet by mouth daily. On Monday, Wednesday and Friday.)   lubiprostone (AMITIZA) 8 MCG capsule Take 8 mcg by mouth 2 (two) times daily as needed for  constipation.   Magnesium 200 MG CHEW Chew by mouth daily.   metoprolol tartrate (LOPRESSOR) 50 MG tablet Take 1 tablet (50 mg total) by mouth once 2 hours prior to CTA   metroNIDAZOLE (METROGEL) 1 % gel Apply topically daily.   Multiple Vitamins-Minerals (MULTIVITAMIN ADULT) CHEW Chew by mouth daily.   pantoprazole (PROTONIX) 40 MG tablet Take 1 tablet (40 mg total) by mouth daily.   PROLIA 60 MG/ML SOSY injection Inject 60 mg into the skin as needed. Twice a year.   traMADol (ULTRAM) 50 MG tablet Take 1 tablet (50 mg total) by mouth 2 (two) times daily as needed for severe pain. Ok to pick up 2-3 days prior to fill date   traMADol (ULTRAM-ER) 100 MG 24 hr tablet Take 1 tablet (100 mg total) by  mouth at bedtime. Ok for pt to fill up to 3 days earlier than fill date.    Immunization History  Administered Date(s) Administered   Fluad Quad(high Dose 65+) 02/22/2022   Influenza-Unspecified 03/27/2019, 03/25/2020   Moderna SARS-COV2 Booster Vaccination 03/01/2021, 10/21/2021   Moderna Sars-Covid-2 Vaccination 06/08/2019, 07/06/2019, 04/09/2020, 10/13/2020   Pneumococcal Conjugate,unspecified 03/08/2001, 04/23/2006   Pneumococcal Conjugate-13 02/07/2013   Pneumococcal Polysaccharide-23 04/23/2006   RSV,unspecified 04/29/2022   Tdap 12/21/2018   Zoster, Live 09/02/2005, 08/06/2017        Objective:   BP 130/84 (BP Location: Left Arm, Cuff Size: Normal)   Pulse 85   Temp 97.7 F (36.5 C)   Ht 5\' 3"  (1.6 m)   Wt 121 lb (54.9 kg)   SpO2 98%   BMI 21.43 kg/m   SpO2: 98 % O2 Device: None (Room air)  GENERAL: Thin, well-developed woman, no acute distress, fully ambulatory.  No conversational dyspnea HEAD: Normocephalic, atraumatic.  EYES: Pupils equal, round, reactive to light.  No scleral icterus.  MOUTH: Dentition intact, oral mucosa moist.  No thrush. NECK: Supple. No thyromegaly. Trachea midline. No JVD.  No adenopathy. PULMONARY: Good air entry bilaterally.  No adventitious  sounds. CARDIOVASCULAR: S1 and S2. Regular rate and rhythm.  No rubs, murmurs or gallops heard. ABDOMEN: Benign. MUSCULOSKELETAL: No joint deformity, no clubbing, no edema.  NEUROLOGIC: No overt focal deficits.  Speech is fluent. SKIN: Intact,warm,dry. PSYCH: Mood and behavior normal.  Ambulatory symmetry was performed today: At rest on room air oxygen saturation was 98%, the patient ambulated at a moderate pace, completed 3 laps, O2 nadir 94%.,  No no shortness of breath.  Resting heart rate was 86 bpm at maximum for this exercise 110 bpm.   Assessment & Plan:     ICD-10-CM   1. Shortness of breath  R06.02 Pulmonary Function Test [facility] Only    T4, free    TSH    Comp Met (CMET)    CBC w/Diff    CANCELED: CBC w/Diff    CANCELED: Comp Met (CMET)    CANCELED: TSH    CANCELED: T4, free   Suspect multifactorial Debility/deconditioning/query lung disease/anemia metabolic/medication side effect Obtain PFTs/CBC/CMP    2. Dizziness  R42 Comp Met (CMET)    CBC w/Diff    CANCELED: CBC w/Diff    CANCELED: Comp Met (CMET)   May need neurology eval Not a symptom associated with pulmonary disease per se Query medication side effect    3. Fatigue, unspecified type  R53.83 T4, free    TSH    CANCELED: TSH    CANCELED: T4, free   Check thyroid function    4. Anemia, unspecified type  D64.9    Check CBC     Orders Placed This Encounter  Procedures   T4, free    Standing Status:   Future    Number of Occurrences:   1    Standing Expiration Date:   01/07/2024   TSH    Standing Status:   Future    Number of Occurrences:   1    Standing Expiration Date:   01/07/2024   Comp Met (CMET)    Standing Status:   Future    Number of Occurrences:   1    Standing Expiration Date:   01/07/2024   CBC w/Diff    Standing Status:   Future    Number of Occurrences:   1    Standing Expiration Date:   01/07/2024  Pulmonary Function Test [facility] Only    Standing Status:   Future    Standing  Expiration Date:   01/07/2024    Order Specific Question:   Full PFT: includes the following: basic spirometry, spirometry pre & post bronchodilator, diffusion capacity (DLCO), lung volumes    Answer:   Full PFT    Order Specific Question:   MIP/MEP    Answer:   Yes    Order Specific Question:   This test can only be performed at    Answer:   [facility]   The patient's dyspnea could be multifactorial.  Will obtain pulmonary function testing, will obtain CBC, thyroid panel and complete metabolic profile.  Her issues could very well be related to metabolic impairment, anemia, and or deconditioning.  She does have evidence of perhaps mild bronchitic changes on CT and PFTs should help clarify this issue.  Consideration must be given to her chronic pain medications which are known to be potent respiratory depressants and can be causing/aggravating her symptoms.  We will see the patient in follow-up in 6 to 8 weeks time call sooner should any new problems arise.  Gailen Shelter, MD Advanced Bronchoscopy P[facility]    *This note was dictated using voice recognition software/Dragon.  Despite best efforts to proofread, errors can occur which can change the meaning. Any transcriptional errors that result from this process are unintentional and may not be fully corrected at the time of dictation.

## 2023-01-08 ENCOUNTER — Ambulatory Visit: Payer: Medicare Other | Admitting: Student

## 2023-01-15 ENCOUNTER — Other Ambulatory Visit: Payer: Self-pay

## 2023-01-15 ENCOUNTER — Encounter: Payer: Self-pay | Admitting: Student in an Organized Health Care Education/Training Program

## 2023-01-15 ENCOUNTER — Telehealth: Payer: Self-pay | Admitting: Student in an Organized Health Care Education/Training Program

## 2023-01-15 NOTE — Telephone Encounter (Signed)
Spoke with [facility] pharmacy and she is on the same page with patient.  Patient has also talked with her and reported that through My Chart.  I told Aline Brochure that I feel like we are all on the same page and that Dr Cherylann Ratel will try his best to do what is right for the patient but also right within the scope of practice.

## 2023-01-15 NOTE — Telephone Encounter (Signed)
Wants someone to call her regarding up coming medrefills and her appt for Tues. Wishes to discuss before her appt. Sent mychart message as well

## 2023-01-18 ENCOUNTER — Other Ambulatory Visit: Payer: Self-pay

## 2023-01-18 ENCOUNTER — Telehealth: Payer: Self-pay | Admitting: Student in an Organized Health Care Education/Training Program

## 2023-01-18 NOTE — Telephone Encounter (Signed)
PT called states that she was returning call from nurse on Friday. Will like for Lawson Fiscal to give her a call back please. TY

## 2023-01-18 NOTE — Telephone Encounter (Signed)
Already addressed via MyChart 

## 2023-01-19 ENCOUNTER — Ambulatory Visit
Payer: Medicare Other | Attending: Student in an Organized Health Care Education/Training Program | Admitting: Student in an Organized Health Care Education/Training Program

## 2023-01-19 ENCOUNTER — Encounter: Payer: Self-pay | Admitting: Student in an Organized Health Care Education/Training Program

## 2023-01-19 ENCOUNTER — Other Ambulatory Visit: Payer: Self-pay

## 2023-01-19 DIAGNOSIS — M48062 Spinal stenosis, lumbar region with neurogenic claudication: Secondary | ICD-10-CM | POA: Diagnosis present

## 2023-01-19 DIAGNOSIS — M47816 Spondylosis without myelopathy or radiculopathy, lumbar region: Secondary | ICD-10-CM | POA: Insufficient documentation

## 2023-01-19 DIAGNOSIS — G894 Chronic pain syndrome: Secondary | ICD-10-CM | POA: Diagnosis present

## 2023-01-19 DIAGNOSIS — Z79891 Long term (current) use of opiate analgesic: Secondary | ICD-10-CM | POA: Insufficient documentation

## 2023-01-19 MED ORDER — TRAMADOL HCL ER 100 MG PO TB24
100.0000 mg | ORAL_TABLET | Freq: Every day | ORAL | 2 refills | Status: DC
Start: 2023-01-19 — End: 2023-04-13
  Filled 2023-01-19 (×2): qty 30, 30d supply, fill #0
  Filled 2023-02-22: qty 30, 30d supply, fill #1
  Filled 2023-03-15: qty 30, 30d supply, fill #2

## 2023-01-19 MED ORDER — TRAMADOL HCL 50 MG PO TABS
50.0000 mg | ORAL_TABLET | Freq: Two times a day (BID) | ORAL | 2 refills | Status: DC | PRN
  Filled 2023-01-19 (×2): qty 60, 30d supply, fill #0
  Filled 2023-02-22: qty 60, 30d supply, fill #1
  Filled 2023-03-15: qty 60, 30d supply, fill #2

## 2023-01-19 MED ORDER — BUPRENORPHINE 20 MCG/HR TD PTWK
1.0000 | MEDICATED_PATCH | TRANSDERMAL | 2 refills | Status: DC
  Filled 2023-01-19 (×2): qty 4, 28d supply, fill #0
  Filled 2023-02-22: qty 4, 28d supply, fill #1
  Filled 2023-03-15: qty 4, 28d supply, fill #2

## 2023-01-19 NOTE — Progress Notes (Signed)
PROVIDER NOTE: Information contained herein reflects review and annotations entered in association with encounter. Interpretation of such information and data should be left to medically-trained personnel. Information provided to patient can be located elsewhere in the medical record under "Patient Instructions". Document created using STT-dictation technology, any transcriptional errors that may result from process are unintentional.    Patient: Sheila Newton  Service Category: E/M  Provider: Edward Jolly, MD  DOB: 1940-06-22  DOS: 01/19/2023  Referring Provider: [name], MD  MRN: [id number]  Specialty: Interventional Pain Management  PCP: [name], MD  Type: Established Patient  Setting: Ambulatory outpatient    Location: Office  Delivery: Face-to-face     HPI  [name], a 82 y.o. year old female, is here today because of her No primary diagnosis found.. Ms. Bohmer primary complain today is Generalized Body Aches  Pertinent problems: Ms. Byrum has Chronic radicular lumbar pain; Lumbar spondylosis; Lumbar degenerative disc disease; Pudendal neuralgia; Encounter for long-term opiate analgesic use; Spinal stenosis, lumbar region, with neurogenic claudication; Chronic pain syndrome; Small fiber neuropathy; Piriformis syndrome of both sides; Neuroforaminal stenosis of lumbar spine (L5/S1, severe bilateral); Pain management contract signed; Sacroiliac joint pain; and SI joint arthritis on their pertinent problem list. Pain Assessment: Severity of Chronic pain is reported as a 2 /10. Location: Generalized  /radiates down legs and feet. Onset: More than a month ago. Quality: Burning, Constant. Timing: Constant. Modifying factor(s): rest, meds. Vitals:  height is 5\' 3"  (1.6 m) and weight is 120 lb (54.4 kg). Her temporal temperature is 98.2 F (36.8 C). Her blood pressure is 140/88 (abnormal) and her pulse is 97. Her oxygen saturation is 100%.  BMI: Estimated body mass index is 21.26 kg/m as  calculated from the following:   Height as of this encounter: 5\' 3"  (1.6 m).   Weight as of this encounter: 120 lb (54.4 kg). Last encounter: 10/29/2022. Last procedure: 11/23/2022.  Reason for encounter: medication management.  No change in medical history since last visit.  Patient's pain is at baseline.  Patient continues multimodal pain regimen as prescribed.  States that it provides pain relief and improvement in functional status. Is leaving for a 2 week trip to see her daughters   Pharmacotherapy Assessment  Analgesic: Butrans patch 20 mcg an hour, tramadol 50 mg twice daily as needed for breakthrough pain, tramadol ER 100 mg nightly   Monitoring: [place] PMP: PDMP reviewed during this encounter.       Pharmacotherapy: No side-effects or adverse reactions reported. Compliance: No problems identified. Effectiveness: Clinically acceptable.  Florina Ou, RN  01/19/2023  9:23 AM  Sign when Signing Visit Nursing Pain Medication Assessment:  Safety precautions to be maintained throughout the outpatient stay will include: orient to surroundings, keep bed in low position, maintain call bell within reach at all times, provide assistance with transfer out of bed and ambulation.  Medication Inspection Compliance: Pill count conducted under aseptic conditions, in front of the patient. Neither the pills nor the bottle was removed from the patient's sight at any time. Once count was completed pills were immediately returned to the patient in their original bottle.  Medication #1: Buprenorphine (Suboxone) Pill/Patch Count:  1 of 4 pills remain Pill/Patch Appearance: Markings consistent with prescribed medication Bottle Appearance: Standard pharmacy container. Clearly labeled. Filled Date: 8 / 22 / 2024 Last Medication intake:  Today  Medication #2: Tramadol (Ultram) Pill/Patch Count:  36 of 60 pills remain Pill/Patch Appearance: Markings consistent with prescribed medication Bottle Appearance:  Standard pharmacy container. Clearly labeled. Filled Date: 8 / 52 / 2024 Last Medication intake:  Today  Medication #3: Tramadol (Ultram) Pill/Patch Count:  3 of 30 pills remain Pill/Patch Appearance: Markings consistent with prescribed medication Bottle Appearance: Standard pharmacy container. Clearly labeled. Filled Date: 7 / 30 / 2024 Last Medication intake:  TodaySafety precautions to be maintained throughout the outpatient stay will include: orient to surroundings, keep bed in low position, maintain call bell within reach at all times, provide assistance with transfer out of bed and ambulation.     No results found for: "CBDTHCR" No results found for: "D8THCCBX" No results found for: "D9THCCBX"  UDS:  Summary  Date Value Ref Range Status  05/12/2022 Note  Final    Comment:    ==================================================================== [facility] Select 13 (MW) ==================================================================== Test                             Result       Flag       Units  Drug Present and Declared for Prescription Verification   Buprenorphine                  35           EXPECTED   ng/mg creat   Norbuprenorphine               40           EXPECTED   ng/mg creat    Source of buprenorphine is a scheduled prescription medication.    Norbuprenorphine is an expected metabolite of buprenorphine.    Tramadol                       >3597        EXPECTED   ng/mg creat   O-Desmethyltramadol            >3597        EXPECTED   ng/mg creat   N-Desmethyltramadol            >3597        EXPECTED   ng/mg creat    Source of tramadol is a prescription medication. O-desmethyltramadol    and N-desmethyltramadol are expected metabolites of tramadol.  ==================================================================== Test                      Result    Flag   Units      Ref Range   Creatinine              139              mg/dL       >=09 ==================================================================== Declared Medications:  The flagging and interpretation on this report are based on the  following declared medications.  Unexpected results may arise from  inaccuracies in the declared medications.   **Note: The testing scope of this panel includes these medications:   Tramadol (Ultram)   **Note: The testing scope of this panel does not include small to  moderate amounts of these reported medications:   Buprenorphine Patch (BuTrans)   **Note: The testing scope of this panel does not include the  following reported medications:   Aspirin  Atorvastatin  Cyanocobalamin  Denosumab (Prolia)  Estradiol  Fluoride (Prevident)  Lubiprostone (Amitiza)  Magnesium  Multivitamin  Pantoprazole (Protonix)  Ubiquinone (CoQ10)  Vitamin E ==================================================================== For clinical consultation, please call [phone]. ====================================================================  ROS  Constitutional: Denies any fever or chills Gastrointestinal: No reported hemesis, hematochezia, vomiting, or acute GI distress Musculoskeletal: Denies any acute onset joint swelling, redness, loss of ROM, or weakness Neurological: No reported episodes of acute onset apraxia, aphasia, dysarthria, agnosia, amnesia, paralysis, loss of coordination, or loss of consciousness  Medication Review  Alpha-Lipoic Acid, Calcium Carbonate, Coenzyme Q10-Vitamin E, Cyanocobalamin, Magnesium, Multivitamin Adult, Vitamin D3, atorvastatin, buprenorphine, denosumab, estrogen (conjugated)-medroxyprogesterone, lubiprostone, metoprolol tartrate, metroNIDAZOLE, pantoprazole, and traMADol  History Review  Allergy: Ms. Sotak is allergic to sumatriptan, penicillin g, topiramate, and ilosone [erythromycin]. Drug: Ms. Zwolinski  reports no history of drug use. Alcohol:  reports no history of alcohol  use. Tobacco:  reports that she has never smoked. She has never been exposed to tobacco smoke. She has never used smokeless tobacco. Social: Ms. Narain  reports that she has never smoked. She has never been exposed to tobacco smoke. She has never used smokeless tobacco. She reports that she does not drink alcohol and does not use drugs. Medical:  has a past medical history of Small fiber polyneuropathy and Spondylolysis. Surgical: Ms. 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 Family: family history includes Arthritis/Rheumatoid in her father; Bladder Cancer in her mother; Transient ischemic attack in her father.  Laboratory Chemistry Profile   Renal Lab Results  Component Value Date   BUN 22 01/07/2023   CREATININE 1.03 (H) 01/07/2023   GFRNONAA 54 (L) 01/07/2023    Hepatic Lab Results  Component Value Date   AST 23 01/07/2023   ALT 18 01/07/2023   ALBUMIN 3.7 01/07/2023   ALKPHOS 40 01/07/2023    Electrolytes Lab Results  Component Value Date   NA 135 01/07/2023   K 5.2 (H) 01/07/2023   CL 106 01/07/2023   CALCIUM 9.1 01/07/2023    Bone No results found for: "VD25OH", "VD125OH2TOT", "RS8546EV0", "JJ0093GH8", "25OHVITD1", "25OHVITD2", "25OHVITD3", "TESTOFREE", "TESTOSTERONE"  Inflammation (CRP: Acute Phase) (ESR: Chronic Phase) No results found for: "CRP", "ESRSEDRATE", "LATICACIDVEN"       Note: Above Lab results reviewed.  Recent Imaging Review  ECHOCARDIOGRAM COMPLETE    ECHOCARDIOGRAM REPORT       Patient Name:   Jiovanna Morton  Date of Exam: 11/27/2022 Medical Rec #:  [id number]  Height:       63.0 in Accession #:    [id number] Weight:       120.0 lb Date of Birth:  Oct 17, 1940  BSA:          1.556 m Patient Age:    82 years   BP:           136/82 mmHg Patient Gender: F          HR:           90 bpm. Exam Location:  [place]  Procedure: 2D  Echo, Cardiac Doppler and Color Doppler  Indications:    R06.02 SOB; R07.9* Chest pain, unspecified   History:        Patient has no prior history of Echocardiogram examinations.                 Signs/Symptoms:Chest Pain and Shortness of Breath; Risk                 Factors:Dyslipidemia and Non-Smoker.   Sonographer:    Quentin Ore RDMS, RVT, RDCS Referring Phys: [id number] Antonieta Iba    Sonographer Comments: Technically challenging study due  to limited acoustic windows. IMPRESSIONS   1. Left ventricular ejection fraction, by estimation, is 55 to 60%. The left ventricle has normal function. The left ventricle has no regional wall motion abnormalities. There is mild left ventricular hypertrophy. Left ventricular diastolic parameters  are consistent with Grade I diastolic dysfunction (impaired relaxation).  2. Right ventricular systolic function is normal. The right ventricular size is normal.  3. The mitral valve is normal in structure. No evidence of mitral valve regurgitation.  4. The aortic valve is tricuspid. Aortic valve regurgitation is mild.  5. The inferior vena cava is normal in size with greater than 50% respiratory variability, suggesting right atrial pressure of 3 mmHg.  FINDINGS  Left Ventricle: Left ventricular ejection fraction, by estimation, is 55 to 60%. The left ventricle has normal function. The left ventricle has no regional wall motion abnormalities. The left ventricular internal cavity size was normal in size. There is  mild left ventricular hypertrophy. Left ventricular diastolic parameters are consistent with Grade I diastolic dysfunction (impaired relaxation).  Right Ventricle: The right ventricular size is normal. No increase in right ventricular wall thickness. Right ventricular systolic function is normal.  Left Atrium: Left atrial size was normal in size.  Right Atrium: Right atrial size was normal in size.  Pericardium: There is no evidence of pericardial  effusion.  Mitral Valve: The mitral valve is normal in structure. No evidence of mitral valve regurgitation.  Tricuspid Valve: The tricuspid valve is normal in structure. Tricuspid valve regurgitation is mild.  Aortic Valve: The aortic valve is tricuspid. Aortic valve regurgitation is mild. Aortic valve mean gradient measures 2.0 mmHg. Aortic valve peak gradient measures 3.9 mmHg. Aortic valve area, by VTI measures 2.60 cm.  Pulmonic Valve: The pulmonic valve was normal in structure. Pulmonic valve regurgitation is mild.  Aorta: The aortic root and ascending aorta are structurally normal, with no evidence of dilitation.  Venous: The inferior vena cava is normal in size with greater than 50% respiratory variability, suggesting right atrial pressure of 3 mmHg.  IAS/Shunts: No atrial level shunt detected by color flow Doppler.    LEFT VENTRICLE PLAX 2D LVIDd:         3.30 cm     Diastology LVIDs:         2.10 cm     LV e' medial:    5.66 cm/s LV PW:         0.90 cm     LV E/e' medial:  8.6 LV IVS:        1.50 cm     LV e' lateral:   7.40 cm/s LVOT diam:     1.90 cm     LV E/e' lateral: 6.5 LV SV:         47 LV SV Index:   30 LVOT Area:     2.84 cm   LV Volumes (MOD) LV vol d, MOD A2C: 59.6 ml LV vol d, MOD A4C: 51.4 ml LV vol s, MOD A2C: 25.5 ml LV vol s, MOD A4C: 23.7 ml LV SV MOD A2C:     34.1 ml LV SV MOD A4C:     51.4 ml LV SV MOD BP:      30.6 ml  RIGHT VENTRICLE             IVC RV Basal diam:  3.20 cm     IVC diam: 1.20 cm RV Mid diam:    2.60 cm RV S prime:     12.90  cm/s TAPSE (M-mode): 1.8 cm  LEFT ATRIUM           Index       RIGHT ATRIUM           Index LA diam:      3.50 cm 2.25 cm/m  RA Area:     14.20 cm LA Vol (A4C): 14.1 ml 9.06 ml/m  RA Volume:   33.10 ml  21.27 ml/m  AORTIC VALVE                    PULMONIC VALVE AV Area (Vmax):    2.70 cm     PV Vmax:          0.74 m/s AV Area (Vmean):   2.62 cm     PV Peak grad:     2.2 mmHg AV Area (VTI):      2.60 cm     PR End Diast Vel: 7.18 msec AV Vmax:           98.70 cm/s AV Vmean:          70.133 cm/s AV VTI:            0.182 m AV Peak Grad:      3.9 mmHg AV Mean Grad:      2.0 mmHg LVOT Vmax:         94.13 cm/s LVOT Vmean:        64.833 cm/s LVOT VTI:          0.167 m LVOT/AV VTI ratio: 0.92 AR Vena Contracta: 0.40 cm   AORTA Ao Root diam: 3.10 cm Ao Asc diam:  2.40 cm Ao Arch diam: 2.7 cm  MITRAL VALVE               TRICUSPID VALVE MV Area (PHT): 7.16 cm    TR Peak grad:   20.8 mmHg MV Decel Time: 106 msec    TR Vmax:        228.00 cm/s MV E velocity: 48.40 cm/s MV A velocity: 85.80 cm/s  SHUNTS MV E/A ratio:  0.56        Systemic VTI:  0.17 m                            Systemic Diam: 1.90 cm  Debbe Odea MD Electronically signed by Debbe Odea MD Signature Date/Time: 11/27/2022/12:51:07 PM      Final   Note: Reviewed        Physical Exam  General appearance: Well nourished, well developed, and well hydrated. In no apparent acute distress Mental status: Alert, oriented x 3 (person, place, & time)       Respiratory: No evidence of acute respiratory distress Eyes: PERLA Vitals: BP (!) 140/88   Pulse 97   Temp 98.2 F (36.8 C) (Temporal)   Ht 5\' 3"  (1.6 m)   Wt 120 lb (54.4 kg)   SpO2 100%   BMI 21.26 kg/m  BMI: Estimated body mass index is 21.26 kg/m as calculated from the following:   Height as of this encounter: 5\' 3"  (1.6 m).   Weight as of this encounter: 120 lb (54.4 kg). Ideal: Ideal body weight: 52.4 kg (115 lb 8.3 oz) Adjusted ideal body weight: 53.2 kg (117 lb 5 oz)  Assessment   Diagnosis Status  1. Encounter for long-term opiate analgesic use   2. Spinal stenosis, lumbar region, with neurogenic claudication   3. Chronic pain  syndrome   4. Lumbar spondylosis (severe, L4/5)    Controlled Controlled Controlled   Updated Problems: No problems updated.  Plan of Care  Problem-specific:  No problem-specific Assessment & Plan  notes found for this encounter.  [name] has a current medication list which includes the following long-term medication(s): atorvastatin, calcium carbonate, prempro, metoprolol tartrate, and pantoprazole.  Pharmacotherapy (Medications Ordered): Meds ordered this encounter  Medications   buprenorphine (BUTRANS) 20 MCG/HR PTWK    Sig: Place 1 patch onto the skin once a week. For chronic pain syndrome. Ok for pt to fill up to 3 days earlier than fill date.    Dispense:  4 patch    Refill:  2   traMADol (ULTRAM-ER) 100 MG 24 hr tablet    Sig: Take 1 tablet (100 mg total) by mouth at bedtime. Ok for pt to fill up to 3 days earlier than fill date.    Dispense:  30 tablet    Refill:  2   traMADol (ULTRAM) 50 MG tablet    Sig: Take 1 tablet (50 mg total) by mouth 2 (two) times daily as needed for severe pain. Ok to pick up 2-3 days prior to fill date    Dispense:  60 tablet    Refill:  2   Orders:  No orders of the defined types were placed in this encounter.  Follow-up plan:   Return in about 3 months (around 04/21/2023) for Medication Management, in person.      Bilateral SIJ/ piriformis 01/15/21       Recent Visits Date Type Provider Dept  11/23/22 Procedure visit Edward Jolly, MD [facility]  10/29/22 Office Visit Edward Jolly, MD [facility]  Showing recent visits within past 90 days and meeting all other requirements Today's Visits Date Type Provider Dept  01/19/23 Office Visit Edward Jolly, MD [facility]  Showing today's visits and meeting all other requirements Future Appointments No visits were found meeting these conditions. Showing future appointments within next 90 days and meeting all other requirements  I discussed the assessment and treatment plan with the patient. The patient was provided an opportunity to ask questions and all were answered. The patient agreed with the plan and demonstrated an understanding of the  instructions.  Patient advised to call back or seek an in-person evaluation if the symptoms or condition worsens.  Duration of encounter: .  Total time on encounter, as per AMA guidelines included both the face-to-face and non-face-to-face time personally spent by the physician and/or other qualified health care professional(s) on the day of the encounter (includes time in activities that require the physician or other qualified health care professional and does not include time in activities normally performed by clinical staff). Physician's time may include the following activities when performed: Preparing to see the patient (e.g., pre-charting review of records, searching for previously ordered imaging, lab work, and nerve conduction tests) Review of prior analgesic pharmacotherapies. Reviewing PMP Interpreting ordered tests (e.g., lab work, imaging, nerve conduction tests) Performing post-procedure evaluations, including interpretation of diagnostic procedures Obtaining and/or reviewing separately obtained history Performing a medically appropriate examination and/or evaluation Counseling and educating the patient/family/caregiver Ordering medications, tests, or procedures Referring and communicating with other health care professionals (when not separately reported) Documenting clinical information in the electronic or other health record Independently interpreting results (not separately reported) and communicating results to the patient/ family/caregiver Care coordination (not separately reported)  Note by: Edward Jolly, MD Date: 01/19/2023; Time:  9:41 AM

## 2023-01-19 NOTE — Progress Notes (Signed)
Nursing Pain Medication Assessment:  Safety precautions to be maintained throughout the outpatient stay will include: orient to surroundings, keep bed in low position, maintain call bell within reach at all times, provide assistance with transfer out of bed and ambulation.  Medication Inspection Compliance: Pill count conducted under aseptic conditions, in front of the patient. Neither the pills nor the bottle was removed from the patient's sight at any time. Once count was completed pills were immediately returned to the patient in their original bottle.  Medication #1: Buprenorphine (Suboxone) Pill/Patch Count:  1 of 4 pills remain Pill/Patch Appearance: Markings consistent with prescribed medication Bottle Appearance: Standard pharmacy container. Clearly labeled. Filled Date: 8 / 11 / 2024 Last Medication intake:  Today  Medication #2: Tramadol (Ultram) Pill/Patch Count:  36 of 60 pills remain Pill/Patch Appearance: Markings consistent with prescribed medication Bottle Appearance: Standard pharmacy container. Clearly labeled. Filled Date: 8 / 24 / 2024 Last Medication intake:  Today  Medication #3: Tramadol (Ultram) Pill/Patch Count:  3 of 30 pills remain Pill/Patch Appearance: Markings consistent with prescribed medication Bottle Appearance: Standard pharmacy container. Clearly labeled. Filled Date: 7 / 30 / 2024 Last Medication intake:  TodaySafety precautions to be maintained throughout the outpatient stay will include: orient to surroundings, keep bed in low position, maintain call bell within reach at all times, provide assistance with transfer out of bed and ambulation.

## 2023-01-21 ENCOUNTER — Other Ambulatory Visit: Payer: Self-pay

## 2023-01-22 ENCOUNTER — Other Ambulatory Visit: Payer: Self-pay

## 2023-01-26 ENCOUNTER — Encounter: Payer: Medicare Other | Admitting: Student in an Organized Health Care Education/Training Program

## 2023-02-09 ENCOUNTER — Ambulatory Visit: Payer: Medicare Other

## 2023-02-10 ENCOUNTER — Ambulatory Visit: Payer: Medicare Other | Admitting: Student

## 2023-02-11 ENCOUNTER — Telehealth: Payer: Self-pay | Admitting: Pulmonary Disease

## 2023-02-11 NOTE — Telephone Encounter (Signed)
Patient has an appt with Dr. Jayme Cloud on 02/23/23 she had her PFT scheduled on 01/1723 but called the morning of 9/17 and CXL due to being sick. She has now called back to reschedule her PFT but there are no appts available before 02/23/23. She doesn't want to wait until December to have follow up appt. Can someone see if you can find a sooner appt in October so she can have time to get PFT and then follow up

## 2023-02-12 NOTE — Telephone Encounter (Signed)
Per Synetta Fail, PFT appt is 9/26. Appt on 10/1 with Dr. Jayme Cloud does not need to be rescheduled.  Nothing further needed.

## 2023-02-18 ENCOUNTER — Ambulatory Visit: Payer: Medicare Other | Attending: Pulmonary Disease

## 2023-02-18 DIAGNOSIS — R0602 Shortness of breath: Secondary | ICD-10-CM | POA: Diagnosis present

## 2023-02-18 LAB — PULMONARY FUNCTION TEST ARMC ONLY
DL/VA % pred: 60 %
DL/VA: 2.46 ml/min/mmHg/L
DLCO unc % pred: 62 %
DLCO unc: 11.31 ml/min/mmHg
FEF 25-75 Post: 2.44 L/sec
FEF 25-75 Pre: 1.92 L/sec
FEF2575-%Change-Post: 26 %
FEF2575-%Pred-Post: 195 %
FEF2575-%Pred-Pre: 153 %
FEV1-%Change-Post: 8 %
FEV1-%Pred-Post: 123 %
FEV1-%Pred-Pre: 114 %
FEV1-Post: 2.2 L
FEV1-Pre: 2.03 L
FEV1FVC-%Change-Post: 4 %
FEV1FVC-%Pred-Pre: 107 %
FEV6-%Change-Post: 3 %
FEV6-%Pred-Post: 117 %
FEV6-%Pred-Pre: 113 %
FEV6-Post: 2.67 L
FEV6-Pre: 2.57 L
FEV6FVC-%Pred-Post: 106 %
FEV6FVC-%Pred-Pre: 106 %
FVC-%Change-Post: 3 %
FVC-%Pred-Post: 111 %
FVC-%Pred-Pre: 106 %
FVC-Post: 2.67 L
FVC-Pre: 2.57 L
Post FEV1/FVC ratio: 82 %
Post FEV6/FVC ratio: 100 %
Pre FEV1/FVC ratio: 79 %
Pre FEV6/FVC Ratio: 100 %
RV % pred: 104 %
RV: 2.49 L
TLC % pred: 108 %
TLC: 5.33 L

## 2023-02-18 MED ORDER — ALBUTEROL SULFATE (2.5 MG/3ML) 0.083% IN NEBU
2.5000 mg | INHALATION_SOLUTION | Freq: Once | RESPIRATORY_TRACT | Status: AC
  Administered 2023-02-18: 2.5 mg via RESPIRATORY_TRACT
  Filled 2023-02-18: qty 3

## 2023-02-22 ENCOUNTER — Other Ambulatory Visit: Payer: Self-pay

## 2023-02-23 ENCOUNTER — Encounter: Payer: Self-pay | Admitting: Pulmonary Disease

## 2023-02-23 ENCOUNTER — Ambulatory Visit (INDEPENDENT_AMBULATORY_CARE_PROVIDER_SITE_OTHER): Payer: Medicare Other | Admitting: Pulmonary Disease

## 2023-02-23 VITALS — BP 120/70 | HR 95 | Temp 97.6°F | Ht 63.0 in | Wt 120.2 lb

## 2023-02-23 DIAGNOSIS — R0602 Shortness of breath: Secondary | ICD-10-CM | POA: Diagnosis not present

## 2023-02-23 DIAGNOSIS — R942 Abnormal results of pulmonary function studies: Secondary | ICD-10-CM

## 2023-02-23 NOTE — Patient Instructions (Addendum)
We are getting an overnight oxygen level.  They are getting a specialized CT of the chest.  We will see her in follow-up in 4 to 6 weeks time call sooner should any new problems arise.

## 2023-02-23 NOTE — Progress Notes (Signed)
Subjective:    Patient ID: Sheila Newton, female    DOB: [birth year], 82 y.o.   MRN: [id number]  Patient Care Team: [name], MD as PCP - [facility])  Chief Complaint  Patient presents with   Follow-up    Shortness of breath on exertion and shallow breathing.     HPI Sheila Newton is an 82 year old lifelong never smoker with history as noted below who presents for follow-up of dyspnea and dizziness that have been present for "years". The patient states that she has had the symptoms for many many years.  The patient was initially seen here on 07 January 2023.  For the details of that visit please refer to that note.  The patient had pulmonary function testing performed on 26 September that showed normal spirometry and lung volumes, normal MIP and MEP for age.  There was a moderate diffusion defect.  Recall that she had a coronary CT performed on 12 November 2022 and on the lung windows that showed subsegmental atelectasis or scarring and evidence of chronic bronchitis.  Given the diffusion defect abnormality, I recommend a high-resolution chest CT and this was discussed with the patient who agrees to proceed.   Patient has been worked up by cardiology, had an echocardiogram performed on 5 July that showed normal left and right ventricular function and no significant valvular disease.  Cardiac CTA did not show any coronary occlusion of concern.  It was recommended that she engage in a regular walking program for conditioning.    The patient describes sighing respirations throughout the day.  She also describes bendopnea.  As noted some of her episodes of shortness of breath are also associated with dizziness.  We discussed that if CT chest does not show any significant explanation for her shortness of breath we will have to consider that her issues are related to medications that she takes for chronic pain namely buprenorphine and tramadol.   Review of Systems A 10 point review of systems  was performed and it is as noted above otherwise negative.   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   Encounter for long-term opiate analgesic use 11/26/2020   Spinal stenosis, lumbar region, 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/26/2020   Chronic pain syndrome 11/26/2020   Small fiber neuropathy 11/26/2020   Piriformis syndrome of both sides 11/26/2020   Neuroforaminal stenosis of lumbar spine (L5/S1, severe bilateral) 11/26/2020   Compression fracture of T12 vertebra (HCC) 06/22/2019   GERD without esophagitis 06/13/2019   Callus of foot 10/26/2017   Great toe pain, right 10/26/2017   Hypercholesterolemia 07/20/2002   Allergic rhinitis 04/15/2001    Social History   Tobacco Use   Smoking status: Never    Passive exposure: Never   Smokeless tobacco: Never  Substance Use Topics   Alcohol use: Never    Allergies  Allergen Reactions   Sumatriptan Anxiety, Palpitations and Other (See Comments)    Felt weak    Penicillin G Anxiety  and Other (See Comments)    Reaction unknown given a lot of it as a child. Was told not to take anymore of it Reaction unknown given a lot of it as a child. Was told not to take anymore of it Reaction unknown given a lot of it as a child. Was told not to take anymore of it Reaction unknown given a lot of it as a child. Was told not to take anymore of it    Topiramate Other (See Comments)   Ilosone [Erythromycin]  Other (See Comments)    Current Meds  Medication Sig   Alpha-Lipoic Acid 600 MG CAPS Take one by mouth once daily.   atorvastatin (LIPITOR) 20 MG tablet Take by mouth daily.   buprenorphine (BUTRANS) 20 MCG/HR PTWK Place 1 patch onto the skin once a week. For chronic pain syndrome. Ok for pt to fill up to 3 days earlier than fill date.   Calcium Carbonate (CALCIUM 500 PO) Take one by mouth once daily.   Cholecalciferol (VITAMIN D3) 50 MCG (2000 UT) TABS Take one tablet by mouth daily.   Coenzyme Q10-Vitamin E (QUNOL ULTRA COQ10 PO) Take by mouth daily.   Cyanocobalamin (VITAMIN B 12 PO) Take by mouth daily.   estrogen, conjugated,-medroxyprogesterone (PREMPRO) 0.3-1.5 MG tablet Take 1 tablet by mouth daily.   lubiprostone (AMITIZA) 8 MCG capsule Take 8 mcg by mouth 2 (two) times daily as needed for constipation.   Magnesium 200 MG CHEW Chew by mouth daily.   metoprolol tartrate (LOPRESSOR) 50 MG tablet Take 1 tablet (50 mg total) by mouth once 2 hours prior to CTA   metroNIDAZOLE (METROGEL) 1 % gel Apply topically daily.   Multiple Vitamins-Minerals (MULTIVITAMIN ADULT) CHEW Chew by mouth daily.   pantoprazole (PROTONIX) 40 MG tablet Take 1 tablet (40 mg total) by mouth daily.   PROLIA 60 MG/ML SOSY injection Inject 60 mg into the skin as needed. Twice a year.   traMADol (ULTRAM) 50 MG tablet Take 1 tablet (50 mg total) by mouth 2 (two) times daily as needed for severe pain. Ok to pick up 2-3 days prior to fill date   traMADol (ULTRAM-ER) 100 MG 24 hr tablet Take 1 tablet (100 mg total) by mouth at bedtime. Ok for pt to fill up to 3 days earlier than fill date.    Immunization History  Administered Date(s) Administered   Fluad Quad(high Dose 65+) 02/22/2022   Influenza-Unspecified 03/27/2019, 03/25/2020   Moderna SARS-COV2 Booster Vaccination 03/01/2021, 10/21/2021   Moderna Sars-Covid-2 Vaccination 06/08/2019, 07/06/2019, 04/09/2020, 10/13/2020   Pneumococcal Conjugate,unspecified  03/08/2001, 04/23/2006   Pneumococcal Conjugate-13 02/07/2013   Pneumococcal Polysaccharide-23 04/23/2006   RSV,unspecified 04/29/2022   Tdap 12/21/2018   Zoster, Live 09/02/2005, 08/06/2017        Objective:     BP 120/70 (BP Location: Left Arm, Patient Position: Sitting, Cuff Size: Normal)   Pulse 95   Temp 97.6 F (36.4 C) (Temporal)   Ht 5\' 3"  (1.6 m)   Wt 120 lb 3.2 oz (54.5 kg)   SpO2 99%   BMI 21.29 kg/m   SpO2: 99 %  GENERAL: Thin, well-developed woman, no acute distress, fully ambulatory.  No conversational dyspnea HEAD: Normocephalic, atraumatic.  EYES: Pupils equal, round, reactive to light.  No scleral icterus.  MOUTH: Dentition intact, oral mucosa moist.  No thrush. NECK: Supple. No thyromegaly. Trachea midline. No JVD.  No adenopathy. PULMONARY: Good air entry bilaterally.  No adventitious sounds. CARDIOVASCULAR: S1 and S2. Regular rate and  rhythm.  No rubs, murmurs or gallops heard. ABDOMEN: Benign. MUSCULOSKELETAL: No joint deformity, no clubbing, no edema.  NEUROLOGIC: No overt focal deficits.  Speech is fluent. SKIN: Intact,warm,dry. PSYCH: Mood and behavior normal.    Ambulatory oxymetry was performed today:  At rest on room air oxygen saturation was 98%, the patient ambulated at a fast pace, completed 3 laps, O2 nadir 92%, moderate shortness of breath.  Resting heart rate was 5 bpm at maximum for this exercise 119 bpm.    Assessment & Plan:     ICD-10-CM   1. Shortness of breath  R06.02 CT CHEST HIGH RESOLUTION    Overnight Pulse Oximetry Study   Proceed with high-resolution chest CT Overnight oximetry Need to continue to consider potential drug side effect (Buprenorphine/tramadol)    2. Abnormal PFTs  R94.2 CT CHEST HIGH RESOLUTION   Diffusion capacity impairment noted Will obtain high-resolution chest CT      Orders Placed This Encounter  Procedures   CT CHEST HIGH RESOLUTION    Standing Status:   Future    Standing Expiration Date:    02/23/2024    Order Specific Question:   Preferred imaging location?    Answer:   [facility]   Overnight Pulse Oximetry Study    Room Air DME: new start    Standing Status:   Future    Standing Expiration Date:   02/23/2024   Will obtain high-resolution CT chest to exclude interstitial lung disease.  She has significant diffusion capacity impairment on PFTs.  She did not exhibit significant O2 desaturations.  Will see the patient in follow-up in 4 to 6 weeks time call sooner should any new problems arise.  Gailen Shelter, MD Advanced [facility]    *This note was dictated using voice recognition software/Dragon.  Despite best efforts to proofread, errors can occur which can change the meaning. Any transcriptional errors that result from this process are unintentional and may not be fully corrected at the time of dictation.

## 2023-03-04 ENCOUNTER — Ambulatory Visit: Payer: Medicare Other

## 2023-03-05 ENCOUNTER — Ambulatory Visit
Admission: RE | Admit: 2023-03-05 | Discharge: 2023-03-05 | Disposition: A | Discharge status: Home / Self Care | Payer: Medicare Other | Source: Ambulatory Visit | Attending: Pulmonary Disease | Admitting: Pulmonary Disease

## 2023-03-05 DIAGNOSIS — R0602 Shortness of breath: Secondary | ICD-10-CM | POA: Diagnosis present

## 2023-03-05 DIAGNOSIS — R942 Abnormal results of pulmonary function studies: Secondary | ICD-10-CM | POA: Insufficient documentation

## 2023-03-09 ENCOUNTER — Telehealth: Payer: Self-pay

## 2023-03-09 NOTE — Telephone Encounter (Signed)
Patient called stating she is with body aches, headache, and bad digestion x 1 week that is progressively getting worse. Patient states the pain is in the chest/back area although she "Wouldn't really call it a pain more like discomfort., I don't believe it's covid," patient states.  Patient hoped to see Dr.Beamer tomorrow, however the schedule is booked.   Patient asked that I send a message to the [facility] providers to seek advice, recommendations and feedback.   Side Note: We do not have any openings at [facility] Adult [facility], Office  today

## 2023-03-09 NOTE — Telephone Encounter (Signed)
French Ana called to offer appt with Me or clinic appt in [facility] and she wanted to wait to see Dr Sydnee Cabal

## 2023-03-12 ENCOUNTER — Encounter: Payer: Self-pay | Admitting: Student

## 2023-03-12 ENCOUNTER — Ambulatory Visit: Payer: Medicare Other | Admitting: Student

## 2023-03-12 VITALS — BP 136/84 | HR 59 | Temp 98.4°F | Ht 63.0 in | Wt 121.0 lb

## 2023-03-12 DIAGNOSIS — K219 Gastro-esophageal reflux disease without esophagitis: Secondary | ICD-10-CM | POA: Diagnosis not present

## 2023-03-12 DIAGNOSIS — R0602 Shortness of breath: Secondary | ICD-10-CM

## 2023-03-12 MED ORDER — PANTOPRAZOLE SODIUM 40 MG PO TBEC: 40.0000 mg | DELAYED_RELEASE_TABLET | Freq: Two times a day (BID) | ORAL | 1 refills | Status: DC

## 2023-03-12 NOTE — Patient Instructions (Addendum)
Please take Protonix (Pantoprazole) 40 mg in the morning and evening.   We will follow up in 1 month to check on your symptoms.

## 2023-03-12 NOTE — Progress Notes (Unsigned)
[facility] clinic [place].   Provider: Dr. [name]  Code Status: DNR Goals of Care:     03/12/2023    3:56 PM  Advanced Directives  Does Patient Have a Medical Advance Directive? Yes  Type of Estate agent of [place];Out of facility DNR (pink MOST or yellow form);Living will  Does patient want to make changes to medical advance directive? No - Patient declined  Copy of Healthcare Power of Attorney in Chart? No - copy requested     Chief Complaint  Patient presents with  . Acute Visit    Complains of Headaches, Bodyaches, burping and gas.     HPI: Patient is a 82 y.o. Newton seen today for an acute visit for Headaches, Body Aches, Burping and Gas.   We talked about gas and since her throat surgery. She started having gas again when she started eating normally. She cut some items from her diet and was eating a lot of fruit in the summer but cut some things. It's about the same as previously, but recently worsening in the last 2-3 weeks. She has gas and burping all the time. The last week she started to get some dull pain in her upper back and bloating, burping and passing gas. Feeling tired.   2004-2005 she started to get abdominal pain and within minutes would go through her neck and face and it was painful and radiate around midsection and back. It could last up to 20-40 minutes. 3-4x per year. Since then it happens that often. "Gastric attack" stops with mylanta and it takes all of it away. There is no direct correlation with eating.   She has had headaches and no energy.   She snores at night and wears tape on er mouth because it is dry. She started doing that about 4 years ago.   Her usual pattern of bowel movements I right after breakfast. She has some fecal incontinence. Kegels exercise   Past Medical History:  Diagnosis Date  . Small fiber polyneuropathy   . Spondylolysis     Past Surgical History:  Procedure Laterality Date  . ABDOMINAL  HYSTERECTOMY    . back pain     lumbar  . RADICAL VAGINAL HYSTERECTOMY  2017  . 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 Sumatriptan Anxiety, Palpitations and Other (See Comments)    Felt weak   . Penicillin G Anxiety and Other (See Comments)    Reaction unknown given a lot of it as a child. Was told not to take anymore of it Reaction unknown given a lot of it as a child. Was told not to take anymore of it Reaction unknown given a lot of it as a child. Was told not to take anymore of it Reaction unknown given a lot of it as a child. Was told not to take anymore of it   . Topiramate Other (See Comments)  . Ilosone [Erythromycin] Other (See Comments)    Outpatient Encounter Medications as of 03/12/2023  Medication Sig  . Alpha-Lipoic Acid 600 MG CAPS Take one by mouth once daily.  Marland Kitchen atorvastatin (LIPITOR) 20 MG tablet Take by mouth daily.  . buprenorphine (BUTRANS) 20 MCG/HR PTWK Place 1 patch onto the skin once a week. For chronic pain syndrome. Ok for pt to fill up to 3 days earlier than fill date.  . Calcium Carbonate (CALCIUM 500 PO) Take one by mouth once daily.  . Cholecalciferol (VITAMIN D3)  50 MCG (2000 UT) TABS Take one tablet by mouth daily.  . Coenzyme Q10-Vitamin E (QUNOL ULTRA COQ10 PO) Take by mouth daily.  . Cyanocobalamin (VITAMIN B 12 PO) Take by mouth daily.  Marland Kitchen lubiprostone (AMITIZA) Sheila MCG capsule Take Sheila mcg by mouth 2 (two) times daily as needed for constipation.  . Magnesium 200 MG CHEW Chew by mouth daily.  . metoprolol tartrate (LOPRESSOR) 50 MG tablet Take 1 tablet (50 mg total) by mouth once 2 hours prior to CTA  . metroNIDAZOLE (METROGEL) 1 % gel Apply topically daily.  . Multiple Vitamins-Minerals (MULTIVITAMIN ADULT) CHEW Chew by mouth daily.  . pantoprazole (PROTONIX) 40 MG tablet Take 1 tablet (40 mg total) by mouth daily.  Marland Kitchen PROLIA 60 MG/ML SOSY injection Inject 60 mg into the skin as needed. Twice a year.  .  traMADol (ULTRAM) 50 MG tablet Take 1 tablet (50 mg total) by mouth 2 (two) times daily as needed for severe pain. Ok to pick up 2-3 days prior to fill date  . traMADol (ULTRAM-ER) 100 MG 24 hr tablet Take 1 tablet (100 mg total) by mouth at bedtime. Ok for pt to fill up to 3 days earlier than fill date.  . estrogen, conjugated,-medroxyprogesterone (PREMPRO) 0.3-1.5 MG tablet Take 1 tablet by mouth daily.   No facility-administered encounter medications on file as of 03/12/2023.    Review of Systems:  Review of Systems  Health Maintenance  Topic Date Due  . Zoster Vaccines- Shingrix (1 of 2) 07/23/1959  . INFLUENZA VACCINE  12/24/2022  . COVID-19 Vaccine (5 - 2023-24 season) 01/24/2023  . Medicare Annual Wellness (AWV)  11/10/2023  . DTaP/Tdap/Td (2 - Td or Tdap) 12/20/2028  . Pneumonia Vaccine 77+ Years old  Completed  . DEXA SCAN  Completed  . HPV VACCINES  Aged Out    Physical Exam: Vitals:   03/12/23 1554  BP: 136/84  Pulse: (!) 59  Temp: 98.4 F (36.9 C)  SpO2: 96%  Weight: 121 lb (54.9 kg)  Height: 5\' 3"  (1.6 m)   Body mass index is 21.43 kg/m. Physical Exam  Labs reviewed: Basic Metabolic Panel: Recent Labs    06/08/22 0000 01/07/23 1159  NA 141 135  K  --  5.2*  CL 105 106  CO2 30* 24  GLUCOSE  --  86  BUN 22* 22  CREATININE 0.9 1.03*  CALCIUM Sheila.9 9.1  TSH  --  1.326   Liver Function Tests: Recent Labs    06/08/22 0000 01/07/23 1159  AST 21 23  ALT 14 18  ALKPHOS 37 40  BILITOT  --  0.4  PROT  --  6.9  ALBUMIN 4.0 3.7   No results for input(s): "LIPASE", "AMYLASE" in the last 8760 hours. No results for input(s): "AMMONIA" in the last 8760 hours. CBC: Recent Labs    06/08/22 0000 01/07/23 1159  WBC 5.7 7.4  NEUTROABS 3,232.00 5.2  HGB 11.3* 11.6*  HCT 35* 36.7  MCV  --  91.Sheila  PLT  --  213   Lipid Panel: No results for input(s): "CHOL", "HDL", "LDLCALC", "TRIG", "CHOLHDL", "LDLDIRECT" in the last 8760 hours. No results found for:  "HGBA1C"  Procedures since last visit: No results found.  Assessment/Plan There are no diagnoses linked to this encounter.   Labs/tests ordered:  * No order type specified * Next appt:  Visit date not found

## 2023-03-15 ENCOUNTER — Other Ambulatory Visit: Payer: Self-pay

## 2023-03-18 ENCOUNTER — Other Ambulatory Visit: Payer: Self-pay

## 2023-03-19 ENCOUNTER — Other Ambulatory Visit: Payer: Self-pay

## 2023-03-22 ENCOUNTER — Other Ambulatory Visit: Payer: Self-pay

## 2023-03-22 ENCOUNTER — Other Ambulatory Visit: Payer: Self-pay | Admitting: Student

## 2023-03-22 DIAGNOSIS — R232 Flushing: Secondary | ICD-10-CM

## 2023-03-23 ENCOUNTER — Other Ambulatory Visit: Payer: Self-pay

## 2023-03-30 ENCOUNTER — Ambulatory Visit (INDEPENDENT_AMBULATORY_CARE_PROVIDER_SITE_OTHER): Payer: Medicare Other | Admitting: Pulmonary Disease

## 2023-03-30 ENCOUNTER — Encounter: Payer: Self-pay | Admitting: Pulmonary Disease

## 2023-03-30 VITALS — BP 124/80 | HR 80 | Temp 97.3°F | Ht 63.0 in | Wt 120.6 lb

## 2023-03-30 DIAGNOSIS — K3189 Other diseases of stomach and duodenum: Secondary | ICD-10-CM | POA: Diagnosis not present

## 2023-03-30 DIAGNOSIS — K219 Gastro-esophageal reflux disease without esophagitis: Secondary | ICD-10-CM | POA: Diagnosis not present

## 2023-03-30 DIAGNOSIS — R0602 Shortness of breath: Secondary | ICD-10-CM | POA: Diagnosis not present

## 2023-03-30 DIAGNOSIS — R1013 Epigastric pain: Secondary | ICD-10-CM

## 2023-03-30 MED ORDER — LEVALBUTEROL TARTRATE 45 MCG/ACT IN AERO: 2.0000 | INHALATION_SPRAY | RESPIRATORY_TRACT | 2 refills | Status: DC | PRN

## 2023-03-30 NOTE — Patient Instructions (Signed)
VISIT SUMMARY:  During today's visit, we discussed your episodes of severe pain radiating from your chest to your neck and head, your shortness of breath, and the thickening of your stomach lining noted on a recent CT scan. We also reviewed your recent flu vaccination and general health maintenance.  YOUR PLAN:  -ESOPHAGEAL SPASM: Esophageal spasm is a condition where the muscles of the esophagus contract abnormally, causing chest pain that can radiate to the neck and head. Your episodes of pain are likely triggered by acid reflux. We will refer you to gastroenterology for further evaluation and management.  -GASTRITIS: Gastritis is the inflammation of the stomach lining, which can cause thickening of the stomach wall. This was noted on your recent CT scan and is likely due to irritation. We will refer you to gastroenterology for further evaluation and management.  -SHORTNESS OF BREATH: Your shortness of breath may be related to your pain medications or acid reflux. We will continue with your pain management appointment and you will try using Albuterol as needed for episodes of shortness of breath.  -GENERAL HEALTH MAINTENANCE: You have completed your flu vaccination two weeks ago. We will follow up in three months to review your general health and any new developments.  INSTRUCTIONS:  Please follow up with gastroenterology for further evaluation of your esophageal spasm and gastritis. Continue with your pain management appointment and use Albuterol as needed for shortness of breath. We will see you again in three months for a general health review.

## 2023-03-30 NOTE — Progress Notes (Signed)
Subjective:    Patient ID: Sheila Newton, female    DOB: 01-Jul-1940, 82 y.o.   MRN: [id number]  Patient Care Team: [name], MD as PCP - General [facility])  Chief Complaint  Patient presents with   Follow-up    DOE. No cough or wheezing.     BACKGROUND/INTERVAL:Sheila Newton is an 82 year old lifelong never smoker with history as noted below who presents for follow-up of dyspnea and dizziness that have been present for "years".  Patient was last seen for this issue on 23 February 2023.  In the interim she has had high-resolution chest CT on 05 March 2023 that showed no evidence of fibrotic interstitial lung disease, mild air trapping indicative of small airways disease, mild cylindrical bronchiectasis and possible gastric wall thickening.  She has not had any worsening of her shortness of breath and "sighing" respirations since she was last seen.  HPI  Discussed the use of AI scribe software for clinical note transcription with the patient, who gave verbal consent to proceed.  History of Present Illness   The patient, on chronic pain medications, reports experiencing episodes of severe pain that radiates from the chest to the neck and head, resembling a massive headache. These episodes occur approximately three to four times a year, seemingly without any specific triggers. The patient describes the pain as escalating quickly and radiating through the chest, neck, and head. If untreated, these symptoms can persist for up to forty minutes, leaving the patient feeling drained afterwards. The patient has found relief by consuming liquid antacid during these episodes, which effectively alleviates the pain. She has had reassuring cardiac work up.   The patient also reports shortness of breath, which is suspected to be a side effect of her pain medications 9buprenorphine and tramadol). She has an upcoming appointment with pain management to address this issue. The patient has not tried an inhaler  for this symptom before but is open to trying one as therapeutic trial given evidence of mild air trapping on CT.   The patient has not experienced any fevers or chills recently. She received her flu shot approximately two weeks ago. The patient has not been evaluated by gastroenterology for assessment of stomach lining thickening, as identified in a recent CT scan. This thickening is suspected to be due to irritation of the stomach, possibly related to gastritis. The patient is aware of this and is scheduled for a gastroenterology consultation.     Review of Systems A 10 point review of systems was performed and it is as noted above otherwise negative.   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   Encounter for long-term opiate analgesic use 11/26/2020   Spinal stenosis, lumbar region, 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/26/2020   Chronic pain syndrome 11/26/2020   Small fiber neuropathy 11/26/2020   Piriformis syndrome of both sides 11/26/2020   Neuroforaminal stenosis of lumbar spine (L5/S1, severe bilateral) 11/26/2020   Compression fracture of T12 vertebra (HCC) 06/22/2019  GERD without esophagitis 06/13/2019   Callus of foot 10/26/2017   Great toe pain, right 10/26/2017   Hypercholesterolemia 07/20/2002   Allergic rhinitis 04/15/2001    Social History   Tobacco Use   Smoking status: Never    Passive exposure: Never   Smokeless tobacco: Never   Substance Use Topics   Alcohol use: Never    Allergies  Allergen Reactions   Sumatriptan Anxiety, Palpitations and Other (See Comments)    Felt weak    Penicillin G Anxiety and Other (See Comments)    Reaction unknown given a lot of it as a child. Was told not to take anymore of it Reaction unknown given a lot of it as a child. Was told not to take anymore of it Reaction unknown given a lot of it as a child. Was told not to take anymore of it Reaction unknown given a lot of it as a child. Was told not to take anymore of it    Topiramate Other (See Comments)   Ilosone [Erythromycin] Other (See Comments)    Current Meds  Medication Sig   Alpha-Lipoic Acid 600 MG CAPS Take one by mouth once daily.   atorvastatin (LIPITOR) 20 MG tablet Take by mouth daily.   buprenorphine (BUTRANS) 20 MCG/HR PTWK Place 1 patch onto the skin once a week. For chronic pain syndrome. Ok for pt to fill up to 3 days earlier than fill date.   Calcium Carbonate (CALCIUM 500 PO) Take one by mouth once daily.   Cholecalciferol (VITAMIN D3) 50 MCG (2000 UT) TABS Take one tablet by mouth daily.   Coenzyme Q10-Vitamin E (QUNOL ULTRA COQ10 PO) Take by mouth daily.   Cyanocobalamin (VITAMIN B 12 PO) Take by mouth daily.   lubiprostone (AMITIZA) 8 MCG capsule Take 8 mcg by mouth 2 (two) times daily as needed for constipation.   Magnesium 200 MG CHEW Chew by mouth daily.   metroNIDAZOLE (METROGEL) 1 % gel Apply topically daily.   Multiple Vitamins-Minerals (MULTIVITAMIN ADULT) CHEW Chew by mouth daily.   pantoprazole (PROTONIX) 40 MG tablet Take 1 tablet (40 mg total) by mouth 2 (two) times daily.   PREMPRO 0.3-1.5 MG tablet TAKE 1 TABLET BY MOUTH EVERY DAY   PROLIA 60 MG/ML SOSY injection Inject 60 mg into the skin as needed. Twice a year.   traMADol (ULTRAM) 50 MG tablet Take 1 tablet (50 mg total) by mouth 2 (two) times daily as needed for severe pain. Ok to pick up 2-3 days prior to fill date   traMADol  (ULTRAM-ER) 100 MG 24 hr tablet Take 1 tablet (100 mg total) by mouth at bedtime. Ok for pt to fill up to 3 days earlier than fill date.    Immunization History  Administered Date(s) Administered   Fluad Quad(high Dose 65+) 02/22/2022, 03/16/2023   Influenza-Unspecified 03/27/2019, 03/25/2020   Moderna SARS-COV2 Booster Vaccination 03/01/2021, 10/21/2021   Moderna Sars-Covid-2 Vaccination 06/08/2019, 07/06/2019, 04/09/2020, 10/13/2020   Pneumococcal Conjugate,unspecified 03/08/2001, 04/23/2006   Pneumococcal Conjugate-13 02/07/2013   Pneumococcal Polysaccharide-23 04/23/2006   RSV,unspecified 04/29/2022   Tdap 12/21/2018   Zoster, Live 09/02/2005, 08/06/2017        Objective:   BP 124/80 (BP Location: Right Arm, Cuff Size: Normal)   Pulse 80   Temp (!) 97.3 F (36.3 C)   Ht 5\' 3"  (1.6 m)   Wt 120 lb 9.6 oz (54.7 kg)   SpO2 96%   BMI 21.36 kg/m   SpO2: 96 % O2 Device: None (  Room air)  GENERAL: Thin, well-developed woman, no acute distress, fully ambulatory.  No conversational dyspnea HEAD: Normocephalic, atraumatic.  EYES: Pupils equal, round, reactive to light.  No scleral icterus.  MOUTH: Dentition intact, oral mucosa moist.  No thrush. NECK: Supple. No thyromegaly. Trachea midline. No JVD.  No adenopathy. PULMONARY: Good air entry bilaterally.  No adventitious sounds. CARDIOVASCULAR: S1 and S2. Regular rate and rhythm.  No rubs, murmurs or gallops heard. ABDOMEN: Benign. MUSCULOSKELETAL: No joint deformity, no clubbing, no edema.  NEUROLOGIC: No overt focal deficits.  Speech is fluent. SKIN: Intact,warm,dry. PSYCH: Mood and behavior normal.       Assessment & Plan:     ICD-10-CM   1. Shortness of breath  R06.02     2. GERD without esophagitis  K21.9 Ambulatory referral to Gastroenterology    3. Dyspepsia  R10.13 Ambulatory referral to Gastroenterology    4. Gastric wall thickening  K31.89 Ambulatory referral to Gastroenterology     Orders Placed This  Encounter  Procedures   Ambulatory referral to Gastroenterology    Referral Priority:   Routine    Referral Type:   Consultation    Referral Reason:   Specialty Services Required    Referred to Provider:   Toney Reil, MD    Number of Visits Requested:   1   Meds ordered this encounter  Medications   levalbuterol (XOPENEX HFA) 45 MCG/ACT inhaler    Sig: Inhale 2 puffs into the lungs every 4 (four) hours as needed for wheezing.    Dispense:  1 each    Refill:  2   Discussion:    Esophageal Spasm Recurrent episodes of chest pain radiating to neck and head, relieved by antacid. Likely triggered by acid reflux. -Refer to gastroenterology for further evaluation and management.  Gastritis Thickening of the stomach lining noted on CT scan, likely due to irritation. -Refer to gastroenterology for further evaluation and management.  Shortness of Breath Possibly related to pain medications and/or reflux. Minimal air trapping noted on CT scan. -Continue pain management appointment. -Trial of levalbuterol as needed for episodes of shortness of breath.  General Health Maintenance -Flu vaccination completed two weeks ago. -Follow-up in three months.      Gailen Shelter, MD [facility]-    *This note was generated using voice recognition software/Dragon and/or AI transcription program.  Despite best efforts to proofread, errors can occur which can change the meaning. Any transcriptional errors that result from this process are unintentional and may not be fully corrected at the time of dictation.

## 2023-04-09 ENCOUNTER — Ambulatory Visit: Payer: Medicare Other | Admitting: Student

## 2023-04-09 ENCOUNTER — Other Ambulatory Visit: Payer: Self-pay | Admitting: Student

## 2023-04-12 ENCOUNTER — Ambulatory Visit: Payer: Medicare Other | Admitting: Student

## 2023-04-13 ENCOUNTER — Other Ambulatory Visit: Payer: Self-pay

## 2023-04-13 ENCOUNTER — Encounter: Payer: Self-pay | Admitting: Student in an Organized Health Care Education/Training Program

## 2023-04-13 ENCOUNTER — Ambulatory Visit
Payer: Medicare Other | Attending: Student in an Organized Health Care Education/Training Program | Admitting: Student in an Organized Health Care Education/Training Program

## 2023-04-13 VITALS — BP 137/70 | HR 101 | Temp 97.2°F | Resp 16 | Ht 63.0 in | Wt 120.0 lb

## 2023-04-13 DIAGNOSIS — M47816 Spondylosis without myelopathy or radiculopathy, lumbar region: Secondary | ICD-10-CM | POA: Diagnosis not present

## 2023-04-13 DIAGNOSIS — M533 Sacrococcygeal disorders, not elsewhere classified: Secondary | ICD-10-CM | POA: Diagnosis present

## 2023-04-13 DIAGNOSIS — M48062 Spinal stenosis, lumbar region with neurogenic claudication: Secondary | ICD-10-CM | POA: Insufficient documentation

## 2023-04-13 DIAGNOSIS — M461 Sacroiliitis, not elsewhere classified: Secondary | ICD-10-CM | POA: Insufficient documentation

## 2023-04-13 DIAGNOSIS — Z79891 Long term (current) use of opiate analgesic: Secondary | ICD-10-CM | POA: Diagnosis present

## 2023-04-13 DIAGNOSIS — M48061 Spinal stenosis, lumbar region without neurogenic claudication: Secondary | ICD-10-CM | POA: Insufficient documentation

## 2023-04-13 DIAGNOSIS — G894 Chronic pain syndrome: Secondary | ICD-10-CM | POA: Insufficient documentation

## 2023-04-13 MED ORDER — TRAMADOL HCL ER 100 MG PO TB24
100.0000 mg | ORAL_TABLET | Freq: Every day | ORAL | 2 refills | Status: DC
  Filled 2023-04-13 – 2023-04-20 (×2): qty 30, 30d supply, fill #0
  Filled 2023-05-17 (×2): qty 30, 30d supply, fill #1
  Filled 2023-06-14: qty 30, 30d supply, fill #2

## 2023-04-13 MED ORDER — TRAMADOL HCL 50 MG PO TABS
50.0000 mg | ORAL_TABLET | Freq: Two times a day (BID) | ORAL | 2 refills | Status: DC | PRN
  Filled 2023-04-13 – 2023-04-20 (×2): qty 60, 30d supply, fill #0
  Filled 2023-05-17 (×2): qty 60, 30d supply, fill #1
  Filled 2023-06-14 (×2): qty 60, 30d supply, fill #2
  Filled ????-??-??: fill #1

## 2023-04-13 MED ORDER — BUPRENORPHINE 20 MCG/HR TD PTWK
1.0000 | MEDICATED_PATCH | TRANSDERMAL | 2 refills | Status: DC
  Filled 2023-04-13 – 2023-04-20 (×2): qty 4, 28d supply, fill #0
  Filled 2023-05-17: qty 4, 28d supply, fill #1
  Filled 2023-06-14: qty 4, 28d supply, fill #2

## 2023-04-13 NOTE — Progress Notes (Signed)
Nursing Pain Medication Assessment:  Safety precautions to be maintained throughout the outpatient stay will include: orient to surroundings, keep bed in low position, maintain call bell within reach at all times, provide assistance with transfer out of bed and ambulation.  Medication Inspection Compliance: Pill count conducted under aseptic conditions, in front of the patient. Neither the pills nor the bottle was removed from the patient's sight at any time. Once count was completed pills were immediately returned to the patient in their original bottle.  Medication #1: Buprenorphine (Suboxone) Pill/Patch Count:  0 of 4 pills remain Pill/Patch Appearance: Markings consistent with prescribed medication Bottle Appearance: Standard pharmacy container. Clearly labeled. Filled Date: 55 / 25 / 2024 Last Medication intake:  Today  Medication #2: Tramadol (Ultram) Pill/Patch Count:  21 of 60 pills remain Pill/Patch Appearance: Markings consistent with prescribed medication Bottle Appearance: Standard pharmacy container. Clearly labeled. Filled Date: 32 / 28 / 2024 Last Medication intake:  Today  Medication #3:  tramadol 100  Pill/Patch Count: 10 of 30 pills remain Pill/Patch Appearance: Markings consistent with prescribed medication Bottle Appearance: Standard pharmacy container. Clearly labeled. Filled Date: 62 / 28 / 2024 Last Medication intake:  Today

## 2023-04-13 NOTE — Progress Notes (Signed)
PROVIDER NOTE: Information contained herein reflects review and annotations entered in association with encounter. Interpretation of such information and data should be left to medically-trained personnel. Information provided to patient can be located elsewhere in the medical record under "Patient Instructions". Document created using STT-dictation technology, any transcriptional errors that may result from process are unintentional.    Patient: Sheila Newton  Service Category: E/M  Provider: Edward Jolly, MD  DOB: September 21, 1940  DOS: 04/13/2023  Referring Provider: [name], MD  MRN: [id number]  Specialty: Interventional Pain Management  PCP: [name], MD  Type: Established Patient  Setting: Ambulatory outpatient    Location: Office  Delivery: Face-to-face     HPI  Sheila Newton, a 82 y.o. year old female, is here today because of her Encounter for long-term opiate analgesic use [Z79.891]. Sheila Newton primary complain today is Other (Nerve pain that is everywhere )  Pertinent problems: Sheila Newton has Chronic radicular lumbar pain; Lumbar spondylosis; Lumbar degenerative disc disease; Pudendal neuralgia; Encounter for long-term opiate analgesic use; Spinal stenosis, lumbar region, with neurogenic claudication; Chronic pain syndrome; Small fiber neuropathy; Piriformis syndrome of both sides; Neuroforaminal stenosis of lumbar spine (L5/S1, severe bilateral); Pain management contract signed; Sacroiliac joint pain; and SI joint arthritis (HCC) on their pertinent problem list. Pain Assessment: Severity of Chronic pain is reported as a 2 /10. Location: Other (Comment) (generalized nerve pain) Other (Comment) (generalized)/denies. Onset: More than a month ago. Quality: Discomfort, Tingling, Burning, Stabbing, Numbness, Constant. Timing: Constant. Modifying factor(s): changing positions frequently, medications. Vitals:  height is 5\' 3"  (1.6 m) and weight is 120 lb (54.4 kg). Her temporal temperature is  97.2 F (36.2 C) (abnormal). Her blood pressure is 137/70 and her pulse is 101 (abnormal). Her respiration is 16 and oxygen saturation is 100%.  BMI: Estimated body mass index is 21.26 kg/m as calculated from the following:   Height as of this encounter: 5\' 3"  (1.6 m).   Weight as of this encounter: 120 lb (54.4 kg). Last encounter: 01/19/23 Last procedure: 11/23/2022.  Reason for encounter: medication management.  The patient has been experiencing chronic shortness of breath for several years, the cause of which remains undetermined despite multiple consultations with various specialists, including ENT, Cardiology, and Pulmonology. The most recent appointment with Pulmonology was approximately three weeks ago. The patient is scheduled for a follow-up with a Gastroenterologist later this week.  The patient's shortness of breath is severe and has been a persistent issue for many years. The patient is currently on a regimen of chronic pain medications, including a butrans patch and tramadol. The patient queried if these medications could be contributing to the shortness of breath.  The patient is also on alpha lipoic acid, which they have not been taking consistently but plan to resume. The patient's prescriptions for both the short-acting and long-acting tramadol, as well as the Butrans patch, were due for refill. The patient takes the short-acting tramadol twice daily as needed.  Pharmacotherapy Assessment  Analgesic: Butrans patch 20 mcg an hour, tramadol 50 mg twice daily as needed for breakthrough pain, tramadol ER 100 mg nightly   Monitoring: [place] PMP: PDMP reviewed during this encounter.       Pharmacotherapy: No side-effects or adverse reactions reported. Compliance: No problems identified. Effectiveness: Clinically acceptable.  [name], RN  04/13/2023  1:30 PM  Sign when Signing Visit Nursing Pain Medication Assessment:  Safety precautions to be maintained throughout the  outpatient stay will include: orient to surroundings, keep bed  in low position, maintain call bell within reach at all times, provide assistance with transfer out of bed and ambulation.  Medication Inspection Compliance: Pill count conducted under aseptic conditions, in front of the patient. Neither the pills nor the bottle was removed from the patient's sight at any time. Once count was completed pills were immediately returned to the patient in their original bottle.  Medication #1: Buprenorphine (Suboxone) Pill/Patch Count:  0 of 4 pills remain Pill/Patch Appearance: Markings consistent with prescribed medication Bottle Appearance: Standard pharmacy container. Clearly labeled. Filled Date: 51 / 25 / 2024 Last Medication intake:  Today  Medication #2: Tramadol (Ultram) Pill/Patch Count:  21 of 60 pills remain Pill/Patch Appearance: Markings consistent with prescribed medication Bottle Appearance: Standard pharmacy container. Clearly labeled. Filled Date: 78 / 28 / 2024 Last Medication intake:  Today  Medication #3:  tramadol 100  Pill/Patch Count: 10 of 30 pills remain Pill/Patch Appearance: Markings consistent with prescribed medication Bottle Appearance: Standard pharmacy container. Clearly labeled. Filled Date: 108 / 28 / 2024 Last Medication intake:  Today    No results found for: "CBDTHCR" No results found for: "D8THCCBX" No results found for: "D9THCCBX"  UDS:  Summary  Date Value Ref Range Status  05/12/2022 Note  Final    Comment:    ==================================================================== [employer] Select 13 (MW) ==================================================================== Test                             Result       Flag       Units  Drug Present and Declared for Prescription Verification   Buprenorphine                  35           EXPECTED   ng/mg creat   Norbuprenorphine               40           EXPECTED   ng/mg creat    Source of  buprenorphine is a scheduled prescription medication.    Norbuprenorphine is an expected metabolite of buprenorphine.    Tramadol                       >3597        EXPECTED   ng/mg creat   O-Desmethyltramadol            >3597        EXPECTED   ng/mg creat   N-Desmethyltramadol            >3597        EXPECTED   ng/mg creat    Source of tramadol is a prescription medication. O-desmethyltramadol    and N-desmethyltramadol are expected metabolites of tramadol.  ==================================================================== Test                      Result    Flag   Units      Ref Range   Creatinine              139              mg/dL      >=08 ==================================================================== Declared Medications:  The flagging and interpretation on this report are based on the  following declared medications.  Unexpected results may arise from  inaccuracies in the declared medications.   **Note: The testing scope of  this panel includes these medications:   Tramadol (Ultram)   **Note: The testing scope of this panel does not include small to  moderate amounts of these reported medications:   Buprenorphine Patch (BuTrans)   **Note: The testing scope of this panel does not include the  following reported medications:   Aspirin  Atorvastatin  Cyanocobalamin  Denosumab (Prolia)  Estradiol  Fluoride (Prevident)  Lubiprostone (Amitiza)  Magnesium  Multivitamin  Pantoprazole (Protonix)  Ubiquinone (CoQ10)  Vitamin E ==================================================================== For clinical consultation, please call [phone]. ====================================================================       ROS  Constitutional:  as above Gastrointestinal: No reported hemesis, hematochezia, vomiting, or acute GI distress Musculoskeletal: Denies any acute onset joint swelling, redness, loss of ROM, or weakness Neurological: No reported episodes  of acute onset apraxia, aphasia, dysarthria, agnosia, amnesia, paralysis, loss of coordination, or loss of consciousness  Medication Review  Alpha-Lipoic Acid, Calcium Carbonate, Coenzyme Q10-Vitamin E, Cyanocobalamin, Magnesium, Multivitamin Adult, Vitamin D3, atorvastatin, buprenorphine, denosumab, estrogen (conjugated)-medroxyprogesterone, levalbuterol, lubiprostone, metoprolol tartrate, metroNIDAZOLE, pantoprazole, and traMADol  History Review  Allergy: Sheila Newton is allergic to sumatriptan, penicillin g, topiramate, and ilosone [erythromycin]. Drug: Sheila Newton  reports no history of drug use. Alcohol:  reports no history of alcohol use. Tobacco:  reports that she has never smoked. She has never been exposed to tobacco smoke. She has never used smokeless tobacco. Social: Sheila Newton  reports that she has never smoked. She has never been exposed to tobacco smoke. She has never used smokeless tobacco. She reports that she does not drink alcohol and does not use drugs. Medical:  has a past medical history of Small fiber polyneuropathy and Spondylolysis. 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 Family: family history includes Arthritis/Rheumatoid in her father; Bladder Cancer in her mother; Transient ischemic attack in her father.  Laboratory Chemistry Profile   Renal Lab Results  Component Value Date   BUN 22 01/07/2023   CREATININE 1.03 (H) 01/07/2023   GFRNONAA 54 (L) 01/07/2023    Hepatic Lab Results  Component Value Date   AST 23 01/07/2023   ALT 18 01/07/2023   ALBUMIN 3.7 01/07/2023   ALKPHOS 40 01/07/2023    Electrolytes Lab Results  Component Value Date   NA 135 01/07/2023   K 5.2 (H) 01/07/2023   CL 106 01/07/2023   CALCIUM 9.1 01/07/2023    Bone No results found for: "VD25OH", "VD125OH2TOT", "UV2536UY4", "IH4742VZ5", "25OHVITD1",  "25OHVITD2", "25OHVITD3", "TESTOFREE", "TESTOSTERONE"  Inflammation (CRP: Acute Phase) (ESR: Chronic Phase) No results found for: "CRP", "ESRSEDRATE", "LATICACIDVEN"       Note: Above Lab results reviewed.  Recent Imaging Review  CT CHEST HIGH RESOLUTION CLINICAL DATA:  Shortness of breath.  EXAM: CT CHEST WITHOUT CONTRAST  TECHNIQUE: Multidetector CT imaging of the chest was performed following the standard protocol without intravenous contrast. High resolution imaging of the lungs, as well as inspiratory and expiratory imaging, was performed.  RADIATION DOSE REDUCTION: This exam was performed according to the departmental dose-optimization program which includes automated exposure control, adjustment of the mA and/or kV according to patient size and/or use of iterative reconstruction technique.  COMPARISON:  Cardiac CT 11/12/2022.  FINDINGS: Cardiovascular: Atherosclerotic calcification of the aorta and coronary arteries. Heart is at the upper limits of normal in size. Ascending aorta measures up to 3.5 cm (6/67). No pericardial effusion.  Mediastinum/Nodes: No pathologically enlarged mediastinal or axillary lymph nodes. Hilar  regions are difficult to definitively evaluate without IV contrast. Esophagus is grossly unremarkable.  Lungs/Pleura: Mild cylindrical bronchiectasis. Negative for subpleural reticulation, traction bronchiectasis/bronchiolectasis, ground glass, architectural distortion or honeycombing. Mild scarring in the right middle lobe and lingula. 3 mm polygonal subpleural nodule in the apical left upper lobe (4/15), likely scarring or a benign subpleural lymph node. No pleural fluid. Airway is unremarkable. Mild air trapping.  Upper Abdomen: Visualized portions of the liver, gallbladder, adrenal glands, kidneys, spleen and pancreas are grossly unremarkable. There may be slight gastric wall thickening. No upper abdominal adenopathy.  Musculoskeletal:  Degenerative changes in the spine. Old rib fractures. Old T7 and T12 compression fractures. Old sternal fracture.  IMPRESSION: 1. No evidence of fibrotic interstitial lung disease. Mild air trapping is indicative of small airways disease. 2. Mild cylindrical bronchiectasis. 3. Possible gastric wall thickening.  Please correlate clinically. 4. Aortic atherosclerosis (ICD10-I70.0). Coronary artery calcification.  Electronically Signed   By: Leanna Battles M.D.   On: 03/17/2023 08:28 Note: Reviewed        Physical Exam  General appearance: Well nourished, well developed, and well hydrated. In no apparent acute distress Mental status: Alert, oriented x 3 (person, place, & time)       Respiratory: No evidence of acute respiratory distress Eyes: PERLA Vitals: BP 137/70 (BP Location: Right Arm, Patient Position: Sitting, Cuff Size: Normal)   Pulse (!) 101   Temp (!) 97.2 F (36.2 C) (Temporal)   Resp 16   Ht 5\' 3"  (1.6 m)   Wt 120 lb (54.4 kg)   SpO2 100%   BMI 21.26 kg/m  BMI: Estimated body mass index is 21.26 kg/m as calculated from the following:   Height as of this encounter: 5\' 3"  (1.6 m).   Weight as of this encounter: 120 lb (54.4 kg). Ideal: Ideal body weight: 52.4 kg (115 lb 8.3 oz) Adjusted ideal body weight: 53.2 kg (117 lb 5 oz)  Assessment   Diagnosis Status  1. Encounter for long-term opiate analgesic use   2. Spinal stenosis, lumbar region, with neurogenic claudication   3. Lumbar spondylosis (severe, L4/5)   4. Neuroforaminal stenosis of lumbar spine (L5/S1, severe bilateral)   5. Sacroiliac joint pain   6. SI joint arthritis (HCC)   7. Chronic pain syndrome     Controlled Controlled Controlled    Plan of Care  Chronic Pain   She has been managing long-standing chronic pain with tramadol (short-acting and long-acting) and a Butrans patch. It is unlikely that these medications contribute to her shortness of breath, considering her long-term use and  current regimen. We discussed the potential for respiratory depression with stronger narcotics or higher doses and suggested reducing tramadol if she is concerned about symptoms. We will refill tramadol (short-acting) twice daily as needed, with the next refill on April 21, 2023, refill tramadol (long-acting) on the same date, and refill the [facility] patch on April 13, 2023. She will continue alpha lipoic acid.  Shortness of Breath   She has persistent shortness of breath, having consulted ENT, cardiology, and pulmonology with a gastroenterology follow-up scheduled this week. It is unlikely that this condition is related to her current pain medications. She will continue with the scheduled gastroenterology follow-up.  Sheila Newton has a current medication list which includes the following long-term medication(s): atorvastatin, calcium carbonate, levalbuterol, pantoprazole, prempro, metoprolol tartrate, [START ON 04/21/2023] tramadol, and [START ON 04/21/2023] tramadol.  Pharmacotherapy (Medications Ordered): Meds ordered this encounter  Medications   traMADol (  ULTRAM) 50 MG tablet    Sig: Take 1 tablet (50 mg total) by mouth 2 (two) times daily as needed for severe pain (pain score 7-10).    Dispense:  60 tablet    Refill:  2   traMADol (ULTRAM-ER) 100 MG 24 hr tablet    Sig: Take 1 tablet (100 mg total) by mouth at bedtime. Ok for pt to fill up to 3 days earlier than fill date.    Dispense:  30 tablet    Refill:  2   buprenorphine (BUTRANS) 20 MCG/HR PTWK    Sig: Place 1 patch onto the skin once a week. For chronic pain syndrome. Ok for pt to fill up to 3 days earlier than fill date.    Dispense:  4 patch    Refill:  2   Orders:  No orders of the defined types were placed in this encounter.  Follow-up plan:   Return in about 3 months (around 07/14/2023) for MM, F2F.      Bilateral SIJ/ piriformis 01/15/21       Recent Visits Date Type Provider Dept  01/19/23 Office Visit  Edward Jolly, MD [facility]  Showing recent visits within past 90 days and meeting all other requirements Today's Visits Date Type Provider Dept  04/13/23 Office Visit Edward Jolly, MD [facility]  Showing today's visits and meeting all other requirements Future Appointments No visits were found meeting these conditions. Showing future appointments within next 90 days and meeting all other requirements  I discussed the assessment and treatment plan with the patient. The patient was provided an opportunity to ask questions and all were answered. The patient agreed with the plan and demonstrated an understanding of the instructions.  Patient advised to call back or seek an in-person evaluation if the symptoms or condition worsens.  Duration of encounter: .  Total time on encounter, as per AMA guidelines included both the face-to-face and non-face-to-face time personally spent by the physician and/or other qualified health care professional(s) on the day of the encounter (includes time in activities that require the physician or other qualified health care professional and does not include time in activities normally performed by clinical staff). Physician's time may include the following activities when performed: Preparing to see the patient (e.g., pre-charting review of records, searching for previously ordered imaging, lab work, and nerve conduction tests) Review of prior analgesic pharmacotherapies. Reviewing PMP Interpreting ordered tests (e.g., lab work, imaging, nerve conduction tests) Performing post-procedure evaluations, including interpretation of diagnostic procedures Obtaining and/or reviewing separately obtained history Performing a medically appropriate examination and/or evaluation Counseling and educating the patient/family/caregiver Ordering medications, tests, or procedures Referring and communicating with other health care professionals (when  not separately reported) Documenting clinical information in the electronic or other health record Independently interpreting results (not separately reported) and communicating results to the patient/ family/caregiver Care coordination (not separately reported)  Note by: Edward Jolly, MD Date: 04/13/2023; Time: 1:48 PM

## 2023-04-14 ENCOUNTER — Encounter: Payer: Self-pay | Admitting: Gastroenterology

## 2023-04-14 ENCOUNTER — Other Ambulatory Visit: Payer: Self-pay

## 2023-04-14 ENCOUNTER — Ambulatory Visit (INDEPENDENT_AMBULATORY_CARE_PROVIDER_SITE_OTHER): Payer: Medicare Other | Admitting: Gastroenterology

## 2023-04-14 VITALS — BP 127/79 | HR 102 | Temp 98.4°F | Ht 63.0 in | Wt 122.0 lb

## 2023-04-14 DIAGNOSIS — R933 Abnormal findings on diagnostic imaging of other parts of digestive tract: Secondary | ICD-10-CM

## 2023-04-14 DIAGNOSIS — R142 Eructation: Secondary | ICD-10-CM

## 2023-04-14 DIAGNOSIS — K3189 Other diseases of stomach and duodenum: Secondary | ICD-10-CM

## 2023-04-14 DIAGNOSIS — K5909 Other constipation: Secondary | ICD-10-CM

## 2023-04-14 DIAGNOSIS — K5904 Chronic idiopathic constipation: Secondary | ICD-10-CM

## 2023-04-14 NOTE — Progress Notes (Signed)
Arlyss Repress, MD [address]  [address]  [place], [place] [address]  Main: [phone]  Fax: [phone]    Gastroenterology Consultation  Referring Provider:     [name], MD Primary Care Physician:  [name], MD Primary Gastroenterologist:  Dr. Arlyss Repress Reason for Consultation: Gastric wall thickening on CT, chronic constipation        HPI:   Sheila Newton is a 82 y.o. female referred by Dr. [name], MD  for consultation & management of gastric wall thickening on CT scan.  Patient states that she has been experiencing frequent burping/belching episodes as well as increased flatulence along with constipation.  She is taking Amitiza 8 mcg twice daily.  She is also on oral calcium daily for osteoporosis along with Prolia as recommended by her endocrinologist.  She denies any burning in the chest.  She was evaluated by pulmonary for shortness of breath, underwent CT chest, no evidence of interstitial lung disease, found to have gastric wall thickening, therefore referred to GI for further treatment.  She denies any black stools, nausea or vomiting or abdominal pain.  For frequent belching/burping episodes, she was started on pantoprazole 40 mg by her PCP about a month ago with not much relief.  NSAIDs: None  Antiplts/Anticoagulants/Anti thrombotics: None  GI Procedures: Colonoscopy more than 10 years ago  Past Medical History:  Diagnosis Date   Small fiber polyneuropathy    Spondylolysis     Past Surgical History:  Procedure Laterality Date   ABDOMINAL HYSTERECTOMY     back pain     lumbar   RADICAL VAGINAL HYSTERECTOMY  2017   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     Current Outpatient Medications:    Alpha-Lipoic Acid 600 MG CAPS, Take one by mouth once daily., Disp: , Rfl:    atorvastatin (LIPITOR) 20 MG tablet, TAKE 1 TABLET BY MOUTH EVERY DAY, Disp: 90 tablet, Rfl: 3   buprenorphine (BUTRANS) 20 MCG/HR PTWK, Place 1 patch  onto the skin once a week. For chronic pain syndrome. Ok for pt to fill up to 3 days earlier than fill date., Disp: 4 patch, Rfl: 2   Calcium Carbonate (CALCIUM 500 PO), Take one by mouth once daily., Disp: , Rfl:    Cholecalciferol (VITAMIN D3) 50 MCG (2000 UT) TABS, Take one tablet by mouth daily., Disp: , Rfl:    Coenzyme Q10-Vitamin E (QUNOL ULTRA COQ10 PO), Take by mouth daily., Disp: , Rfl:    Cyanocobalamin (VITAMIN B 12 PO), Take by mouth daily., Disp: , Rfl:    levalbuterol (XOPENEX HFA) 45 MCG/ACT inhaler, Inhale 2 puffs into the lungs every 4 (four) hours as needed for wheezing., Disp: 1 each, Rfl: 2   lubiprostone (AMITIZA) 8 MCG capsule, Take 8 mcg by mouth 2 (two) times daily as needed for constipation., Disp: , Rfl:    Magnesium 200 MG CHEW, Chew by mouth daily., Disp: , Rfl:    metroNIDAZOLE (METROGEL) 1 % gel, Apply topically daily., Disp: 45 g, Rfl: 0   Multiple Vitamins-Minerals (MULTIVITAMIN ADULT) CHEW, Chew by mouth daily., Disp: , Rfl:    pantoprazole (PROTONIX) 40 MG tablet, Take 1 tablet (40 mg total) by mouth 2 (two) times daily., Disp: 180 tablet, Rfl: 1   PREMPRO 0.3-1.5 MG tablet, TAKE 1 TABLET BY MOUTH EVERY DAY, Disp: 84 tablet, Rfl: 1   PROLIA 60 MG/ML SOSY injection, Inject 60 mg into the skin as needed. Twice a year., Disp: , Rfl:    [  START ON 04/21/2023] traMADol (ULTRAM) 50 MG tablet, Take 1 tablet (50 mg total) by mouth 2 (two) times daily as needed for severe pain (pain score 7-10)., Disp: 60 tablet, Rfl: 2   [START ON 04/21/2023] traMADol (ULTRAM-ER) 100 MG 24 hr tablet, Take 1 tablet (100 mg total) by mouth at bedtime. Ok for pt to fill up to 3 days earlier than fill date., Disp: 30 tablet, Rfl: 2   Family History  Problem Relation Age of Onset   Bladder Cancer Mother    Transient ischemic attack Father    Arthritis/Rheumatoid Father    Breast cancer Neg Hx      Social History   Tobacco Use   Smoking status: Never    Passive exposure: Never    Smokeless tobacco: Never  Vaping Use   Vaping status: Never Used  Substance Use Topics   Alcohol use: Never   Drug use: Never    Allergies as of 04/14/2023 - Review Complete 04/14/2023  Allergen Reaction Noted   Sumatriptan Anxiety, Palpitations, and Other (See Comments) 09/28/2007   Penicillin g Anxiety and Other (See Comments) 09/28/2007   Topiramate Other (See Comments) 11/28/2020   Ilosone [erythromycin] Other (See Comments) 11/26/2020    Review of Systems:    All systems reviewed and negative except where noted in HPI.   Physical Exam:  BP 127/79 (BP Location: Left Arm, Patient Position: Sitting, Cuff Size: Normal)   Pulse (!) 102   Temp 98.4 F (36.9 C) (Oral)   Ht 5\' 3"  (1.6 m)   Wt 122 lb (55.3 kg)   BMI 21.61 kg/m  No LMP recorded. Patient is postmenopausal.  General:   Alert,  Well-developed, well-nourished, pleasant and cooperative in NAD Head:  Normocephalic and atraumatic. Eyes:  Sclera clear, no icterus.   Conjunctiva pink. Ears:  Normal auditory acuity. Nose:  No deformity, discharge, or lesions. Mouth:  No deformity or lesions,oropharynx pink & moist. Neck:  Supple; no masses or thyromegaly. Lungs:  Respirations even and unlabored.  Clear throughout to auscultation.   No wheezes, crackles, or rhonchi. No acute distress. Heart:  Regular rate and rhythm; no murmurs, clicks, rubs, or gallops. Abdomen:  Normal bowel sounds. Soft, non-tender and non-distended without masses, hepatosplenomegaly or hernias noted.  No guarding or rebound tenderness.   Rectal: Not performed Msk:  Symmetrical without gross deformities. Good, equal movement & strength bilaterally. Pulses:  Normal pulses noted. Extremities:  No clubbing or edema.  No cyanosis. Neurologic:  Alert and oriented x3;  grossly normal neurologically. Skin:  Intact without significant lesions or rashes. No jaundice. Psych:  Alert and cooperative. Normal mood and affect.  Imaging  Studies: Reviewed  Assessment and Plan:   Sheila Newton is a 82 y.o. female with history of osteoporosis, is seen in consultation for chronic symptoms of frequent burping/belching, CT chest revealed gastric wall thickening next  Frequent burping/belching Decrease Protonix to 40 mg once a day before meals Differentials include aerophagia or hiatal hernia or H. pylori infection Discussed about upper endoscopy for further evaluation patient is agreeable  Chronic constipation Discussed about high-fiber diet, adequate intake of water She has constipation Continue Amitiza 8 mcg twice daily Discussed about Kegel exercises patient thinks she has decreased anal sphincter tone information provided  I have discussed alternative options, risks & benefits,  which include, but are not limited to, bleeding, infection, perforation,respiratory complication & drug reaction.  The patient agrees with this plan & written consent will be obtained.    Follow  up based on EGD results   Arlyss Repress, MD

## 2023-04-20 ENCOUNTER — Encounter: Payer: Self-pay | Admitting: Gastroenterology

## 2023-04-20 ENCOUNTER — Other Ambulatory Visit: Payer: Self-pay

## 2023-04-21 ENCOUNTER — Encounter: Payer: Self-pay | Admitting: Student

## 2023-04-21 ENCOUNTER — Ambulatory Visit: Payer: Medicare Other | Admitting: Student

## 2023-04-21 VITALS — BP 136/80 | HR 96 | Temp 97.9°F | Resp 17 | Ht 63.0 in | Wt 121.0 lb

## 2023-04-21 DIAGNOSIS — L719 Rosacea, unspecified: Secondary | ICD-10-CM | POA: Diagnosis not present

## 2023-04-21 DIAGNOSIS — R0602 Shortness of breath: Secondary | ICD-10-CM

## 2023-04-21 DIAGNOSIS — K219 Gastro-esophageal reflux disease without esophagitis: Secondary | ICD-10-CM

## 2023-04-21 DIAGNOSIS — K5903 Drug induced constipation: Secondary | ICD-10-CM | POA: Diagnosis not present

## 2023-04-21 NOTE — Patient Instructions (Addendum)
Please continue drinking prune juice as tolerated.   Increase Amitiza dosing to 2 tablets in the morning and at night.   Consider using one of the three products: Urea 20% - can apply nightly Amlactin - lactic acid lotion can apply nightly.  Hyaluronic acid drops - Khiel's or Ordinary brand are found at [employer] or [employer]-- this can be applied to the feet nightly and then sleep in socks.   Consider applying hyaluronic acid on your face as well.

## 2023-04-21 NOTE — Progress Notes (Signed)
Location:   [facility]   Place of Service:   [facility]  Provider: Sydnee Cabal  Code Status:  Goals of Care:     04/21/2023    1:35 PM  Advanced Directives  Does Patient Have a Medical Advance Directive? Yes  Type of Estate agent of [place];Living will  Does patient want to make changes to medical advance directive? No - Patient declined  Copy of Healthcare Power of Attorney in Chart? No - copy requested     Chief Complaint  Patient presents with   Follow-up    1 month follow up and discuss shingles and covid vaccines.    HPI: Patient is a 82 y.o. female seen today for medical management of chronic diseases.    She had pulmonology and CT scan which showed some stomach thickening, sent her to GI.   Has an appointment with Dr. Allegra Lai. Has an appointment for endoscopy next week.   She has issues with burping and constipation. She has had constipation her entire life. Both of her parents have had constipation. Yesterday she had a good bowel movement. She takes Amitiza. She has been taking 8 mg BID She wonders if she should go up on the dose. Prune Juice usually helsp as well.   Question about gout - she has a callus on her right great toe and gets it shaved down at times with pedicurist.   Roseacea - uses metro gel and avoids the sun.    Past Medical History:  Diagnosis Date   Small fiber polyneuropathy    Spondylolysis     Past Surgical History:  Procedure Laterality Date   ABDOMINAL HYSTERECTOMY     back pain     lumbar   RADICAL VAGINAL HYSTERECTOMY  2017   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 Was told not to take anymore of it Reaction unknown given a lot of it as a child. Was told not to take anymore of it Reaction unknown  given a lot of it as a child. Was told not to take anymore of it Reaction unknown given a lot of it as a child. Was told not to take anymore of it    Topiramate Other (See Comments)   Ilosone [Erythromycin] Other (See Comments)    Outpatient Encounter Medications as of 04/21/2023  Medication Sig   Alpha-Lipoic Acid 600 MG CAPS Take one by mouth once daily.   atorvastatin (LIPITOR) 20 MG tablet TAKE 1 TABLET BY MOUTH EVERY DAY   buprenorphine (BUTRANS) 20 MCG/HR PTWK Place 1 patch onto the skin once a week. For chronic pain syndrome. Ok for pt to fill up to 3 days earlier than fill date.   Calcium Carbonate (CALCIUM 500 PO) Take one by mouth once daily.   Cholecalciferol (VITAMIN D3) 50 MCG (2000 UT) TABS Take one tablet by mouth daily.   Coenzyme Q10-Vitamin E (QUNOL ULTRA COQ10 PO) Take by mouth daily.   Cyanocobalamin (VITAMIN B 12 PO) Take by mouth daily.   lubiprostone (AMITIZA) 8 MCG capsule Take 8 mcg by mouth 2 (two) times daily as needed for constipation.   Magnesium 200 MG CHEW Chew by mouth daily.   metroNIDAZOLE (METROGEL) 1 % gel Apply topically daily.  Multiple Vitamins-Minerals (MULTIVITAMIN ADULT) CHEW Chew by mouth daily.   pantoprazole (PROTONIX) 40 MG tablet Take 1 tablet (40 mg total) by mouth 2 (two) times daily.   PREMPRO 0.3-1.5 MG tablet TAKE 1 TABLET BY MOUTH EVERY DAY   PROLIA 60 MG/ML SOSY injection Inject 60 mg into the skin as needed. Twice a year.   traMADol (ULTRAM) 50 MG tablet Take 1 tablet (50 mg total) by mouth 2 (two) times daily as needed for severe pain (pain score 7-10).   traMADol (ULTRAM-ER) 100 MG 24 hr tablet Take 1 tablet (100 mg total) by mouth at bedtime. Ok for pt to fill up to 3 days earlier than fill date.   levalbuterol (XOPENEX HFA) 45 MCG/ACT inhaler Inhale 2 puffs into the lungs every 4 (four) hours as needed for wheezing. (Patient not taking: Reported on 04/21/2023)   No facility-administered encounter medications on file as of  04/21/2023.    Review of Systems:  Review of Systems  Health Maintenance  Topic Date Due   Zoster Vaccines- Shingrix (1 of 2) 07/23/1959   COVID-19 Vaccine (5 - 2023-24 season) 01/24/2023   Medicare Annual Wellness (AWV)  11/10/2023   DTaP/Tdap/Td (2 - Td or Tdap) 12/20/2028   Pneumonia Vaccine 55+ Years old  Completed   INFLUENZA VACCINE  Completed   DEXA SCAN  Completed   HPV VACCINES  Aged Out    Physical Exam: Vitals:   04/21/23 1333  BP: 136/80  Pulse: 96  Resp: 17  Temp: 97.9 F (36.6 C)  SpO2: 96%  Weight: 121 lb (54.9 kg)  Height: 5\' 3"  (1.6 m)   Body mass index is 21.43 kg/m. Physical Exam Constitutional:      Appearance: Normal appearance.  Cardiovascular:     Rate and Rhythm: Normal rate and regular rhythm.     Pulses: Normal pulses.     Heart sounds: Normal heart sounds.  Pulmonary:     Effort: Pulmonary effort is normal.  Abdominal:     General: Abdomen is flat. Bowel sounds are normal.     Palpations: Abdomen is soft.  Musculoskeletal:        General: No swelling or tenderness.  Skin:    General: Skin is warm and dry.  Neurological:     Mental Status: She is alert and oriented to person, place, and time.     Gait: Gait normal.  Psychiatric:        Mood and Affect: Mood normal.     Labs reviewed: Basic Metabolic Panel: Recent Labs    06/08/22 0000 01/07/23 1159  NA 141 135  K  --  5.2*  CL 105 106  CO2 30* 24  GLUCOSE  --  86  BUN 22* 22  CREATININE 0.9 1.03*  CALCIUM 8.9 9.1  TSH  --  1.326   Liver Function Tests: Recent Labs    06/08/22 0000 01/07/23 1159  AST 21 23  ALT 14 18  ALKPHOS 37 40  BILITOT  --  0.4  PROT  --  6.9  ALBUMIN 4.0 3.7   No results for input(s): "LIPASE", "AMYLASE" in the last 8760 hours. No results for input(s): "AMMONIA" in the last 8760 hours. CBC: Recent Labs    06/08/22 0000 01/07/23 1159  WBC 5.7 7.4  NEUTROABS 3,232.00 5.2  HGB 11.3* 11.6*  HCT 35* 36.7  MCV  --  91.8  PLT  --   213   Lipid Panel: No results for input(s): "CHOL", "HDL", "LDLCALC", "TRIG", "  CHOLHDL", "LDLDIRECT" in the last 8760 hours. No results found for: "HGBA1C"  Procedures since last visit: No results found.  Assessment/Plan GERD without esophagitis  Shortness of breath  Rosacea  Drug-induced constipation Patient followed by GI, scheduled endoscopy next week. Hold supplements and PPI for procedure.  Pulmonology following without changes to management at this time. Constipation worse, increase Amitiza to 16 mg BID. F/u 4-6 weeks.   Instructions: Please continue drinking prune juice as tolerated.   Increase Amitiza dosing to 2 tablets in the morning and at night.   Consider using one of the three products: Urea 20% - can apply nightly Amlactin - lactic acid lotion can apply nightly.  Hyaluronic acid drops - Khiel's or Ordinary brand are found at [employer] or [employer]-- this can be applied to the feet nightly and then sleep in socks.   Consider applying hyaluronic acid on your face as well.  Labs/tests ordered:  * No order type specified * Next appt:  Visit date not found

## 2023-04-28 ENCOUNTER — Encounter: Payer: Self-pay | Admitting: Gastroenterology

## 2023-04-29 ENCOUNTER — Encounter: Payer: Self-pay | Admitting: Gastroenterology

## 2023-04-29 ENCOUNTER — Ambulatory Visit: Payer: Medicare Other | Admitting: Anesthesiology

## 2023-04-29 ENCOUNTER — Ambulatory Visit
Admission: RE | Admit: 2023-04-29 | Discharge: 2023-04-29 | Disposition: A | Discharge status: Home / Self Care | Payer: Medicare Other | Attending: Gastroenterology | Admitting: Gastroenterology

## 2023-04-29 ENCOUNTER — Other Ambulatory Visit: Payer: Self-pay

## 2023-04-29 ENCOUNTER — Encounter
Admission: RE | Disposition: A | Discharge status: Home / Self Care | Payer: Self-pay | Source: Home / Self Care | Attending: Gastroenterology

## 2023-04-29 DIAGNOSIS — K449 Diaphragmatic hernia without obstruction or gangrene: Secondary | ICD-10-CM | POA: Insufficient documentation

## 2023-04-29 DIAGNOSIS — K3189 Other diseases of stomach and duodenum: Secondary | ICD-10-CM

## 2023-04-29 DIAGNOSIS — K317 Polyp of stomach and duodenum: Secondary | ICD-10-CM | POA: Insufficient documentation

## 2023-04-29 DIAGNOSIS — R933 Abnormal findings on diagnostic imaging of other parts of digestive tract: Secondary | ICD-10-CM | POA: Diagnosis present

## 2023-04-29 DIAGNOSIS — K2950 Unspecified chronic gastritis without bleeding: Secondary | ICD-10-CM

## 2023-04-29 DIAGNOSIS — R142 Eructation: Secondary | ICD-10-CM

## 2023-04-29 DIAGNOSIS — K319 Disease of stomach and duodenum, unspecified: Secondary | ICD-10-CM

## 2023-04-29 DIAGNOSIS — K219 Gastro-esophageal reflux disease without esophagitis: Secondary | ICD-10-CM | POA: Insufficient documentation

## 2023-04-29 HISTORY — PX: BIOPSY: SHX5522

## 2023-04-29 HISTORY — PX: ESOPHAGOGASTRODUODENOSCOPY (EGD) WITH PROPOFOL: SHX5813

## 2023-04-29 SURGERY — ESOPHAGOGASTRODUODENOSCOPY (EGD) WITH PROPOFOL
Anesthesia: General

## 2023-04-29 MED ORDER — PROPOFOL 10 MG/ML IV BOLUS
INTRAVENOUS | Status: AC
  Filled 2023-04-29: qty 20

## 2023-04-29 MED ORDER — PROPOFOL 10 MG/ML IV BOLUS
INTRAVENOUS | Status: AC
  Filled 2023-04-29: qty 40

## 2023-04-29 MED ORDER — SODIUM CHLORIDE 0.9 % IV SOLN
INTRAVENOUS | Status: DC
Start: 2023-04-29 — End: 2023-04-29

## 2023-04-29 MED ORDER — PROPOFOL 500 MG/50ML IV EMUL
INTRAVENOUS | Status: DC | PRN
  Administered 2023-04-29: 120 ug/kg/min via INTRAVENOUS

## 2023-04-29 MED ORDER — LIDOCAINE HCL (CARDIAC) PF 100 MG/5ML IV SOSY
PREFILLED_SYRINGE | INTRAVENOUS | Status: DC | PRN
  Administered 2023-04-29: 50 mg via INTRAVENOUS

## 2023-04-29 MED ORDER — PROPOFOL 10 MG/ML IV BOLUS
INTRAVENOUS | Status: DC | PRN
  Administered 2023-04-29: 50 mg via INTRAVENOUS

## 2023-04-29 NOTE — H&P (Signed)
Arlyss Repress, MD [address]  [address]  [place], [place] [address]  Main: [phone]  Fax: [phone] Pager: [phone]  Primary Care Physician:  [name], MD Primary Gastroenterologist:  Dr. Arlyss Repress  Pre-Procedure History & Physical: HPI:  Sheila Newton is a 82 y.o. female is here for an endoscopy.   Past Medical History:  Diagnosis Date   Small fiber polyneuropathy    Spondylolysis     Past Surgical History:  Procedure Laterality Date   ABDOMINAL HYSTERECTOMY     back pain     lumbar   COLONOSCOPY     RADICAL VAGINAL HYSTERECTOMY  2017   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    Prior to Admission medications   Medication Sig Start Date End Date Taking? Authorizing Provider  pantoprazole (PROTONIX) 40 MG tablet Take 1 tablet (40 mg total) by mouth 2 (two) times daily. 03/12/23  Yes [name], MD  traMADol (ULTRAM) 50 MG tablet Take 1 tablet (50 mg total) by mouth 2 (two) times daily as needed for severe pain (pain score 7-10). 04/21/23 07/20/23 Yes Edward Jolly, MD  Alpha-Lipoic Acid 600 MG CAPS Take one by mouth once daily.    [provider]  atorvastatin (LIPITOR) 20 MG tablet TAKE 1 TABLET BY MOUTH EVERY DAY 04/09/23   [name], MD  buprenorphine (BUTRANS) 20 MCG/HR PTWK Place 1 patch onto the skin once a week. For chronic pain syndrome. Ok for pt to fill up to 3 days earlier than fill date. 04/13/23 07/06/23  Edward Jolly, MD  Calcium Carbonate (CALCIUM 500 PO) Take one by mouth once daily.    [provider]  Cholecalciferol (VITAMIN D3) 50 MCG (2000 UT) TABS Take one tablet by mouth daily.    [provider]  Coenzyme Q10-Vitamin E (QUNOL ULTRA COQ10 PO) Take by mouth daily.    [provider]  Cyanocobalamin (VITAMIN B 12 PO) Take by mouth daily.    [provider]  levalbuterol Pauline Aus HFA) 45 MCG/ACT inhaler Inhale 2 puffs into the lungs every 4 (four) hours as needed for  wheezing. 03/30/23 03/29/24  Salena Saner, MD  lubiprostone (AMITIZA) 8 MCG capsule Take 16 mcg by mouth 2 (two) times daily as needed for constipation.    [provider]  Magnesium 200 MG CHEW Chew by mouth daily.    [provider]  metroNIDAZOLE (METROGEL) 1 % gel Apply topically daily. 12/03/22   [name], MD  Multiple Vitamins-Minerals (MULTIVITAMIN ADULT) CHEW Chew by mouth daily.    [provider]  PREMPRO 0.3-1.5 MG tablet TAKE 1 TABLET BY MOUTH EVERY DAY 03/22/23   [name], MD  PROLIA 60 MG/ML SOSY injection Inject 60 mg into the skin as needed. Twice a year. 05/07/21   [provider]  traMADol (ULTRAM-ER) 100 MG 24 hr tablet Take 1 tablet (100 mg total) by mouth at bedtime. Ok for pt to fill up to 3 days earlier than fill date. 04/21/23 07/20/23  Edward Jolly, MD    Allergies as of 04/14/2023 - Review Complete 04/14/2023  Allergen Reaction Noted   Sumatriptan Anxiety, Palpitations, and Other (See Comments) 09/28/2007   Penicillin g Anxiety and Other (See Comments) 09/28/2007   Topiramate Other (See Comments) 11/28/2020   Ilosone [erythromycin] Other (See Comments) 11/26/2020    Family History  Problem Relation Age of Onset   Bladder Cancer Mother    Transient ischemic attack Father    Arthritis/Rheumatoid Father    Breast  cancer Neg Hx     Social History   Socioeconomic History   Marital status: Married    Spouse name: Not on file   Number of children: Not on file   Years of education: Not on file   Highest education level: Bachelor's degree (e.g., BA, AB, BS)  Occupational History   Not on file  Tobacco Use   Smoking status: Never    Passive exposure: Never   Smokeless tobacco: Never  Vaping Use   Vaping status: Never Used  Substance and Sexual Activity   Alcohol use: Never   Drug use: Never   Sexual activity: Not on file  Other Topics Concern   Not on file  Social History Narrative   Not on file    Social Determinants of Health   Financial Resource Strain: Low Risk  (09/28/2022)   Overall Financial Resource Strain (CARDIA)    Difficulty of Paying Living Expenses: Not hard at all  Food Insecurity: No Food Insecurity (09/28/2022)   Hunger Vital Sign    Worried About Running Out of Food in the Last Year: Never true    Ran Out of Food in the Last Year: Never true  Transportation Needs: No Transportation Needs (09/28/2022)   PRAPARE - Administrator, Civil Service (Medical): No    Lack of Transportation (Non-Medical): No  Physical Activity: Insufficiently Active (09/28/2022)   Exercise Vital Sign    Days of Exercise per Week: 3 days    Minutes of Exercise per Session: 30 min  Stress: No Stress Concern Present (09/28/2022)   [employer] of Occupational Health - Occupational Stress Questionnaire    Feeling of Stress : Not at all  Social Connections: Socially Integrated (09/28/2022)   Social Connection and Isolation Panel [NHANES]    Frequency of Communication with Friends and Family: More than three times a week    Frequency of Social Gatherings with Friends and Family: Twice a week    Attends Religious Services: 1 to 4 times per year    Active Member of [employer] or Organizations: Yes    Attends Banker Meetings: Not on file    Marital Status: Married  Intimate Partner Violence: Unknown (08/29/2021)   Received from [employer], [facility]   HITS    Physically Hurt: Not on file    Insult or Talk Down To: Not on file    Threaten Physical Harm: Not on file    Scream or Curse: Not on file    Review of Systems: See HPI, otherwise negative ROS  Physical Exam: BP (!) 172/77   Pulse 89   Temp 97.6 F (36.4 C) (Temporal)   Resp 16   Ht 5\' 3"  (1.6 m)   Wt 54.4 kg   SpO2 94%   BMI 21.26 kg/m  General:   Alert,  pleasant and cooperative in NAD Head:  Normocephalic and atraumatic. Neck:  Supple; no masses or thyromegaly. Lungs:  Clear throughout to  auscultation.    Heart:  Regular rate and rhythm. Abdomen:  Soft, nontender and nondistended. Normal bowel sounds, without guarding, and without rebound.   Neurologic:  Alert and  oriented x4;  grossly normal neurologically.  Impression/Plan: Sheila Newton is here for an endoscopy to be performed for Gastric wall thickening on CT , frequent burping/belching   Risks, benefits, limitations, and alternatives regarding  endoscopy have been reviewed with the patient.  Questions have been answered.  All parties agreeable.   Lannette Donath, MD  04/29/2023, 12:36 PM

## 2023-04-29 NOTE — Transfer of Care (Signed)
Immediate Anesthesia Transfer of Care Note  Patient: Sheila Newton  Procedure(s) Performed: ESOPHAGOGASTRODUODENOSCOPY (EGD) WITH PROPOFOL BIOPSY  Patient Location: PACU  Anesthesia Type:General  Level of Consciousness: awake and sedated  Airway & Oxygen Therapy: Patient Spontanous Breathing and Patient connected to nasal cannula oxygen  Post-op Assessment: Report given to RN and Post -op Vital signs reviewed and stable  Post vital signs: Reviewed and stable  Last Vitals:  Vitals Value Taken Time  BP    Temp    Pulse    Resp    SpO2      Last Pain:  Vitals:   04/29/23 1208  TempSrc: Temporal  PainSc: 0-No pain         Complications: There were no known notable events for this encounter.

## 2023-04-29 NOTE — Op Note (Signed)
[facility] Patient Name: Sheila Newton Procedure Date: 04/29/2023 12:17 PM MRN: [id number] Account #: [id number] Date of Birth: 11/17/1940 Admit Type: Outpatient Age: 82 Room: [facility] ENDO ROOM 3 Gender: Female Note Status: Finalized Instrument Name: Upper Endoscope [id number] Procedure:             Upper GI endoscopy Indications:           Abnormal CT of the GI tract, , thickened stomach Providers:             Toney Reil MD, MD Referring MD:          Toney Reil MD, MD (Referring MD), No Local Md,                         MD (Referring MD) Medicines:             General Anesthesia Complications:         No immediate complications. Estimated blood loss: None. Procedure:             Pre-Anesthesia Assessment:                        - Prior to the procedure, a History and Physical was                         performed, and patient medications and allergies were                         reviewed. The patient is competent. The risks and                         benefits of the procedure and the sedation options and                         risks were discussed with the patient. All questions                         were answered and informed consent was obtained.                         Patient identification and proposed procedure were                         verified by the physician, the nurse, the                         anesthesiologist, the anesthetist and the technician                         in the pre-procedure area in the procedure room in the                         endoscopy suite. Mental Status Examination: alert and                         oriented. Airway Examination: normal oropharyngeal                         airway and neck mobility. Respiratory Examination:  clear to auscultation. CV Examination: normal.                         Prophylactic Antibiotics: The patient does not require                          prophylactic antibiotics. Prior Anticoagulants: The                         patient has taken no anticoagulant or antiplatelet                         agents. ASA Grade Assessment: II - A patient with mild                         systemic disease. After reviewing the risks and                         benefits, the patient was deemed in satisfactory                         condition to undergo the procedure. The anesthesia                         plan was to use general anesthesia. Immediately prior                         to administration of medications, the patient was                         re-assessed for adequacy to receive sedatives. The                         heart rate, respiratory rate, oxygen saturations,                         blood pressure, adequacy of pulmonary ventilation, and                         response to care were monitored throughout the                         procedure. The physical status of the patient was                         re-assessed after the procedure.                        After obtaining informed consent, the endoscope was                         passed under direct vision. Throughout the procedure,                         the patient's blood pressure, pulse, and oxygen                         saturations were monitored continuously. The Endoscope  was introduced through the mouth, and advanced to the                         second part of duodenum. The upper GI endoscopy was                         accomplished without difficulty. The patient tolerated                         the procedure well. Findings:      The duodenal bulb and second portion of the duodenum were normal.      Diffuse polypoid mucosa was found in the gastric fundus. Biopsies were       taken with a cold forceps for histology.      Diffuse moderately congested mucosa was found in the gastric body and in       the gastric antrum. Biopsies were taken with  a cold forceps for       histology.      The gastroesophageal junction and examined esophagus were normal.      A small hiatal hernia was present. Impression:            - Normal duodenal bulb and second portion of the                         duodenum.                        - Nodular mucosa in the gastric fundus. Biopsied.                        - Congestive gastropathy. Biopsied.                        - Normal gastroesophageal junction and esophagus.                        - Small hiatal hernia. Recommendation:        - Await pathology results.                        - Discharge patient to home (with escort).                        - Resume previous diet today.                        - Continue present medications. Procedure Code(s):     --- Professional ---                        [phone], Esophagogastroduodenoscopy, flexible,                         transoral; with biopsy, single or multiple Diagnosis Code(s):     --- Professional ---                        K44.9, Diaphragmatic hernia without obstruction or                         gangrene  K31.89, Other diseases of stomach and duodenum                        R93.3, Abnormal findings on diagnostic imaging of                         other parts of digestive tract CPT copyright 2022 American Medical Association. All rights reserved. The codes documented in this report are preliminary and upon coder review may  be revised to meet current compliance requirements. Dr. Libby Maw Toney Reil MD, MD 04/29/2023 1:30:21 PM This report has been signed electronically. Number of Addenda: 0 Note Initiated On: 04/29/2023 12:17 PM Estimated Blood Loss:  Estimated blood loss: none.      [facility]

## 2023-04-29 NOTE — Anesthesia Postprocedure Evaluation (Signed)
Anesthesia Post Note  Patient: Sheila Newton  Procedure(s) Performed: ESOPHAGOGASTRODUODENOSCOPY (EGD) WITH PROPOFOL BIOPSY  Patient location during evaluation: Endoscopy Anesthesia Type: General Level of consciousness: awake and alert Pain management: pain level controlled Vital Signs Assessment: post-procedure vital signs reviewed and stable Respiratory status: spontaneous breathing, nonlabored ventilation, respiratory function stable and patient connected to nasal cannula oxygen Cardiovascular status: blood pressure returned to baseline and stable Postop Assessment: no apparent nausea or vomiting Anesthetic complications: no   There were no known notable events for this encounter.   Last Vitals:  Vitals:   04/29/23 1330 04/29/23 1340  BP:    Pulse:    Resp: 16 16  Temp: (!) 36.4 C   SpO2:      Last Pain:  Vitals:   04/29/23 1340  TempSrc:   PainSc: 0-No pain                 Corinda Gubler

## 2023-04-29 NOTE — Anesthesia Preprocedure Evaluation (Signed)
Anesthesia Evaluation  Patient identified by MRN, date of birth, ID band Patient awake    Reviewed: Allergy & Precautions, NPO status , Patient's Chart, lab work & pertinent test results  Airway Mallampati: III  TM Distance: >3 FB Neck ROM: full    Dental  (+) Dental Advidsory Given   Pulmonary neg pulmonary ROS   Pulmonary exam normal        Cardiovascular negative cardio ROS Normal cardiovascular exam     Neuro/Psych  Neuromuscular disease  negative psych ROS   GI/Hepatic Neg liver ROS,GERD  Medicated,,  Endo/Other  negative endocrine ROS    Renal/GU negative Renal ROS  negative genitourinary   Musculoskeletal   Abdominal   Peds  Hematology negative hematology ROS (+)   Anesthesia Other Findings Past Medical History: 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  Past Surgical History: No date: ABDOMINAL HYSTERECTOMY No date: back pain     Comment:  lumbar No date: COLONOSCOPY 2017: RADICAL VAGINAL HYSTERECTOMY 07/17/2022: ZENKER'S DIVERTICULECTOMY; Left  BMI    Body Mass Index: 21.26 kg/m      Reproductive/Obstetrics negative OB ROS                             Anesthesia Physical Anesthesia Plan  ASA: 2  Anesthesia Plan: General   Post-op Pain Management: Minimal or no pain anticipated   Induction: Intravenous  PONV Risk Score and Plan: 3 and Propofol infusion, TIVA and Ondansetron  Airway Management Planned: Nasal Cannula  Additional Equipment: None  Intra-op Plan:   Post-operative Plan:   Informed Consent: I have reviewed the patients History and Physical, chart, labs and discussed the procedure including the risks, benefits and alternatives for the proposed anesthesia with the patient or authorized representative who has indicated his/her understanding and acceptance.     Dental advisory given  Plan Discussed with: CRNA and  Surgeon  Anesthesia Plan Comments: (Discussed risks of anesthesia with patient, including possibility of difficulty with spontaneous ventilation under anesthesia necessitating airway intervention, PONV, and rare risks such as cardiac or respiratory or neurological events, and allergic reactions. Discussed the role of CRNA in patient's perioperative care. Patient understands.)       Anesthesia Quick Evaluation

## 2023-04-30 ENCOUNTER — Other Ambulatory Visit: Payer: Self-pay

## 2023-04-30 ENCOUNTER — Encounter: Payer: Self-pay | Admitting: Gastroenterology

## 2023-05-03 ENCOUNTER — Encounter: Payer: Self-pay | Admitting: Gastroenterology

## 2023-05-03 LAB — SURGICAL PATHOLOGY

## 2023-05-07 ENCOUNTER — Other Ambulatory Visit: Payer: Self-pay | Admitting: Student

## 2023-05-07 ENCOUNTER — Encounter: Payer: Self-pay | Admitting: Student

## 2023-05-07 DIAGNOSIS — R0981 Nasal congestion: Secondary | ICD-10-CM

## 2023-05-07 DIAGNOSIS — K5903 Drug induced constipation: Secondary | ICD-10-CM

## 2023-05-07 MED ORDER — FLUTICASONE PROPIONATE 50 MCG/ACT NA SUSP: 2.0000 | Freq: Every day | NASAL | 6 refills | Status: AC

## 2023-05-07 NOTE — Telephone Encounter (Signed)
Message routed to PCP Beamer, Victoria, MD  

## 2023-05-08 ENCOUNTER — Other Ambulatory Visit: Payer: Self-pay

## 2023-05-08 MED ORDER — LUBIPROSTONE 8 MCG PO CAPS
16.0000 ug | ORAL_CAPSULE | Freq: Two times a day (BID) | ORAL | 6 refills | Status: DC | PRN
  Filled 2023-05-08: qty 60, 15d supply, fill #0

## 2023-05-09 ENCOUNTER — Other Ambulatory Visit: Payer: Self-pay

## 2023-05-17 ENCOUNTER — Other Ambulatory Visit: Payer: Self-pay

## 2023-06-06 ENCOUNTER — Other Ambulatory Visit: Payer: Self-pay | Admitting: Student

## 2023-06-06 DIAGNOSIS — L719 Rosacea, unspecified: Secondary | ICD-10-CM

## 2023-06-07 ENCOUNTER — Other Ambulatory Visit: Payer: Self-pay

## 2023-06-07 NOTE — Telephone Encounter (Signed)
 Will send to [name], MD to review and approve

## 2023-06-11 ENCOUNTER — Other Ambulatory Visit: Payer: Self-pay | Admitting: Student

## 2023-06-11 DIAGNOSIS — K5903 Drug induced constipation: Secondary | ICD-10-CM

## 2023-06-14 ENCOUNTER — Other Ambulatory Visit: Payer: Self-pay

## 2023-06-15 ENCOUNTER — Other Ambulatory Visit: Payer: Self-pay

## 2023-06-15 ENCOUNTER — Telehealth: Payer: Self-pay

## 2023-06-15 DIAGNOSIS — K5903 Drug induced constipation: Secondary | ICD-10-CM

## 2023-06-15 MED ORDER — LUBIPROSTONE 8 MCG PO CAPS
8.0000 ug | ORAL_CAPSULE | Freq: Two times a day (BID) | ORAL | 1 refills | Status: DC
  Filled 2023-06-15: qty 180, 90d supply, fill #0

## 2023-06-15 NOTE — Telephone Encounter (Addendum)
Patient states that medication Lubiprostone was changed to two in the morning and two at night and this hasn't been sent into the pharmacy correctly. Please edit medication and send into pharmacy. Message routed back to PCP [name], MD.

## 2023-06-15 NOTE — Telephone Encounter (Signed)
Patient medication was sent into pharmacy incorrectly. Patient medication was increased to two capsules twice daily. Prescription that was sent in is for 1 capsule two times daily. Message routed back to PCP [name], MD for correction on refill. Please advise.

## 2023-06-15 NOTE — Telephone Encounter (Signed)
[name], MD      Sent rx in for patient. Thanks.

## 2023-06-16 ENCOUNTER — Other Ambulatory Visit: Payer: Self-pay

## 2023-06-16 ENCOUNTER — Other Ambulatory Visit: Payer: Self-pay | Admitting: *Deleted

## 2023-06-16 DIAGNOSIS — K5903 Drug induced constipation: Secondary | ICD-10-CM

## 2023-06-16 MED ORDER — LUBIPROSTONE 8 MCG PO CAPS
8.0000 ug | ORAL_CAPSULE | Freq: Two times a day (BID) | ORAL | 1 refills | Status: DC
  Filled 2023-06-16: qty 180, 90d supply, fill #0

## 2023-06-16 MED ORDER — LUBIPROSTONE 8 MCG PO CAPS
16.0000 ug | ORAL_CAPSULE | Freq: Two times a day (BID) | ORAL | 1 refills | Status: DC
  Filled 2023-06-16: qty 180, 45d supply, fill #0

## 2023-06-16 MED ORDER — LUBIPROSTONE 8 MCG PO CAPS: 16.0000 ug | ORAL_CAPSULE | Freq: Two times a day (BID) | ORAL | 1 refills | Status: DC

## 2023-06-16 NOTE — Telephone Encounter (Signed)
Patient walked into clinic stating that she went to her pharmacy and her Amitza Rx was not there. Rx was sent to [facility].   Patient does not get regular Rx's from there, Only her Pain Medications.   [facility] uses [facility].   Resent Rx to Requested pharmacy and received confirmation that they received it.

## 2023-06-16 NOTE — Telephone Encounter (Addendum)
Patient called to express her frustrations with rx not being written corrected. Patients asked "Why is this such a problem?" I explained to patient the medication list was not updated although written in the patient instructions at visit on 04/21/23   Patient emphasized that she has been out of this medication and needs request resolved quickly. RX updated and sent.  Outgoing call placed to pharmacy, spoke with Earna Coder and asked that he void all rx's that state 1 bid and only honor rx stating 2 bid.

## 2023-06-16 NOTE — Telephone Encounter (Signed)
 ERROR

## 2023-06-16 NOTE — Addendum Note (Signed)
Addended by: Maurice Small on: 06/16/2023 10:06 AM   Modules accepted: Orders

## 2023-06-18 NOTE — Telephone Encounter (Signed)
Contacted patient to discuss recent confusion. All medications (except pain medications, which are not prescribed by [facility]) should be [facility].   Patient is to continue Amitiza at 16 mcg BID as it adequately manages her symptoms. Put a not in her chart regarding the appropriate location for medications.   Appreciate CMA for calling pharmacy to clarify orders.   vb

## 2023-06-23 ENCOUNTER — Ambulatory Visit (INDEPENDENT_AMBULATORY_CARE_PROVIDER_SITE_OTHER): Payer: Medicare Other | Admitting: Pulmonary Disease

## 2023-06-23 ENCOUNTER — Encounter: Payer: Self-pay | Admitting: Pulmonary Disease

## 2023-06-23 VITALS — BP 120/70 | HR 97 | Temp 98.4°F | Ht 63.0 in | Wt 121.8 lb

## 2023-06-23 DIAGNOSIS — K2950 Unspecified chronic gastritis without bleeding: Secondary | ICD-10-CM | POA: Diagnosis not present

## 2023-06-23 DIAGNOSIS — R1013 Epigastric pain: Secondary | ICD-10-CM

## 2023-06-23 DIAGNOSIS — R0602 Shortness of breath: Secondary | ICD-10-CM

## 2023-06-23 DIAGNOSIS — K449 Diaphragmatic hernia without obstruction or gangrene: Secondary | ICD-10-CM

## 2023-06-23 DIAGNOSIS — K219 Gastro-esophageal reflux disease without esophagitis: Secondary | ICD-10-CM

## 2023-06-23 NOTE — Patient Instructions (Addendum)
VISIT SUMMARY:  During today's visit, we discussed your ongoing issues with shortness of breath and gastrointestinal symptoms. You mentioned that your breathing has improved since the summer, and we explored potential links to your pain medication. We also reviewed the results of your recent gastroenterology evaluation, which showed significant stomach inflammation and a small hiatal hernia. Additionally, we talked about your osteopenia treatment and its possible side effects.  YOUR PLAN:  -DYSPNEA: Dyspnea means difficulty breathing. Your symptoms have improved since the summer, possibly due to avoiding certain activities and the potential impact of your pain medication. We discussed the benefits of a pulmonary rehabilitation exercise program to help improve your breathing. We will follow up in four months to assess your progress.  -GASTRIC INFLAMMATION: Gastric inflammation refers to irritation and swelling in the stomach lining. Your recent EGD showed significant inflammation, possibly due to medication or food. You are experiencing persistent burping and gas. We recommend taking Protonix 40 mg in the morning and Pepcid AC 20 mg at bedtime and considering a second opinion from another gastroenterologist if your symptoms persist.  -HIATAL HERNIA: A hiatal hernia occurs when a part of the stomach pushes up through the diaphragm. This can contribute to your gastric symptoms. We will manage this conservatively with your current treatment plan for gastric inflammation.  -OSTEOPENIA: Osteopenia is a condition where bone density is lower than normal, which can lead to weaker bones. You are currently managing this with Prolia and calcium supplements. We discussed the potential side effects of Prolia on your gastrointestinal symptoms and considered alternative treatments, including vitamin D with K2. You should discuss these alternatives with your endocrinologist.  INSTRUCTIONS:  Please follow up in four  months to assess your progress with dyspnea and gastric symptoms. Additionally, discuss your Prolia and calcium supplements with your endocrinologist to explore alternative treatments.

## 2023-06-23 NOTE — Progress Notes (Unsigned)
Subjective:    Patient ID: Sheila Newton, female    DOB: [birth year], 83 y.o.   MRN: [id number]  Patient Care Team: [name], MD as PCP - General (Family Medicine) Salena Saner, MD as Consulting Physician (Pulmonary Disease)  Chief Complaint  Patient presents with   Follow-up    BACKGROUND/INTERVAL:  HPI Discussed the use of AI scribe software for clinical note transcription with the patient, who gave verbal consent to proceed.  History of Present Illness   The patient presents with dyspnea and gastrointestinal symptoms.  She experiences dyspnea, which has improved since the summer. She attributes this improvement to avoiding activities like gardening with hot water, which she associates with her symptoms. She describes her current state as 'a lot better than in the summer.'  She expresses disappointment with her recent gastroenterology evaluation. An esophagogastroduodenoscopy (EGD) revealed significant inflammation in her stomach, described as 'chemical inflammation,' possibly related to medication or food. She experiences persistent burping and gas, which she describes as worse than ever before. A CT scan showed a thickened and inflamed portion of the stomach and a small hiatal hernia. Biopsies taken during the EGD showed areas of inflammation and polypoid swelling.  She is taking pain medication, which her pain management doctor does not believe is related to her gastrointestinal symptoms. However, she acknowledges that the medication may contribute to her sensation of shortness of breath by lowering her respiratory drive.  She has been on Prolia for approximately two years, noting that her gastrointestinal symptoms have worsened during this time. She is also taking calcium supplements, which she was advised to take due to the potential for Prolia to lower calcium levels.        Review of Systems A 10 point review of systems was performed and it is as noted above  otherwise negative.   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   Encounter for long-term opiate analgesic use 11/26/2020   Spinal stenosis, lumbar region, 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/26/2020   Chronic pain syndrome 11/26/2020   Small fiber neuropathy 11/26/2020   Piriformis syndrome of both sides 11/26/2020   Neuroforaminal stenosis of lumbar spine (L5/S1, severe bilateral) 11/26/2020   Compression fracture of T12 vertebra (HCC) 06/22/2019   GERD without esophagitis 06/13/2019   Callus of foot 10/26/2017   Great toe pain, right 10/26/2017   Hypercholesterolemia 07/20/2002   Allergic rhinitis 04/15/2001    Social History   Tobacco Use   Smoking status: Never    Passive exposure: Never   Smokeless tobacco: Never  Substance Use Topics   Alcohol use: Never    Allergies  Allergen Reactions   Sumatriptan Anxiety, Palpitations and Other (See Comments)    Felt weak    Penicillin G Anxiety and Other (See Comments)    Reaction unknown given a lot of it as a child. Was told not to take anymore of it Reaction unknown given a lot of it  as a child. Was told not to take anymore of it Reaction unknown given a lot of it as a child. Was told not to take anymore of it Reaction unknown given a lot of it as a child. Was told not to take anymore of it    Topiramate Other (See Comments)   Ilosone [Erythromycin]  Other (See Comments)    Current Meds  Medication Sig   Alpha-Lipoic Acid 600 MG CAPS Take one by mouth once daily.   atorvastatin (LIPITOR) 20 MG tablet TAKE 1 TABLET BY MOUTH EVERY DAY   buprenorphine (BUTRANS) 20 MCG/HR PTWK Place 1 patch onto the skin once a week. For chronic pain syndrome. Ok for pt to fill up to 3 days earlier than fill date.   Calcium Carbonate (CALCIUM 500 PO) Take one by mouth once daily.   Cholecalciferol (VITAMIN D3) 50 MCG (2000 UT) TABS Take one tablet by mouth daily.   Coenzyme Q10-Vitamin E (QUNOL ULTRA COQ10 PO) Take by mouth daily.   Cyanocobalamin (VITAMIN B 12 PO) Take by mouth daily.   fluticasone (FLONASE) 50 MCG/ACT nasal spray Place 2 sprays into both nostrils daily.   levalbuterol (XOPENEX HFA) 45 MCG/ACT inhaler Inhale 2 puffs into the lungs every 4 (four) hours as needed for wheezing.   lubiprostone (AMITIZA) 8 MCG capsule Take 2 capsules (16 mcg total) by mouth 2 (two) times daily with a meal. K59.03   Magnesium 200 MG CHEW Chew by mouth daily.   metroNIDAZOLE (METROGEL) 1 % gel APPLY TO AFFECTED AREA TOPICALLY EVERY DAY   Multiple Vitamins-Minerals (MULTIVITAMIN ADULT) CHEW Chew by mouth daily.   pantoprazole (PROTONIX) 40 MG tablet Take 1 tablet (40 mg total) by mouth 2 (two) times daily.   PREMPRO 0.3-1.5 MG tablet TAKE 1 TABLET BY MOUTH EVERY DAY   PROLIA 60 MG/ML SOSY injection Inject 60 mg into the skin as needed. Twice a year.   traMADol (ULTRAM) 50 MG tablet Take 1 tablet (50 mg total) by mouth 2 (two) times daily as needed for severe pain (pain score 7-10).   traMADol (ULTRAM-ER) 100 MG 24 hr tablet Take 1 tablet (100 mg total) by mouth at bedtime. Ok for pt to fill up to 3 days earlier than fill date.    Immunization History  Administered Date(s) Administered   Fluad Quad(high Dose 65+) 02/22/2022, 03/16/2023   Influenza-Unspecified 03/27/2019, 03/25/2020   Moderna SARS-COV2 Booster Vaccination 03/01/2021, 10/21/2021   Moderna  Sars-Covid-2 Vaccination 06/08/2019, 07/06/2019, 04/09/2020, 10/13/2020   Pneumococcal Conjugate,unspecified 03/08/2001, 04/23/2006   Pneumococcal Conjugate-13 02/07/2013   Pneumococcal Polysaccharide-23 04/23/2006   RSV,unspecified 04/29/2022   Tdap 12/21/2018   Zoster, Live 09/02/2005, 08/06/2017        Objective:     BP 120/70 (BP Location: Right Arm, Patient Position: Sitting, Cuff Size: Normal)   Pulse 97   Temp 98.4 F (36.9 C) (Oral)   Ht 5\' 3"  (1.6 m)   Wt 121 lb 12.8 oz (55.2 kg)   SpO2 99%   BMI 21.58 kg/m   SpO2: 99 % O2 Device: None (Room air)  GENERAL: HEAD: Normocephalic, atraumatic.  EYES: Pupils equal, round, reactive to light.  No scleral icterus.  MOUTH:  NECK: Supple. No thyromegaly. Trachea midline. No JVD.  No adenopathy. PULMONARY: Good air entry bilaterally.  No adventitious sounds. CARDIOVASCULAR: S1 and S2. Regular rate and rhythm.  ABDOMEN: MUSCULOSKELETAL: No joint deformity, no clubbing, no edema.  NEUROLOGIC:  SKIN: Intact,warm,dry. PSYCH:        Assessment &  Plan:     ICD-10-CM   1. Shortness of breath  R06.02     2. 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 Dyspepsia  R10.13     4. Hiatal hernia with GERD  K44.9    K21.9       No orders of the defined types were placed in this encounter.   No orders of the defined types were placed in this encounter.  Assessment and Plan    Dyspnea Chronic dyspnea with no identified pulmonary etiology. Symptoms improve in winter compared to summer. Possible link to pain medication reducing respiratory drive. Discussed potential benefits of pulmonary rehabilitation. - Consider pulmonary rehabilitation exercise program - Follow-up in four months to assess progress  Gastric Inflammation Significant gastric inflammation noted on EGD with polypoid areas and biopsies showing chemical gastropathy. No H. pylori detected. Symptoms include constant burping and gas, worsening  over the past two years. Potential reaction to medication or food. Considered alternative treatments and second opinion. - Take Pepcid AC 20 mg at bedtime - Consider second opinion from another gastroenterologist if symptoms persist  Hiatal Hernia Small hiatal hernia noted on CT scan, contributing to gastric symptoms. - Manage conservatively with current treatment plan for gastric inflammation  Osteopenia Osteopenia managed with Prolia and calcium supplements. Potential side effects from Prolia contributing to gastric symptoms. Discussed alternative treatments including vitamin D with K2. - Discuss alternative treatments with endocrinologist - Consider vitamin D with K2 as a supplement  Follow-up - Follow-up in four months to assess progress with dyspnea and gastric symptoms - Discuss Prolia and calcium supplements with endocrinologist.          Advised if symptoms do not improve or worsen, to please contact office for sooner follow up or seek emergency care.    I spent xxx minutes of dedicated to the care of this patient on the date of this encounter to include pre-visit review of records, face-to-face time with the patient discussing conditions above, post visit ordering of testing, clinical documentation with the electronic health record, making appropriate referrals as documented, and communicating necessary findings to members of the patients care team.     C. Danice Goltz, MD Advanced Bronchoscopy PCCM [facility]    *This note was generated using voice recognition software/Dragon and/or AI transcription program.  Despite best efforts to proofread, errors can occur which can change the meaning. Any transcriptional errors that result from this process are unintentional and may not be fully corrected at the time of dictation.

## 2023-06-24 ENCOUNTER — Telehealth: Payer: Self-pay | Admitting: Pulmonary Disease

## 2023-06-24 NOTE — Telephone Encounter (Signed)
Patient would like to know the nasal spray that was suggested to her yesterday at her appointment with Dr.Gonzales. She would like to know for her endocrinology appointment for next week.

## 2023-06-24 NOTE — Telephone Encounter (Signed)
Dr. Erick Blinks or Dr. Marsa Aris at [facility] in [place]

## 2023-06-24 NOTE — Progress Notes (Signed)
Location:   [facility] Parts of Service:   [facility] Provider:  [name], MD  Patient Care Team: [name], MD as PCP - General (Family Medicine) Salena Saner, MD as Consulting Physician (Pulmonary Disease)  Extended Emergency Contact Information Primary Emergency Contact: Schneiderman,ROBERT Address: [address]          [place], [place] [address] [place] of [place] Home Phone: [phone] Mobile Phone: [phone] Relation: Spouse  Code Status:  DNR Goals of care: Advanced Directive information    04/29/2023   12:06 PM  Advanced Directives  Does Patient Have a Medical Advance Directive? Yes  Type of Estate agent of Congress;Living will  Copy of Healthcare Power of Attorney in Chart? No - copy requested     Chief Complaint  Patient presents with   Medical Management of Chronic Issues    Medical Management of Chronic Issues. 2 Month follow up.  Wants to have a red patchy area on back looked at that she has had for 3-4 months.    Quality Metric Gaps    To discuss need of Zoster.     HPI:  Discussed the use of AI scribe software for clinical note transcription with the patient, who gave verbal consent to proceed.  History of Present Illness   The patient presents with generalized itching and skin concerns.  She experiences generalized itching across her back, describing it as 'on fire.' The itching is persistent and not localized to any specific area. She previously wore a patch on her back, which she believes may have contributed to the itching, but has since moved it to her shoulder. Cortisone itch cream provides temporary relief, and she applies Cetaphil moisturizer about four times a week, especially when the itching becomes bothersome.  She has a red patch on her back, approximately three and a half centimeters in size, present for two to three months. There is no itching specifically from this patch. She has  an upcoming appointment with a dermatologist within a month. Historically, she has not applied moisturizer to her back, only to her arms and legs.  She experiences gastrointestinal issues, including frequent burping, increased gas, and irregular bowel movements. She describes having a 'leaky bowel,' with small pieces of stool passing unexpectedly, ongoing for one to two years. She has a history of constipation and hemorrhoids, which bleed occasionally, particularly when irritated by frequent bowel movements. She has used steroid suppositories in the past and is interested in further treatment options for hemorrhoids. She has been taking Pepcid for gastrointestinal symptoms, which she started the previous night. She also takes vitamin D, coenzyme Q, and magnesium, noting that magnesium may affect her stool consistency. She has not been taking alpha lipoic acid recently, which was previously recommended for nerve issues.  She has a history of shortness of breath and dizziness, for which she has seen ENT, cardiology, and pulmonary specialists. She recently visited a pulmonologist, Dr. Jayme Cloud, who recommended further evaluation by a gastroenterologist. She underwent an endoscopy, which revealed a small hiatal hernia and other findings that were not clearly communicated to her by the gastroenterologist. She is considering a second opinion and has been referred to other gastroenterologists.  She reports hair thinning, not falling out in clumps, and has been recommended to use Nutrafol shampoo and biotin for hair, nails, and skin. She has not noticed any significant changes in her hair condition.       Past Medical History:  Diagnosis Date  Small fiber polyneuropathy    Spondylolysis    Past Surgical History:  Procedure Laterality Date   ABDOMINAL HYSTERECTOMY     back pain     lumbar   BIOPSY  04/29/2023   Procedure: BIOPSY;  Surgeon: Toney Reil, MD;  Location: [facility] ENDOSCOPY;  Service:  Gastroenterology;;  Gastric Funds and Random Gastric   COLONOSCOPY     ESOPHAGOGASTRODUODENOSCOPY (EGD) WITH PROPOFOL N/A 04/29/2023   Procedure: ESOPHAGOGASTRODUODENOSCOPY (EGD) WITH PROPOFOL;  Surgeon: Toney Reil, MD;  Location: [facility] ENDOSCOPY;  Service: Gastroenterology;  Laterality: N/A;   RADICAL VAGINAL HYSTERECTOMY  2017   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 Was told not to take anymore of it Reaction unknown given a lot of it as a child. Was told not to take anymore of it Reaction unknown given a lot of it as a child. Was told not to take anymore of it Reaction unknown given a lot of it as a child. Was told not to take anymore of it    Topiramate Other (See Comments)   Ilosone [Erythromycin] Other (See Comments)    Outpatient Encounter Medications as of 06/25/2023  Medication Sig   Alpha-Lipoic Acid 600 MG CAPS Take one by mouth once daily.   atorvastatin (LIPITOR) 20 MG tablet TAKE 1 TABLET BY MOUTH EVERY DAY   buprenorphine (BUTRANS) 20 MCG/HR PTWK Place 1 patch onto the skin once a week. For chronic pain syndrome. Ok for pt to fill up to 3 days earlier than fill date.   Calcium Carbonate (CALCIUM 500 PO) Take one by mouth once daily.   Cholecalciferol (VITAMIN D3) 50 MCG (2000 UT) TABS Take one tablet by mouth daily.   Coenzyme Q10-Vitamin E (QUNOL ULTRA COQ10 PO) Take by mouth daily.   Cyanocobalamin (VITAMIN B 12 PO) Take by mouth daily.   famotidine (PEPCID) 20 MG tablet Take 20 mg by mouth at bedtime.   fluticasone (FLONASE) 50 MCG/ACT nasal spray Place 2 sprays into both nostrils daily.   levalbuterol (XOPENEX HFA) 45 MCG/ACT inhaler Inhale 2 puffs into the lungs every 4 (four) hours as needed for wheezing.   lubiprostone (AMITIZA) 8 MCG capsule Take  2 capsules (16 mcg total) by mouth 2 (two) times daily with a meal. K59.03   Magnesium 200 MG CHEW Chew by mouth daily.   metroNIDAZOLE (METROGEL) 1 % gel APPLY TO AFFECTED AREA TOPICALLY EVERY DAY   Multiple Vitamins-Minerals (MULTIVITAMIN ADULT) CHEW Chew by mouth daily.   pantoprazole (PROTONIX) 40 MG tablet Take 1 tablet (40 mg total) by mouth 2 (two) times daily.   PREMPRO 0.3-1.5 MG tablet TAKE 1 TABLET BY MOUTH EVERY DAY   PROLIA 60 MG/ML SOSY injection Inject 60 mg into the skin as needed. Twice a year.   traMADol (ULTRAM) 50 MG tablet Take 1 tablet (50 mg total) by mouth 2 (two) times daily as needed for severe pain (pain score 7-10).   traMADol (ULTRAM-ER) 100 MG 24 hr tablet Take 1 tablet (100 mg total) by mouth at bedtime. Ok for pt to fill up to 3 days earlier than fill date.   No facility-administered encounter medications on file as of 06/25/2023.    Review of Systems  Immunization History  Administered Date(s) Administered  Fluad Quad(high Dose 65+) 02/22/2022, 03/16/2023   Influenza-Unspecified 03/27/2019, 03/25/2020   Moderna SARS-COV2 Booster Vaccination 03/01/2021, 10/21/2021   Moderna Sars-Covid-2 Vaccination 06/08/2019, 07/06/2019, 04/09/2020, 10/13/2020   Pneumococcal Conjugate,unspecified 03/08/2001, 04/23/2006   Pneumococcal Conjugate-13 02/07/2013   Pneumococcal Polysaccharide-23 04/23/2006   RSV,unspecified 04/29/2022   Tdap 12/21/2018   Zoster, Live 09/02/2005, 08/06/2017   Pertinent  Health Maintenance Due  Topic Date Due   INFLUENZA VACCINE  Completed   DEXA SCAN  Completed      12/09/2022    2:59 PM 01/19/2023    9:18 AM 04/13/2023    1:27 PM 04/21/2023    1:34 PM 06/23/2023    9:45 AM  Fall Risk  Falls in the past year? 0 0 0 0 0  Was there an injury with Fall? 1      Fall Risk Category Calculator 1      Patient at Risk for Falls Due to   No Fall Risks    Fall risk Follow up   Falls evaluation completed     Functional Status Survey:     Vitals:   06/25/23 1257  BP: 132/78  Pulse: 86  Temp: (!) 97.4 F (36.3 C)  SpO2: 99%  Weight: 122 lb (55.3 kg)  Height: 5\' 3"  (1.6 m)   Body mass index is 21.61 kg/m. Physical Exam Constitutional:      Appearance: Normal appearance.  Neurological:     Mental Status: She is alert.  Psychiatric:        Mood and Affect: Mood normal.        Behavior: Behavior normal.    Physical Exam   SKIN: Cherry angiomas and seborrheic keratosis on back. Erythema at area where bra meets spine. 3.5 cm red patch on back. hyperkeratotic lesion of the right shoulder resembling a wart, recommended for cryotherapy.      Labs reviewed: Recent Labs    01/07/23 1159  NA 135  K 5.2*  CL 106  CO2 24  GLUCOSE 86  BUN 22  CREATININE 1.03*  CALCIUM 9.1   Recent Labs    01/07/23 1159  AST 23  ALT 18  ALKPHOS 40  BILITOT 0.4  PROT 6.9  ALBUMIN 3.7   Recent Labs    01/07/23 1159  WBC 7.4  NEUTROABS 5.2  HGB 11.6*  HCT 36.7  MCV 91.8  PLT 213   Lab Results  Component Value Date   TSH 1.326 01/07/2023   No results found for: "HGBA1C" No results found for: "CHOL", "HDL", "LDLCALC", "LDLDIRECT", "TRIG", "CHOLHDL"  Significant Diagnostic Results in last 30 days:  No results found. Results   DIAGNOSTIC Endoscopy: Small hiatal hernia, biopsies taken      Assessment/Plan Assessment and Plan    Shortness of Breath and Dizziness Ongoing symptoms with unremarkable evaluations by ENT, cardiology, and pulmonology. Pulmonary rehab recommended but deferred until other health issues are addressed. - Consider pulmonary rehab after addressing other health issues - Seek second opinion from gastroenterologists in [place]  Belching/Gas symptoms Had an endoscopy which showed small hiatal hernia, frequent burping, gas, and irregular bowel movements. Recent endoscopy showed small bumps; biopsies taken. Reports leaky bowel and frequent small stool passage. Discussed anorectal manometry  for stool leakage assessment. - Continue Pepcid - Schedule appointment with new gastroenterologist - Consider anorectal manometry for stool leakage assessment  Pruritus Generalized itching of the back, exacerbated during winter. No specific rash noted. Possible contributing factors include dry skin and new detergent use. Discussed daily moisturizing and  potential irritants in new detergents. - Apply Cetaphil moisturizer daily - Consider switching back to previous detergent - Use cortisone itch cream as needed  Eczematous patch Two 3.5 cm erythematous square patches on the back, present for 2-3 months. Differential diagnosis include eczema vs contact dermatitis or fungal infection. Some associated itching. Discussed starting with a topical steroid cream and follow up with dermatology evaluation. - Apply triamcinolone cream twice daily for one week - Follow up with dermatologist in one month  Hemorrhoids Chronic hemorrhoids with occasional bleeding, especially with frequent bowel movements. History of hemorrhoid surgery and banding. Discussed hemorrhoid banding and steroid suppositories if bleeding becomes chronic. - Consider hemorrhoid banding - Use steroid suppositories if bleeding becomes chronic  Osteoporosis Management Currently on Prolia. Hx of T12 spinal fracture on 06/22/2019 consistent with osteoporosis. Concerns about long-term use and potential side effects. Discussed alternative treatments including zoledronic acid (Reclast) and the importance of consistent treatment. Need for kidney function check before next Prolia injection. - Discuss with endocrinologist about continuing or switching from Prolia - Consider zoledronic acid (Reclast) as an alternative - Check kidney function before next Prolia injection  General Health Maintenance Routine health maintenance discussed including vitamin and supplement use. Discussed potential impact of magnesium on stool consistency. - Order  labs to check kidney and liver function, hemoglobin, and thyroid levels - Discuss vitamin and supplement use  Follow-up - Schedule lab work for Monday at 7:30 AM - Follow up with dermatologist in one month - Schedule appointment with new gastroenterologist - Discuss osteoporosis management with endocrinologist in two weeks.     Hair loss Labs: CBC, Vit D, Vit b12, iron   There are other unrelated non-urgent complaints, but due to the busy schedule and the amount of time I've already spent with her, time does not permit me to address these routine issues at today's visit. I've requested another appointment to review these additional issues.  I spent greater than 40 minutes for the care of this patient in face to face time, chart review, clinical documentation, patient education.

## 2023-06-24 NOTE — Telephone Encounter (Signed)
I have notified the patient. Nothing further needed.

## 2023-06-24 NOTE — Telephone Encounter (Signed)
Patient is calling back. She would like a recommendation for a gastroenterologist in [place]. She can be reached at [phone]

## 2023-06-24 NOTE — Telephone Encounter (Signed)
Calcitonin-salmon nasal spray.

## 2023-06-24 NOTE — Telephone Encounter (Signed)
However I am not sure about the calcitonin spray availability.

## 2023-06-25 ENCOUNTER — Telehealth: Payer: Self-pay | Admitting: Internal Medicine

## 2023-06-25 ENCOUNTER — Ambulatory Visit: Payer: Medicare Other | Admitting: Student

## 2023-06-25 ENCOUNTER — Encounter: Payer: Self-pay | Admitting: Student

## 2023-06-25 VITALS — BP 132/78 | HR 86 | Temp 97.4°F | Ht 63.0 in | Wt 122.0 lb

## 2023-06-25 DIAGNOSIS — D509 Iron deficiency anemia, unspecified: Secondary | ICD-10-CM | POA: Diagnosis not present

## 2023-06-25 DIAGNOSIS — L309 Dermatitis, unspecified: Secondary | ICD-10-CM

## 2023-06-25 DIAGNOSIS — E7800 Pure hypercholesterolemia, unspecified: Secondary | ICD-10-CM | POA: Diagnosis not present

## 2023-06-25 DIAGNOSIS — L659 Nonscarring hair loss, unspecified: Secondary | ICD-10-CM

## 2023-06-25 DIAGNOSIS — M810 Age-related osteoporosis without current pathological fracture: Secondary | ICD-10-CM

## 2023-06-25 DIAGNOSIS — R739 Hyperglycemia, unspecified: Secondary | ICD-10-CM

## 2023-06-25 DIAGNOSIS — K219 Gastro-esophageal reflux disease without esophagitis: Secondary | ICD-10-CM

## 2023-06-25 MED ORDER — TRIAMCINOLONE ACETONIDE 0.1 % EX CREA: 1.0000 | TOPICAL_CREAM | Freq: Two times a day (BID) | CUTANEOUS | 0 refills | Status: DC

## 2023-06-25 NOTE — Telephone Encounter (Signed)
Good afternoon Dr. Rhea Belton,    Patient is wishing to transfer her care specifically over to you. Patient was last seen with [facility] in December. Patient stated you were highly recommended to her. Patient is wishing to be seen for excess gas. Requesting to transfer due to not feeling cared for by previous provider. Patient's previous records are in Epic for you to review and advise on scheduling.    Thank you.

## 2023-06-25 NOTE — Patient Instructions (Addendum)
Dr. Lynann Bologna - is a GI physician in [place].   If your are considering discontinuing Prolia, I agree with changing to a different option. I like zoledronic acid in particular.   VISIT SUMMARY:  During today's visit, we discussed your generalized itching, gastrointestinal issues, shortness of breath, dizziness, hair thinning, and osteoporosis management. We reviewed your current symptoms, treatments, and planned further evaluations and follow-ups to address your concerns.  YOUR PLAN:  -SHORTNESS OF BREATH AND DIZZINESS: Your shortness of breath and dizziness have been evaluated by ENT, cardiology, and pulmonology specialists, with no significant findings. We will consider pulmonary rehab after addressing your other health issues and seek a second opinion from gastroenterologists in [place].  -GASTROINTESTINAL ISSUES: You have a small hiatal hernia and experience frequent burping, gas, and irregular bowel movements. We will continue with Pepcid, schedule an appointment with a new gastroenterologist, and consider anorectal manometry to assess stool leakage.  -PRURITUS: Pruritus means itching. Your generalized itching may be due to dry skin or a new detergent. We recommend applying Cetaphil moisturizer daily, considering switching back to your previous detergent, and using cortisone itch cream as needed.  -RED PATCH ON BACK: You have a red patch on your back that may be a yeast or fungal infection. We suggest applying triamcinolone cream twice daily for one week and following up with your dermatologist in one month.  -HEMORRHOIDS: Hemorrhoids are swollen veins in the lower rectum and anus that can cause bleeding and discomfort. We discussed the possibility of hemorrhoid banding and using steroid suppositories if bleeding becomes chronic.  -OSTEOPOROSIS MANAGEMENT: Osteoporosis is a condition where bones become weak and brittle. You are currently on Prolia, and we discussed the potential for  switching to zoledronic acid (Reclast) and the importance of consistent treatment. We will check your kidney function before your next Prolia injection.  -GENERAL HEALTH MAINTENANCE: We discussed routine health maintenance, including vitamin and supplement use, and the potential impact of magnesium on stool consistency. We will order labs to check your kidney and liver function, hemoglobin, and thyroid levels.  INSTRUCTIONS:  Please schedule your lab work for Monday at 7:30 AM. Follow up with your dermatologist in one month and schedule an appointment with a new gastroenterologist. Discuss osteoporosis management with your endocrinologist in two weeks.

## 2023-06-25 NOTE — Addendum Note (Signed)
Addended by: [name] on: 06/25/2023 10:40 PM   Modules accepted: Orders

## 2023-06-28 NOTE — Telephone Encounter (Signed)
Ok for appt, nonurgent; explain to pt that wait to see me can be lengthy

## 2023-06-30 ENCOUNTER — Ambulatory Visit: Payer: Medicare Other | Admitting: Pulmonary Disease

## 2023-06-30 NOTE — Telephone Encounter (Signed)
Called patient to discuss scheduling. Left voicemail.

## 2023-07-05 ENCOUNTER — Encounter: Payer: Self-pay | Admitting: Pulmonary Disease

## 2023-07-05 ENCOUNTER — Encounter: Payer: Self-pay | Admitting: Gastroenterology

## 2023-07-06 LAB — HEMOGLOBIN A1C
Hgb A1c MFr Bld: 5.9 %{Hb} — ABNORMAL HIGH (ref <5.7)
Mean Plasma Glucose: 123 mg/dL
eAG (mmol/L): 6.8 mmol/L

## 2023-07-06 LAB — COMPLETE METABOLIC PANEL WITH GFR
AG Ratio: 1.7 (calc) (ref 1.0–2.5)
ALT: 20 U/L (ref 6–29)
AST: 23 U/L (ref 10–35)
Albumin: 4 g/dL (ref 3.6–5.1)
Alkaline phosphatase (APISO): 38 U/L (ref 37–153)
BUN/Creatinine Ratio: 25 (calc) — ABNORMAL HIGH (ref 6–22)
BUN: 29 mg/dL — ABNORMAL HIGH (ref 7–25)
CO2: 30 mmol/L (ref 20–32)
Calcium: 9.6 mg/dL (ref 8.6–10.4)
Chloride: 102 mmol/L (ref 98–110)
Creat: 1.15 mg/dL — ABNORMAL HIGH (ref 0.60–0.95)
Globulin: 2.4 g/dL (ref 1.9–3.7)
Glucose, Bld: 82 mg/dL (ref 65–99)
Potassium: 4.7 mmol/L (ref 3.5–5.3)
Sodium: 137 mmol/L (ref 135–146)
Total Bilirubin: 0.4 mg/dL (ref 0.2–1.2)
Total Protein: 6.4 g/dL (ref 6.1–8.1)
eGFR: 48 mL/min/{1.73_m2} — ABNORMAL LOW (ref >=60)

## 2023-07-06 LAB — LIPID PANEL
Cholesterol: 228 mg/dL — ABNORMAL HIGH (ref <200)
HDL: 98 mg/dL (ref >=50)
LDL Cholesterol (Calc): 113 mg/dL — ABNORMAL HIGH
Non-HDL Cholesterol (Calc): 130 mg/dL — ABNORMAL HIGH (ref <130)
Total CHOL/HDL Ratio: 2.3 (calc) (ref <5.0)
Triglycerides: 76 mg/dL (ref <150)

## 2023-07-06 LAB — CBC WITH DIFFERENTIAL/PLATELET
Absolute Lymphocytes: 1821 {cells}/uL (ref 850–3900)
Absolute Monocytes: 611 {cells}/uL (ref 200–950)
Basophils Absolute: 38 {cells}/uL (ref 0–200)
Basophils Relative: 0.6 %
Eosinophils Absolute: 252 {cells}/uL (ref 15–500)
Eosinophils Relative: 4 %
HCT: 36.3 % (ref 35.0–45.0)
Hemoglobin: 11.9 g/dL (ref 11.7–15.5)
MCH: 29.2 pg (ref 27.0–33.0)
MCHC: 32.8 g/dL (ref 32.0–36.0)
MCV: 89.2 fL (ref 80.0–100.0)
MPV: 11.2 fL (ref 7.5–12.5)
Monocytes Relative: 9.7 %
Neutro Abs: 3578 {cells}/uL (ref 1500–7800)
Neutrophils Relative %: 56.8 %
Platelets: 218 10*3/uL (ref 140–400)
RBC: 4.07 10*6/uL (ref 3.80–5.10)
RDW: 13.2 % (ref 11.0–15.0)
Total Lymphocyte: 28.9 %
WBC: 6.3 10*3/uL (ref 3.8–10.8)

## 2023-07-06 LAB — IRON,TIBC AND FERRITIN PANEL
%SAT: 15 % — ABNORMAL LOW (ref 16–45)
Ferritin: 46 ng/mL (ref 16–288)
Iron: 48 ug/dL (ref 45–160)
TIBC: 319 ug/dL (ref 250–450)

## 2023-07-06 LAB — VITAMIN B12: Vitamin B-12: 1113 pg/mL — ABNORMAL HIGH (ref 200–1100)

## 2023-07-06 LAB — VITAMIN D 25 HYDROXY (VIT D DEFICIENCY, FRACTURES): Vit D, 25-Hydroxy: 71 ng/mL (ref 30–100)

## 2023-07-09 ENCOUNTER — Encounter: Payer: Self-pay | Admitting: Student

## 2023-07-11 ENCOUNTER — Encounter: Payer: Self-pay | Admitting: Student

## 2023-07-12 ENCOUNTER — Telehealth: Payer: Self-pay | Admitting: Student in an Organized Health Care Education/Training Program

## 2023-07-12 ENCOUNTER — Other Ambulatory Visit: Payer: Self-pay

## 2023-07-12 NOTE — Telephone Encounter (Signed)
 PT has appt tomorrow for mm , patient will like to know if she can get a cortisone shot. Pt stated that she will be going out of town on Thursday and will love to get this done. PT also wants to know if Late[facility] will okay pharmacy to allow her to pick up both of her Tramadol on tomorrow.PT will be leaving Thursday. TY

## 2023-07-12 NOTE — Telephone Encounter (Signed)
 Informed her that Dr. Cherylann Ratel does not do injections on Tuesday or Thursday. She has med management appt tomorrow. I asked her to discuss getting meds early at that appt.

## 2023-07-13 ENCOUNTER — Other Ambulatory Visit: Payer: Self-pay

## 2023-07-13 ENCOUNTER — Encounter: Payer: Self-pay | Admitting: Student in an Organized Health Care Education/Training Program

## 2023-07-13 ENCOUNTER — Ambulatory Visit: Payer: Medicare Other | Admitting: Nurse Practitioner

## 2023-07-13 ENCOUNTER — Ambulatory Visit
Payer: Medicare Other | Attending: Student in an Organized Health Care Education/Training Program | Admitting: Student in an Organized Health Care Education/Training Program

## 2023-07-13 ENCOUNTER — Encounter: Payer: Self-pay | Admitting: Nurse Practitioner

## 2023-07-13 VITALS — BP 138/82 | HR 65 | Temp 97.6°F | Ht 63.0 in | Wt 121.0 lb

## 2023-07-13 VITALS — BP 148/82 | HR 102 | Temp 97.1°F | Resp 16 | Ht 63.0 in | Wt 120.0 lb

## 2023-07-13 DIAGNOSIS — Z79891 Long term (current) use of opiate analgesic: Secondary | ICD-10-CM | POA: Insufficient documentation

## 2023-07-13 DIAGNOSIS — M533 Sacrococcygeal disorders, not elsewhere classified: Secondary | ICD-10-CM | POA: Diagnosis not present

## 2023-07-13 DIAGNOSIS — M461 Sacroiliitis, not elsewhere classified: Secondary | ICD-10-CM | POA: Insufficient documentation

## 2023-07-13 DIAGNOSIS — M47816 Spondylosis without myelopathy or radiculopathy, lumbar region: Secondary | ICD-10-CM | POA: Insufficient documentation

## 2023-07-13 DIAGNOSIS — M1610 Unilateral primary osteoarthritis, unspecified hip: Secondary | ICD-10-CM | POA: Insufficient documentation

## 2023-07-13 DIAGNOSIS — F419 Anxiety disorder, unspecified: Secondary | ICD-10-CM | POA: Diagnosis not present

## 2023-07-13 DIAGNOSIS — M48061 Spinal stenosis, lumbar region without neurogenic claudication: Secondary | ICD-10-CM | POA: Diagnosis present

## 2023-07-13 DIAGNOSIS — M48062 Spinal stenosis, lumbar region with neurogenic claudication: Secondary | ICD-10-CM | POA: Insufficient documentation

## 2023-07-13 DIAGNOSIS — M25552 Pain in left hip: Secondary | ICD-10-CM | POA: Diagnosis present

## 2023-07-13 DIAGNOSIS — R0602 Shortness of breath: Secondary | ICD-10-CM

## 2023-07-13 DIAGNOSIS — G894 Chronic pain syndrome: Secondary | ICD-10-CM | POA: Insufficient documentation

## 2023-07-13 MED ORDER — SERTRALINE HCL 50 MG PO TABS: ORAL_TABLET | ORAL | 1 refills | Status: DC

## 2023-07-13 MED ORDER — TRAMADOL HCL 50 MG PO TABS
50.0000 mg | ORAL_TABLET | Freq: Two times a day (BID) | ORAL | 2 refills | Status: DC | PRN
  Filled 2023-07-13: qty 60, 30d supply, fill #0
  Filled 2023-08-13: qty 60, 30d supply, fill #1
  Filled 2023-09-14: qty 60, 30d supply, fill #2

## 2023-07-13 MED ORDER — BUPRENORPHINE 20 MCG/HR TD PTWK
1.0000 | MEDICATED_PATCH | TRANSDERMAL | 2 refills | Status: DC
  Filled 2023-07-13: qty 4, 28d supply, fill #0
  Filled 2023-08-13: qty 4, 28d supply, fill #1
  Filled 2023-09-14: qty 4, 28d supply, fill #2

## 2023-07-13 MED ORDER — TRAMADOL HCL ER 100 MG PO TB24
100.0000 mg | ORAL_TABLET | Freq: Every day | ORAL | 2 refills | Status: DC
  Filled 2023-07-13: qty 30, 30d supply, fill #0
  Filled 2023-08-13: qty 30, 30d supply, fill #1
  Filled 2023-09-14: qty 30, 30d supply, fill #2

## 2023-07-13 MED ORDER — METHYLPREDNISOLONE 4 MG PO TBPK
ORAL_TABLET | ORAL | 0 refills | Status: AC
  Filled 2023-07-13: qty 21, 6d supply, fill #0

## 2023-07-13 NOTE — Patient Instructions (Signed)
 Start zoloft 25 mg daily- half tablet for 2 weeks then increase to whole tablet

## 2023-07-13 NOTE — Progress Notes (Signed)
 Careteam: Patient Care Team: [name], MD as PCP - General (Family Medicine) Salena Saner, MD as Consulting Physician (Pulmonary Disease)   PLACE OF SERVICE: [facility]   Advanced Directive information     Allergies  Allergen Reactions   Sumatriptan Anxiety, Palpitations and Other (See Comments)    Felt weak    Penicillin G Anxiety and Other (See Comments)    Reaction unknown given a lot of it as a child. Was told not to take anymore of it Reaction unknown given a lot of it as a child. Was told not to take anymore of it Reaction unknown given a lot of it as a child. Was told not to take anymore of it Reaction unknown given a lot of it as a child. Was told not to take anymore of it    Topiramate Other (See Comments)   Ilosone [Erythromycin] Other (See Comments)     Chief Complaint  Patient presents with   Medical Management of Chronic Issues    Wants to discuss taking Fluoxetine due to stress.       HPI: Patient is a 83 y.o. female presents for anxiety  Patient reports she has a trip planned to [place] for about a week, worrying about the weather, hoping her trip doesn't get cancelled.   Patient would like to discuss the possibility of initiating fluoxetine for her anxiety. Patient reports being under more stress recently, denies depression. Feels like she worries frequently, but reports she is sleeping well. States both of her daughter have taken fluoxetine and have seen good results from it. Open to other suggestions if necessary.   Reports she has had issues with losing weight in the past and being unable to gain weight. Weight has been stable recently, does not want to gain or lose weight.   Labs drawn 2/10 were reviewed.  A1c 5.9, kidney function abnormal. Worries she was dehydrated when she had lab work done. Reports history of constipation, alternating diarrhea with treatment.   Reports some ongoing shortness of breath and dizziness with  exertion. Has seen ENT, cardiology, GI, pulmonology and states they have not found the cause of this. Dizziness is worse during hot months and when she's more active. However, still attends exercise classes. Has an appointment with her pain clinic.  Review of Systems:  Review of Systems  Constitutional:  Negative for weight loss.  Respiratory:  Positive for shortness of breath (with exertion). Negative for cough.   Cardiovascular:  Negative for chest pain, palpitations and leg swelling.  Gastrointestinal:  Positive for constipation. Negative for abdominal pain, nausea and vomiting.  Neurological:  Positive for dizziness (with position changes/exertion). Negative for weakness and headaches.  Psychiatric/Behavioral:  Negative for depression. The patient is nervous/anxious. The patient does not have insomnia.     Past Medical History:  Diagnosis Date   Small fiber polyneuropathy    Spondylolysis     Past Surgical History:  Procedure Laterality Date   ABDOMINAL HYSTERECTOMY     back pain     lumbar   BIOPSY  04/29/2023   Procedure: BIOPSY;  Surgeon: Toney Reil, MD;  Location: [facility] ENDOSCOPY;  Service: Gastroenterology;;  Gastric Funds and Random Gastric   COLONOSCOPY     ESOPHAGOGASTRODUODENOSCOPY (EGD) WITH PROPOFOL N/A 04/29/2023   Procedure: ESOPHAGOGASTRODUODENOSCOPY (EGD) WITH PROPOFOL;  Surgeon: Toney Reil, MD;  Location: [facility] ENDOSCOPY;  Service: Gastroenterology;  Laterality: N/A;   RADICAL VAGINAL HYSTERECTOMY  2017   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 She has never been exposed to tobacco smoke. She has never used smokeless tobacco. She reports that she does not drink alcohol and does not use drugs.  Family History  Problem Relation Age of Onset   Bladder Cancer Mother    Transient ischemic attack Father    Arthritis/Rheumatoid Father    Breast cancer Neg Hx      Medications:  Patient's  Medications  New Prescriptions   SERTRALINE (ZOLOFT) 50 MG TABLET    Start with half tablet for 2 weeks then increase to 1 tablet daily  Previous Medications   ALPHA-LIPOIC ACID 600 MG CAPS    Take one by mouth once daily.   ATORVASTATIN (LIPITOR) 20 MG TABLET    TAKE 1 TABLET BY MOUTH EVERY DAY   BUPRENORPHINE (BUTRANS) 20 MCG/HR PTWK    Place 1 patch onto the skin once a week. For chronic pain syndrome. Ok for pt to fill up to 3 days earlier than fill date.   CALCIUM CARBONATE (CALCIUM 500 PO)    Take one by mouth once daily.   CHOLECALCIFEROL (VITAMIN D3) 50 MCG (2000 UT) TABS    Take one tablet by mouth daily.   COENZYME Q10-VITAMIN E (QUNOL ULTRA COQ10 PO)    Take by mouth daily.   CYANOCOBALAMIN (VITAMIN B 12 PO)    Take by mouth daily.   FAMOTIDINE (PEPCID) 20 MG TABLET    Take 20 mg by mouth at bedtime.   FLUTICASONE (FLONASE) 50 MCG/ACT NASAL SPRAY    Place 2 sprays into both nostrils daily.   LEVALBUTEROL (XOPENEX HFA) 45 MCG/ACT INHALER    Inhale 2 puffs into the lungs every 4 (four) hours as needed for wheezing.   LUBIPROSTONE (AMITIZA) 8 MCG CAPSULE    Take 2 capsules (16 mcg total) by mouth 2 (two) times daily with a meal. K59.03   MAGNESIUM 200 MG CHEW    Chew by mouth daily.   METRONIDAZOLE (METROGEL) 1 % GEL    APPLY TO AFFECTED AREA TOPICALLY EVERY DAY   MULTIPLE VITAMINS-MINERALS (MULTIVITAMIN ADULT) CHEW    Chew by mouth daily.   PANTOPRAZOLE (PROTONIX) 40 MG TABLET    Take 1 tablet (40 mg total) by mouth 2 (two) times daily.   PREMPRO 0.3-1.5 MG TABLET    TAKE 1 TABLET BY MOUTH EVERY DAY   PROLIA 60 MG/ML SOSY INJECTION    Inject 60 mg into the skin as needed. Twice a year.   TRAMADOL (ULTRAM) 50 MG TABLET    Take 1 tablet (50 mg total) by mouth 2 (two) times daily as needed for severe pain (pain score 7-10).   TRAMADOL (ULTRAM-ER) 100 MG 24 HR TABLET    Take 1 tablet (100 mg total) by mouth at bedtime. Ok for pt to fill up to 3 days earlier than fill date.   TRIAMCINOLONE  CREAM (KENALOG) 0.1 %    Apply 1 Application topically 2 (two) times daily.  Modified Medications   No medications on file  Discontinued Medications   No medications on file     Physical Exam:  Vitals:   07/13/23 0839  BP: 138/82  Pulse: 65  Temp: 97.6 F (36.4 C)  SpO2: 99%  Weight: 121 lb (54.9 kg)  Height: 5\' 3"  (1.6 m)   Body mass index is 21.43 kg/m.  Wt Readings from Last 3 Encounters:  07/13/23 121 lb (54.9 kg)  06/25/23 122 lb (55.3 kg)  06/23/23 121 lb 12.8 oz (55.2 kg)     Physical Exam Constitutional:      Appearance: Normal appearance.  Cardiovascular:     Rate and Rhythm: Normal rate and regular rhythm.     Pulses: Normal pulses.     Heart sounds: Normal heart sounds.  Pulmonary:     Effort: Pulmonary effort is normal.     Breath sounds: Normal breath sounds.  Abdominal:     General: Bowel sounds are normal.     Palpations: Abdomen is soft.  Skin:    General: Skin is warm and dry.  Neurological:     Mental Status: She is alert and oriented to person, place, and time.  Psychiatric:        Thought Content: Thought content normal.     Labs reviewed: Basic Metabolic Panel:  Recent Labs    01/07/23 1159 07/05/23 0729  NA 135 137  K 5.2* 4.7  CL 106 102  CO2 24 30  GLUCOSE 86 82  BUN 22 29*  CREATININE 1.03* 1.15*  CALCIUM 9.1 9.6  TSH 1.326  --    Liver Function Tests:  Recent Labs    01/07/23 1159 07/05/23 0729  AST 23 23  ALT 18 20  ALKPHOS 40  --   BILITOT 0.4 0.4  PROT 6.9 6.4  ALBUMIN 3.7  --    No results for input(s): "LIPASE", "AMYLASE" in the last 8760 hours. No results for input(s): "AMMONIA" in the last 8760 hours. CBC:  Recent Labs    01/07/23 1159 07/05/23 0729  WBC 7.4 6.3  NEUTROABS 5.2 3,578  HGB 11.6* 11.9  HCT 36.7 36.3  MCV 91.8 89.2  PLT 213 218   Lipid Panel:  Recent Labs    07/05/23 0729  CHOL 228*  HDL 98  LDLCALC 113*  TRIG 76  CHOLHDL 2.3   TSH:  Recent Labs    01/07/23 1159  TSH 1.326    A1C:  Lab Results  Component Value Date   HGBA1C 5.9 (H) 07/05/2023     Assessment/Plan   1. Anxiety (Primary) -Patient in agreement to try sertraline since it is more weight neutral when compared to fluoxetine - sertraline (ZOLOFT) 50 MG tablet; Start with half tablet for 2 weeks then increase to 1 tablet daily  Dispense: 30 tablet; Refill: 1 -Encouraged journaling, meditation, and exercise -Patient plans to start taking sertraline when she returns from [place], doesn't want to risk experiencing side effects while she is on vacation -Notify immediately if symptoms worsen or undesirable side effects occur  2. Shortness of breath -Ongoing with exertion, no signs of respiratory distress during visit -Recently saw pulmonology June 23, 2023, has follow-up in 4 months -Continue care with specialists, seek emergency care if symptoms worsens   She has scheduled follow up scheduled with Dr Sydnee Cabal in 5 weeks. Will keep as scheduled Rollen Sox, Haroldine Laws MSN-FNP Student -I personally was present during the history, physical exam and medical decision-making activities of this service and have verified that the service and findings are accurately documented in the student's note  Mayford Alberg K. Biagio Borg [facility] [phone]

## 2023-07-13 NOTE — Patient Instructions (Signed)
 ______________________________________________________________________    General Risks and Possible Complications  Patient Responsibilities: It is important that you read this as it is part of your informed consent. It is our duty to inform you of the risks and possible complications associated with treatments offered to you. It is your responsibility as a patient to read this and to ask questions about anything that is not clear or that you believe was not covered in this document.  Patient's Rights: You have the right to refuse treatment. You also have the right to change your mind, even after initially having agreed to have the treatment done. However, under this last option, if you wait until the last second to change your mind, you may be charged for the materials used up to that point.  Introduction: Medicine is not an Visual merchandiser. Everything in Medicine, including the lack of treatment(s), carries the potential for danger, harm, or loss (which is by definition: Risk). In Medicine, a complication is a secondary problem, condition, or disease that can aggravate an already existing one. All treatments carry the risk of possible complications. The fact that a side effects or complications occurs, does not imply that the treatment was conducted incorrectly. It must be clearly understood that these can happen even when everything is done following the highest safety standards.  No treatment: You can choose not to proceed with the proposed treatment alternative. The "PRO(s)" would include: avoiding the risk of complications associated with the therapy. The "CON(s)" would include: not getting any of the treatment benefits. These benefits fall under one of three categories: diagnostic; therapeutic; and/or palliative. Diagnostic benefits include: getting information which can ultimately lead to improvement of the disease or symptom(s). Therapeutic benefits are those associated with the successful  treatment of the disease. Finally, palliative benefits are those related to the decrease of the primary symptoms, without necessarily curing the condition (example: decreasing the pain from a flare-up of a chronic condition, such as incurable terminal cancer).  General Risks and Complications: These are associated to most interventional treatments. They can occur alone, or in combination. They fall under one of the following six (6) categories: no benefit or worsening of symptoms; bleeding; infection; nerve damage; allergic reactions; and/or death. No benefits or worsening of symptoms: In Medicine there are no guarantees, only probabilities. No healthcare provider can ever guarantee that a medical treatment will work, they can only state the probability that it may. Furthermore, there is always the possibility that the condition may worsen, either directly, or indirectly, as a consequence of the treatment. Bleeding: This is more common if the patient is taking a blood thinner, either prescription or over the counter (example: Goody Powders, Fish oil, Aspirin, Garlic, etc.), or if suffering a condition associated with impaired coagulation (example: Hemophilia, cirrhosis of the liver, low platelet counts, etc.). However, even if you do not have one on these, it can still happen. If you have any of these conditions, or take one of these drugs, make sure to notify your treating physician. Infection: This is more common in patients with a compromised immune system, either due to disease (example: diabetes, cancer, human immunodeficiency virus [HIV], etc.), or due to medications or treatments (example: therapies used to treat cancer and rheumatological diseases). However, even if you do not have one on these, it can still happen. If you have any of these conditions, or take one of these drugs, make sure to notify your treating physician. Nerve Damage: This is more common when the treatment is  an invasive one, but it  can also happen with the use of medications, such as those used in the treatment of cancer. The damage can occur to small secondary nerves, or to large primary ones, such as those in the spinal cord and brain. This damage may be temporary or permanent and it may lead to impairments that can range from temporary numbness to permanent paralysis and/or brain death. Allergic Reactions: Any time a substance or material comes in contact with our body, there is the possibility of an allergic reaction. These can range from a mild skin rash (contact dermatitis) to a severe systemic reaction (anaphylactic reaction), which can result in death. Death: In general, any medical intervention can result in death, most of the time due to an unforeseen complication. ______________________________________________________________________      ______________________________________________________________________    Preparing for your procedure  Appointments: If you think you may not be able to keep your appointment, call 24-48 hours in advance to cancel. We need time to make it available to others.  Procedure visits are for procedures only. During your procedure appointment there will be: NO Prescription Refills*. NO medication changes or discussions*. NO discussion of disability issues*. NO unrelated pain problem evaluations*. NO evaluations to order other pain procedures*. *These will be addressed at a separate and distinct evaluation encounter on the provider's evaluation schedule and not during procedure days.  Instructions: Food intake: Avoid eating anything solid for at least 8 hours prior to your procedure. Clear liquid intake: You may take clear liquids such as water up to 2 hours prior to your procedure. (No carbonated drinks. No soda.) Transportation: Unless otherwise stated by your physician, bring a driver. (Driver cannot be a Market researcher, Pharmacist, community, or any other form of public transportation.) Morning  Medicines: Except for blood thinners, take all of your other morning medications with a sip of water. Make sure to take your heart and blood pressure medicines. If your blood pressure's lower number is above 100, the case will be rescheduled. Blood thinners: Make sure to stop your blood thinners as instructed.  If you take a blood thinner, but were not instructed to stop it, call our office [phone] and ask to talk to a nurse. Not stopping a blood thinner prior to certain procedures could lead to serious complications. Diabetics on insulin: Notify the staff so that you can be scheduled 1st case in the morning. If your diabetes requires high dose insulin, take only  of your normal insulin dose the morning of the procedure and notify the staff that you have done so. Preventing infections: Shower with an antibacterial soap the morning of your procedure.  Build-up your immune system: Take 1000 mg of Vitamin C with every meal (3 times a day) the day prior to your procedure. Antibiotics: Inform the nursing staff if you are taking any antibiotics or if you have any conditions that may require antibiotics prior to procedures. (Example: recent joint implants)   Pregnancy: If you are pregnant make sure to notify the nursing staff. Not doing so may result in injury to the fetus, including death.  Sickness: If you have a cold, fever, or any active infections, call and cancel or reschedule your procedure. Receiving steroids while having an infection may result in complications. Arrival: You must be in the facility at least 30 minutes prior to your scheduled procedure. Tardiness: Your scheduled time is also the cutoff time. If you do not arrive at least 15 minutes prior to your procedure, you will  be rescheduled.  Children: Do not bring any children with you. Make arrangements to keep them home. Dress appropriately: There is always a possibility that your clothing may get soiled. Avoid long dresses. Valuables:  Do not bring any jewelry or valuables.  Reasons to call and reschedule or cancel your procedure: (Following these recommendations will minimize the risk of a serious complication.) Surgeries: Avoid having procedures within 2 weeks of any surgery. (Avoid for 2 weeks before or after any surgery). Flu Shots: Avoid having procedures within 2 weeks of a flu shots or . (Avoid for 2 weeks before or after immunizations). Barium: Avoid having a procedure within 7-10 days after having had a radiological study involving the use of radiological contrast. (Myelograms, Barium swallow or enema study). Heart attacks: Avoid any elective procedures or surgeries for the initial 6 months after a "Myocardial Infarction" (Heart Attack). Blood thinners: It is imperative that you stop these medications before procedures. Let us know if you if you take any blood thinner.  Infection: Avoid procedures during or within two weeks of an infection (including chest colds or gastrointestinal problems). Symptoms associated with infections include: Localized redness, fever, chills, night sweats or profuse sweating, burning sensation when voiding, cough, congestion, stuffiness, runny nose, sore throat, diarrhea, nausea, vomiting, cold or Flu symptoms, recent or current infections. It is specially important if the infection is over the area that we intend to treat. Heart and lung problems: Symptoms that may suggest an active cardiopulmonary problem include: cough, chest pain, breathing difficulties or shortness of breath, dizziness, ankle swelling, uncontrolled high or unusually low blood pressure, and/or palpitations. If you are experiencing any of these symptoms, cancel your procedure and contact your primary care physician for an evaluation.  Remember:  Regular Business hours are:  Monday to Thursday 8:00 AM to 4:00 PM  Provider's Schedule: Delano Metz, MD:  Procedure days: Tuesday and Thursday 7:30 AM to 4:00 PM  Edward Jolly, MD:  Procedure days: Monday and Wednesday 7:30 AM to 4:00 PM Last  Updated: 05/04/2023 ______________________________________________________________________     Selective Nerve Root Block Patient Information  Description: Specific nerve roots exit the spinal canal and these nerves can be compressed and inflamed by a bulging disc and bone spurs.  By injecting steroids on the nerve root, we can potentially decrease the inflammation surrounding these nerves, which often leads to decreased pain.  Also, by injecting local anesthesia on the nerve root, this can provide [place] helpful information to give to your referring doctor if it decreases your pain.  Selective nerve root blocks can be done along the spine from the neck to the low back depending on the location of your pain.   After numbing the skin with local anesthesia, a small needle is passed to the nerve root and the position of the needle is verified using x-ray pictures.  After the needle is in correct position, we then deposit the medication.  You may experience a pressure sensation while this is being done.  The entire block usually lasts less than 15 minutes.  Conditions that may be treated with selective nerve root blocks: Low back and leg pain Spinal stenosis Diagnostic block prior to potential surgery Neck and arm pain Post laminectomy syndrome  Preparation for the injection:  Do not eat any solid food or dairy products within 8 hours of your appointment. You may drink clear liquids up to 3 hours before an appointment.  Clear liquids include water, black coffee, juice or soda.  No milk  or cream please. You may take your regular medications, including pain medications, with a sip of water before your appointment.  Diabetics should hold regular insulin (if taken separately) and take 1/2 normal NPH dose the morning of the procedure.  Carry some sugar containing items with you to your appointment. A driver must accompany you and be  prepared to drive you home after your procedure. Bring all your current medications with you. An IV may be inserted and sedation may be given at the discretion of the physician. A blood pressure cuff, EKG, and other monitors will often be applied during the procedure.  Some patients may need to have extra oxygen administered for a short period. You will be asked to provide medical information, including allergies, prior to the procedure.  We must know immediately if you are taking blood  Thinners (like Coumadin) or if you are allergic to IV iodine contrast (dye).  Possible side-effects: All are usually temporary Bleeding from needle site Light headedness Numbness and tingling Decreased blood pressure Weakness in arms/legs Pressure sensation in back/neck Pain at injection site (several days)  Possible complications: All are extremely rare Infection Nerve injury Spinal headache (a headache wore with upright position)  Call if you experience: Fever/chills associated with headache or increased back/neck pain Headache worsened by an upright position New onset weakness or numbness of an extremity below the injection site Hives or difficulty breathing (go to the emergency room) Inflammation or drainage at the injection site(s) Severe back/neck pain greater than usual New symptoms which are concerning to you  Please note:  Although the local anesthetic injected can often make your back or neck feel good for several hours after the injection the pain will likely return.  It takes 3-5 days for steroids to work on the nerve root. You may not notice any pain relief for at least one week.  If effective, we will often do a series of 3 injections spaced 3-6 weeks apart to maximally decrease your pain.    If you have any questions, please call [phone] [facility]

## 2023-07-13 NOTE — Progress Notes (Signed)
 Nursing Pain Medication Assessment:  Safety precautions to be maintained throughout the outpatient stay will include: orient to surroundings, keep bed in low position, maintain call bell within reach at all times, provide assistance with transfer out of bed and ambulation.  Medication Inspection Compliance: Pill count conducted under aseptic conditions, in front of the patient. Neither the pills nor the bottle was removed from the patient's sight at any time. Once count was completed pills were immediately returned to the patient in their original bottle.  Medication #1: Buprenorphine (Suboxone) Pill/Patch Count:  0 of 4 patches remain Pill/Patch Appearance: Markings consistent with prescribed medication Bottle Appearance: Standard pharmacy container. Clearly labeled. Filled Date: 01 / 20 / 2025 Last Medication intake:  Today  Medication #2: Tramadol (Ultram) 50mg  Pill/Patch Count:  7 of 60 pills remain Pill/Patch Appearance: Markings consistent with prescribed medication Bottle Appearance: Standard pharmacy container. Clearly labeled. Filled Date: 01 / 20 / 2025 Last Medication intake:  Today  Medication #3: Tramadol (Ultram) 100mg  Pill/Patch Count: 4 of 30 pills remain Pill/Patch Appearance: Markings consistent with prescribed medication Bottle Appearance: Standard pharmacy container. Clearly labeled. Filled Date: 01 / 20 / 2025 Last Medication intake:  Today

## 2023-07-13 NOTE — Progress Notes (Signed)
 PROVIDER NOTE: Information contained herein reflects review and annotations entered in association with encounter. Interpretation of such information and data should be left to medically-trained personnel. Information provided to patient can be located elsewhere in the medical record under "Patient Instructions". Document created using STT-dictation technology, any transcriptional errors that may result from process are unintentional.    Patient: Sheila Newton  Service Category: E/M  Provider: Edward Jolly, MD  DOB: Nov 19, 1940  DOS: 07/13/2023  Referring Provider: [name], MD  MRN: [id number]  Specialty: Interventional Pain Management  PCP: [name], MD  Type: Established Patient  Setting: Ambulatory outpatient    Location: Office  Delivery: Face-to-face     HPI  [name], a 83 y.o. year old female, is here today because of her Lumbar spondylosis [M47.816]. [name] primary complain today is Leg Pain (Bilateral ) and Foot Pain (Bilateral )  Pertinent problems: Sheila Newton has Chronic radicular lumbar pain; Lumbar spondylosis; Lumbar degenerative disc disease; Pudendal neuralgia; Encounter for long-term opiate analgesic use; Spinal stenosis, lumbar region, with neurogenic claudication; Chronic pain syndrome; Small fiber neuropathy; Piriformis syndrome of both sides; Neuroforaminal stenosis of lumbar spine (L5/S1, severe bilateral); Pain management contract signed; Sacroiliac joint pain; and SI joint arthritis (HCC) on their pertinent problem list. Pain Assessment: Severity of Chronic pain is reported as a 2 /10. Location: Leg Right, Left/Pain from calves bilatetral, to feet bilateral, and toes. Onset: More than a month ago. Quality: Burning, Aching, Cramping. Timing: Constant. Modifying factor(s): Pain medication, frequent small movements, and sitting. Vitals:  height is 5\' 3"  (1.6 m) and weight is 120 lb (54.4 kg). Her temporal temperature is 97.1 F (36.2 C) (abnormal). Her blood  pressure is 148/82 (abnormal) and her pulse is 102 (abnormal). Her respiration is 16 and oxygen saturation is 100%.  BMI: Estimated body mass index is 21.26 kg/m as calculated from the following:   Height as of this encounter: 5\' 3"  (1.6 m).   Weight as of this encounter: 120 lb (54.4 kg). Last encounter: 04/13/23 Last procedure: 11/23/2022.  Reason for encounter: medication management.  Discussed the use of AI scribe software for clinical note transcription with the patient, who gave verbal consent to proceed.  History of Present Illness   Sheila Newton is an 83 year old female who presents with back pain and difficulty sleeping.  She experiences significant back pain, particularly at night, which affects her ability to sleep. She cannot sleep on her right or left hip due to bursitis, although she can occasionally manage a couple of hours on her left hip. Sleeping on her back for extended periods worsens her back pain, but rotating once during the night improves her pain upon waking. Approximately three weeks ago, she engaged in strenuous physical activity, helping someone move, which involved lifting and pulling. Since then, she has experienced pain primarily down her right side, which is gradually improving. She has adjusted her sleeping position to alleviate discomfort.  She inquired about receiving a cortisone shot in her hip to facilitate sleeping on her side, as she has not had a hip injection before. She recalls having a back injection about a year ago. She is considering trying an oral course of steroids to manage her symptoms.  She is preparing for a trip to [place] with her family and needs to ensure her prescriptions are filled before departure. She has been in contact with the hospital pharmacy regarding her medications, including Bydureon and tramadol, and plans to pick them up before her  trip.       Pharmacotherapy Assessment  Analgesic: Butrans patch 20 mcg an hour, tramadol 50 mg  twice daily as needed for breakthrough pain, tramadol ER 100 mg nightly   Monitoring: [place] PMP: PDMP reviewed during this encounter.       Pharmacotherapy: No side-effects or adverse reactions reported. Compliance: No problems identified. Effectiveness: Clinically acceptable.  Sheila Iba, RN  07/13/2023  1:40 PM  Sign when Signing Visit Nursing Pain Medication Assessment:  Safety precautions to be maintained throughout the outpatient stay will include: orient to surroundings, keep bed in low position, maintain call bell within reach at all times, provide assistance with transfer out of bed and ambulation.  Medication Inspection Compliance: Pill count conducted under aseptic conditions, in front of the patient. Neither the pills nor the bottle was removed from the patient's sight at any time. Once count was completed pills were immediately returned to the patient in their original bottle.  Medication #1: Buprenorphine (Suboxone) Pill/Patch Count:  0 of 4 patches remain Pill/Patch Appearance: Markings consistent with prescribed medication Bottle Appearance: Standard pharmacy container. Clearly labeled. Filled Date: 01 / 20 / 2025 Last Medication intake:  Today  Medication #2: Tramadol (Ultram) 50mg  Pill/Patch Count:  7 of 60 pills remain Pill/Patch Appearance: Markings consistent with prescribed medication Bottle Appearance: Standard pharmacy container. Clearly labeled. Filled Date: 01 / 20 / 2025 Last Medication intake:  Today  Medication #3: Tramadol (Ultram) 100mg  Pill/Patch Count: 4 of 30 pills remain Pill/Patch Appearance: Markings consistent with prescribed medication Bottle Appearance: Standard pharmacy container. Clearly labeled. Filled Date: 01 / 20 / 2025 Last Medication intake:  Today      No results found for: "CBDTHCR" No results found for: "D8THCCBX" No results found for: "D9THCCBX"  UDS:  Summary  Date Value Ref Range Status  05/12/2022 Note  Final    Comment:     ==================================================================== [employer] Select 13 (MW) ==================================================================== Test                             Result       Flag       Units  Drug Present and Declared for Prescription Verification   Buprenorphine                  35           EXPECTED   ng/mg creat   Norbuprenorphine               40           EXPECTED   ng/mg creat    Source of buprenorphine is a scheduled prescription medication.    Norbuprenorphine is an expected metabolite of buprenorphine.    Tramadol                       >3597        EXPECTED   ng/mg creat   O-Desmethyltramadol            >3597        EXPECTED   ng/mg creat   N-Desmethyltramadol            >3597        EXPECTED   ng/mg creat    Source of tramadol is a prescription medication. O-desmethyltramadol    and N-desmethyltramadol are expected metabolites of tramadol.  ==================================================================== Test  Result    Flag   Units      Ref Range   Creatinine              139              mg/dL      >=16 ==================================================================== Declared Medications:  The flagging and interpretation on this report are based on the  following declared medications.  Unexpected results may arise from  inaccuracies in the declared medications.   **Note: The testing scope of this panel includes these medications:   Tramadol (Ultram)   **Note: The testing scope of this panel does not include small to  moderate amounts of these reported medications:   Buprenorphine Patch (BuTrans)   **Note: The testing scope of this panel does not include the  following reported medications:   Aspirin  Atorvastatin  Cyanocobalamin  Denosumab (Prolia)  Estradiol  Fluoride (Prevident)  Lubiprostone (Amitiza)  Magnesium  Multivitamin  Pantoprazole (Protonix)  Ubiquinone (CoQ10)  Vitamin  E ==================================================================== For clinical consultation, please call [phone]. ====================================================================       ROS  Constitutional:  as above Gastrointestinal: No reported hemesis, hematochezia, vomiting, or acute GI distress Musculoskeletal: Denies any acute onset joint swelling, redness, loss of ROM, or weakness Neurological: No reported episodes of acute onset apraxia, aphasia, dysarthria, agnosia, amnesia, paralysis, loss of coordination, or loss of consciousness  Medication Review  Alpha-Lipoic Acid, Calcium Carbonate, Coenzyme Q10-Vitamin E, Cyanocobalamin, Magnesium, Multivitamin Adult, Vitamin D3, atorvastatin, buprenorphine, denosumab, estrogen (conjugated)-medroxyprogesterone, famotidine, fluticasone, levalbuterol, lubiprostone, methylPREDNISolone, metroNIDAZOLE, pantoprazole, sertraline, traMADol, and triamcinolone cream  History Review  Allergy: Sheila Newton is allergic to sumatriptan, penicillin g, topiramate, and ilosone [erythromycin]. Drug: Sheila Newton  reports no history of drug use. Alcohol:  reports no history of alcohol use. Tobacco:  reports that she has never smoked. She has never been exposed to tobacco smoke. She has never used smokeless tobacco. Social: Sheila Newton  reports that she has never smoked. She has never been exposed to tobacco smoke. She has never used smokeless tobacco. She reports that she does not drink alcohol and does not use drugs. Medical:  has a past medical history of Small fiber polyneuropathy and Spondylolysis. Surgical: Sheila Newton  has a past surgical history that includes back pain; Abdominal hysterectomy; Zenker's diverticulectomy (Left, 07/17/2022); Radical vaginal hysterectomy (2017); Colonoscopy; Esophagogastroduodenoscopy (egd) with propofol (N/A, 04/29/2023); and biopsy (04/29/2023). Family: family history includes Arthritis/Rheumatoid in her father; Bladder  Cancer in her mother; Transient ischemic attack in her father.  Laboratory Chemistry Profile   Renal Lab Results  Component Value Date   BUN 29 (H) 07/05/2023   CREATININE 1.15 (H) 07/05/2023   BCR 25 (H) 07/05/2023   GFRNONAA 54 (L) 01/07/2023    Hepatic Lab Results  Component Value Date   AST 23 07/05/2023   ALT 20 07/05/2023   ALBUMIN 3.7 01/07/2023   ALKPHOS 40 01/07/2023    Electrolytes Lab Results  Component Value Date   NA 137 07/05/2023   K 4.7 07/05/2023   CL 102 07/05/2023   CALCIUM 9.6 07/05/2023    Bone Lab Results  Component Value Date   VD25OH 71 07/05/2023    Inflammation (CRP: Acute Phase) (ESR: Chronic Phase) No results found for: "CRP", "ESRSEDRATE", "LATICACIDVEN"       Note: Above Lab results reviewed.  Recent Imaging Review  CT CHEST HIGH RESOLUTION CLINICAL DATA:  Shortness of breath.  EXAM: CT CHEST WITHOUT CONTRAST  TECHNIQUE: Multidetector CT imaging of the  chest was performed following the standard protocol without intravenous contrast. High resolution imaging of the lungs, as well as inspiratory and expiratory imaging, was performed.  RADIATION DOSE REDUCTION: This exam was performed according to the departmental dose-optimization program which includes automated exposure control, adjustment of the mA and/or kV according to patient size and/or use of iterative reconstruction technique.  COMPARISON:  Cardiac CT 11/12/2022.  FINDINGS: Cardiovascular: Atherosclerotic calcification of the aorta and coronary arteries. Heart is at the upper limits of normal in size. Ascending aorta measures up to 3.5 cm (6/67). No pericardial effusion.  Mediastinum/Nodes: No pathologically enlarged mediastinal or axillary lymph nodes. Hilar regions are difficult to definitively evaluate without IV contrast. Esophagus is grossly unremarkable.  Lungs/Pleura: Mild cylindrical bronchiectasis. Negative for subpleural reticulation, traction  bronchiectasis/bronchiolectasis, ground glass, architectural distortion or honeycombing. Mild scarring in the right middle lobe and lingula. 3 mm polygonal subpleural nodule in the apical left upper lobe (4/15), likely scarring or a benign subpleural lymph node. No pleural fluid. Airway is unremarkable. Mild air trapping.  Upper Abdomen: Visualized portions of the liver, gallbladder, adrenal glands, kidneys, spleen and pancreas are grossly unremarkable. There may be slight gastric wall thickening. No upper abdominal adenopathy.  Musculoskeletal: Degenerative changes in the spine. Old rib fractures. Old T7 and T12 compression fractures. Old sternal fracture.  IMPRESSION: 1. No evidence of fibrotic interstitial lung disease. Mild air trapping is indicative of small airways disease. 2. Mild cylindrical bronchiectasis. 3. Possible gastric wall thickening.  Please correlate clinically. 4. Aortic atherosclerosis (ICD10-I70.0). Coronary artery calcification.  Electronically Signed   By: Leanna Battles M.D.   On: 03/17/2023 08:28 Note: Reviewed        Physical Exam  General appearance: Well nourished, well developed, and well hydrated. In no apparent acute distress Mental status: Alert, oriented x 3 (person, place, & time)       Respiratory: No evidence of acute respiratory distress Eyes: PERLA Vitals: BP (!) 148/82   Pulse (!) 102   Temp (!) 97.1 F (36.2 C) (Temporal)   Resp 16   Ht 5\' 3"  (1.6 m)   Wt 120 lb (54.4 kg)   SpO2 100%   BMI 21.26 kg/m  BMI: Estimated body mass index is 21.26 kg/m as calculated from the following:   Height as of this encounter: 5\' 3"  (1.6 m).   Weight as of this encounter: 120 lb (54.4 kg). Ideal: Ideal body weight: 52.4 kg (115 lb 8.3 oz) Adjusted ideal body weight: 53.2 kg (117 lb 5 oz)  Left hip pain, TTP  Assessment   Diagnosis Status  1. Lumbar spondylosis (severe, L4/5)   2. Neuroforaminal stenosis of lumbar spine (L5/S1, severe  bilateral)   3. Sacroiliac joint pain   4. SI joint arthritis (HCC)   5. Spinal stenosis, lumbar region, with neurogenic claudication   6. Encounter for long-term opiate analgesic use   7. Chronic pain syndrome   8. Left hip pain   9. Hip arthritis     Controlled Controlled Controlled    Plan of Care  Assessment and Plan    Back Pain Experiencing significant back pain, particularly at night, for three weeks. The pain is diffuse and primarily right-sided, likely due to muscle strain from physical exertion. Oral steroids were chosen over injections. Start Medrol Dosepak and modify sleeping positions.  Hip Bursitis Chronic hip bursitis is preventing sleep on either side. Oral steroids are preferred initially over cortisone injections, which require approval and scheduling in 2-3 weeks. Start Medrol  Dosepak. Schedule a hip injection for next month if needed and call the clinic to activate the injection order.  Chronic Pain Management Refills for Butrans and tramadol are needed before travel. Confirmed that medications can be filled today. Send prescriptions to [facility] and pick up medications today.  Follow-up Schedule a follow-up appointment in three months. Call the clinic to request a hip injection if needed.        [name] has a current medication list which includes the following long-term medication(s): atorvastatin, calcium carbonate, famotidine, fluticasone, levalbuterol, pantoprazole, prempro, sertraline, tramadol, and tramadol.  Pharmacotherapy (Medications Ordered): Meds ordered this encounter  Medications   methylPREDNISolone (MEDROL) 4 MG TBPK tablet    Sig: Follow package instructions.    Dispense:  21 tablet    Refill:  0    Do not add to the "Automatic Refill" notification system.   traMADol (ULTRAM-ER) 100 MG 24 hr tablet    Sig: Take 1 tablet (100 mg total) by mouth at bedtime. Ok for pt to fill up to 3 days earlier than fill date.    Dispense:   30 tablet    Refill:  2   traMADol (ULTRAM) 50 MG tablet    Sig: Take 1 tablet (50 mg total) by mouth 2 (two) times daily as needed for severe pain (pain score 7-10).    Dispense:  60 tablet    Refill:  2   buprenorphine (BUTRANS) 20 MCG/HR PTWK    Sig: Place 1 patch onto the skin once a week. For chronic pain syndrome. Ok for pt to fill up to 3 days earlier than fill date.    Dispense:  4 patch    Refill:  2   Orders:  Orders Placed This Encounter  Procedures   HIP INJECTION    Standing Status:   Standing    Number of Occurrences:   2    Next Expected Occurrence:   10/01/2023    Expiration Date:   01/10/2024    Scheduling Instructions:     LEFT hip injection PRN   Follow-up plan:   Return in about 3 months (around 10/10/2023) for MM, F2F.      Bilateral SIJ/ piriformis 01/15/21       Recent Visits No visits were found meeting these conditions. Showing recent visits within past 90 days and meeting all other requirements Today's Visits Date Type Provider Dept  07/13/23 Office Visit Edward Jolly, MD [facility]  Showing today's visits and meeting all other requirements Future Appointments Date Type Provider Dept  10/05/23 Appointment Edward Jolly, MD [facility]  Showing future appointments within next 90 days and meeting all other requirements  I discussed the assessment and treatment plan with the patient. The patient was provided an opportunity to ask questions and all were answered. The patient agreed with the plan and demonstrated an understanding of the instructions.  Patient advised to call back or seek an in-person evaluation if the symptoms or condition worsens.  Duration of encounter: .  Total time on encounter, as per AMA guidelines included both the face-to-face and non-face-to-face time personally spent by the physician and/or other qualified health care professional(s) on the day of the encounter (includes time in activities that  require the physician or other qualified health care professional and does not include time in activities normally performed by clinical staff). Physician's time may include the following activities when performed: Preparing to see the patient (e.g., pre-charting review of records, searching  for previously ordered imaging, lab work, and nerve conduction tests) Review of prior analgesic pharmacotherapies. Reviewing PMP Interpreting ordered tests (e.g., lab work, imaging, nerve conduction tests) Performing post-procedure evaluations, including interpretation of diagnostic procedures Obtaining and/or reviewing separately obtained history Performing a medically appropriate examination and/or evaluation Counseling and educating the patient/family/caregiver Ordering medications, tests, or procedures Referring and communicating with other health care professionals (when not separately reported) Documenting clinical information in the electronic or other health record Independently interpreting results (not separately reported) and communicating results to the patient/ family/caregiver Care coordination (not separately reported)  Note by: Edward Jolly, MD Date: 07/13/2023; Time: 1:55 PM

## 2023-07-14 ENCOUNTER — Telehealth: Payer: Self-pay | Admitting: Student

## 2023-07-14 NOTE — Telephone Encounter (Signed)
 Contacted patient to discuss back pain and questions regarding mood medication. She will plan to start zoloft upon her return from [place]. Back pain improved with positioning as well as medrol pack. All questions answered.

## 2023-08-13 ENCOUNTER — Other Ambulatory Visit: Payer: Self-pay

## 2023-08-17 ENCOUNTER — Encounter: Payer: Self-pay | Admitting: Gastroenterology

## 2023-08-17 ENCOUNTER — Ambulatory Visit: Payer: Medicare Other | Admitting: Gastroenterology

## 2023-08-17 ENCOUNTER — Ambulatory Visit: Payer: Medicare Other | Admitting: Nurse Practitioner

## 2023-08-17 VITALS — BP 138/82 | HR 68 | Ht 63.0 in | Wt 116.5 lb

## 2023-08-17 DIAGNOSIS — K219 Gastro-esophageal reflux disease without esophagitis: Secondary | ICD-10-CM | POA: Diagnosis not present

## 2023-08-17 DIAGNOSIS — K625 Hemorrhage of anus and rectum: Secondary | ICD-10-CM

## 2023-08-17 DIAGNOSIS — K644 Residual hemorrhoidal skin tags: Secondary | ICD-10-CM | POA: Diagnosis not present

## 2023-08-17 DIAGNOSIS — K648 Other hemorrhoids: Secondary | ICD-10-CM

## 2023-08-17 DIAGNOSIS — R140 Abdominal distension (gaseous): Secondary | ICD-10-CM | POA: Diagnosis not present

## 2023-08-17 DIAGNOSIS — R143 Flatulence: Secondary | ICD-10-CM

## 2023-08-17 DIAGNOSIS — K5904 Chronic idiopathic constipation: Secondary | ICD-10-CM

## 2023-08-17 DIAGNOSIS — R142 Eructation: Secondary | ICD-10-CM | POA: Diagnosis not present

## 2023-08-17 MED ORDER — HYDROCORTISONE ACETATE 25 MG RE SUPP: 25.0000 mg | Freq: Every evening | RECTAL | 0 refills | Status: DC

## 2023-08-17 MED ORDER — IBGARD 90 MG PO CPCR: ORAL_CAPSULE | ORAL | 0 refills | Status: DC

## 2023-08-17 NOTE — Patient Instructions (Addendum)
 We have sent the following medications to your pharmacy for you to pick up at your convenience: Anusol-HC  Continue Amitiza 8 mcg po 2 tablets twice daily  Start Citrucel 1 tsp po daily for 1 week, can increase to 2 tsp po daily.  Recommend Low Fodmap diet -  We are giving you a Low-FODMAP diet handout today. FODMAPs are short-chain carbohydrates (sugars) that are highly fermentable, which means that they go through chemical changes in the GI system, and are poorly absorbed during digestion. When FODMAPs reach the colon (large intestine), bacteria ferment these sugars, turning them into gas and chemicals. This stretches the walls of the colon, causing abdominal bloating, distension, cramping, pain, and/or changes in bowel habits in many patients with IBS. FODMAPs are not unhealthy or harmful, but may exacerbate GI symptoms in those with sensitive GI tracts.  We have given you samples of the following medication to take: IBgard 2 capsules with meals for gas  We discussed small intestinal bacterial overgrowth breath test- can consider in future.  Thank you for trusting me with your gastrointestinal care!   Deanna May, NP     _______________________________________________________  If your blood pressure at your visit was 140/90 or greater, please contact your primary care physician to follow up on this.  _______________________________________________________  If you are age 27 or older, your body mass index should be between 23-30. Your Body mass index is 20.64 kg/m. If this is out of the aforementioned range listed, please consider follow up with your Primary Care Provider.  If you are age 48 or younger, your body mass index should be between 19-25. Your Body mass index is 20.64 kg/m. If this is out of the aformentioned range listed, please consider follow up with your Primary Care Provider.   ________________________________________________________  The [facility] providers would  like to encourage you to use [facility] to communicate with providers for non-urgent requests or questions.  Due to long hold times on the telephone, sending your provider a message by [facility] may be a faster and more efficient way to get a response.  Please allow 48 business hours for a response.  Please remember that this is for non-urgent requests.  _______________________________________________________

## 2023-08-17 NOTE — Progress Notes (Addendum)
 Chief Complaint:excessive gas Primary GI Doctor: Dr. Rhea Belton per patient request  HPI:  Patient is a 83 year old female patient with past medical history of high cholesterol, GERD, and anxiety, who presents for main complaint of belching and excessive gas.  Patient was last seen with [facility] in November. She was evaluated by pulmonary for shortness of breath, underwent CT chest, no evidence of interstitial lung disease, found to have gastric wall thickening, therefore referred to GI for further treatment. For frequent belching/burping episodes, she was started on pantoprazole 40 mg by her PCP about a month ago with not much relief.  04/29/23 EGD ordered. Impression:  - Normal duodenal bulb and second portion of the duodenum.  - Nodular mucosa in the gastric fundus. Biopsied.  - Congestive gastropathy. Biopsied.  - Normal gastroesophageal junction and esophagus.  - Small hiatal hernia. Path: FINAL DIAGNOSIS        1. Stomach, polyp(s), CBX fundus mucosa :       - FUNDIC GLAND POLYP(S), WITH ASSOCIATED CHRONIC INFLAMMATION       - IMMUNOHISTOCHEMICAL STAIN FOR HELICOBACTER ORGANISMS IS NEGATIVE       - NEGATIVE FOR MALIGNANCY        2. Stomach, biopsy, CBX random :       - ANTRAL AND OXYNTIC MUCOSA WITH REACTIVE CHEMICAL GASTROPATHY, ASSOCIATED       CHRONIC INFLAMMATION AND FOCALLY DILATED CAPILLARIES       - IMMUNOHISTOCHEMICAL STAIN FOR HELICOBACTER ORGANISMS IS NEGATIVE   Interval History     Patient presents today to establish care with gastroenterologist and discuss several issues.  Patient has history of GERD and currently taking Pantoprazole 40 mg po daily in the morning and Pepcid 20 mg po daily bedtime. She complains of belching for years.  She does not drink carbonated drinks, no straws, and does not eat quickly. She has been on multiple PPI's with no improvement in belching. History of Zenker's diverticulum, she had surgery approximately 1 year ago and  resolved dysphagia she had. Patient denies nausea, vomiting, or weight loss. Appetite stable.       Patient also has chronic constipation and takes Amitiza 8 mcg (2 tablets) twice daily with prune juice. She has had issues with intermittent stool leakage requiring her to wear panty liner. Also intermittent bleeding from hemorrhoids, she enquires about options for treatment. Lastly, patient complains of excessive gas over the past year. She denies dietary changes. We spent several minutes discussing gas forming foods. She has tried OTC Gas X a time or two.  Nonsmoker. Socially drinks.  Patients last colonoscopy approximately at age 63 in [facility] [place], hospital. Per patient normal exam.  Patient's family history includes mother with bladder CA, passed away at 38 years old.  Wt Readings from Last 3 Encounters:  08/17/23 116 lb 8 oz (52.8 kg)  07/13/23 120 lb (54.4 kg)  07/13/23 121 lb (54.9 kg)    Past Medical History:  Diagnosis Date   Small fiber polyneuropathy    Spondylolysis     Past Surgical History:  Procedure Laterality Date   ABDOMINAL HYSTERECTOMY     back pain     lumbar   BIOPSY  04/29/2023   Procedure: BIOPSY;  Surgeon: Toney Reil, MD;  Location: [facility] ENDOSCOPY;  Service: Gastroenterology;;  Gastric Funds and Random Gastric   COLONOSCOPY     ESOPHAGOGASTRODUODENOSCOPY (EGD) WITH PROPOFOL N/A 04/29/2023   Procedure: ESOPHAGOGASTRODUODENOSCOPY (EGD) WITH PROPOFOL;  Surgeon: Toney Reil, MD;  Location: [facility] ENDOSCOPY;  Service: Gastroenterology;  Laterality: N/A;   RADICAL VAGINAL HYSTERECTOMY  2017   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    Current Outpatient Medications  Medication Sig Dispense Refill   atorvastatin (LIPITOR) 20 MG tablet TAKE 1 TABLET BY MOUTH EVERY DAY 90 tablet 3   buprenorphine (BUTRANS) 20 MCG/HR PTWK Place 1 patch onto the skin once a week. For chronic pain syndrome. Ok for pt to fill up to 3 days earlier than fill date. 4 patch 2    Calcium Carbonate (CALCIUM 500 PO) Take one by mouth once daily.     Cholecalciferol (VITAMIN D3) 50 MCG (2000 UT) TABS Take one tablet by mouth daily.     Cyanocobalamin (VITAMIN B 12 PO) Take by mouth daily.     famotidine (PEPCID) 20 MG tablet Take 20 mg by mouth at bedtime.     fluticasone (FLONASE) 50 MCG/ACT nasal spray Place 2 sprays into both nostrils daily. 16 g 6   levalbuterol (XOPENEX HFA) 45 MCG/ACT inhaler Inhale 2 puffs into the lungs every 4 (four) hours as needed for wheezing. 1 each 2   lubiprostone (AMITIZA) 8 MCG capsule Take 2 capsules (16 mcg total) by mouth 2 (two) times daily with a meal. K59.03 360 capsule 1   Magnesium 200 MG CHEW Chew by mouth daily.     metroNIDAZOLE (METROGEL) 1 % gel APPLY TO AFFECTED AREA TOPICALLY EVERY DAY 60 g 11   Multiple Vitamins-Minerals (MULTIVITAMIN ADULT) CHEW Chew by mouth daily.     pantoprazole (PROTONIX) 40 MG tablet Take 1 tablet (40 mg total) by mouth 2 (two) times daily. 180 tablet 1   PREMPRO 0.3-1.5 MG tablet TAKE 1 TABLET BY MOUTH EVERY DAY 84 tablet 1   sertraline (ZOLOFT) 50 MG tablet Start with half tablet for 2 weeks then increase to 1 tablet daily 30 tablet 1   traMADol (ULTRAM) 50 MG tablet Take 1 tablet (50 mg total) by mouth 2 (two) times daily as needed for severe pain (pain score 7-10). 60 tablet 2   traMADol (ULTRAM-ER) 100 MG 24 hr tablet Take 1 tablet (100 mg total) by mouth at bedtime. Ok for pt to fill up to 3 days earlier than fill date. 30 tablet 2   triamcinolone cream (KENALOG) 0.1 % Apply 1 Application topically 2 (two) times daily. 30 g 0   Alpha-Lipoic Acid 600 MG CAPS Take one by mouth once daily. (Patient not taking: Reported on 08/17/2023)     Coenzyme Q10-Vitamin E (QUNOL ULTRA COQ10 PO) Take by mouth daily. (Patient not taking: Reported on 08/17/2023)     PROLIA 60 MG/ML SOSY injection Inject 60 mg into the skin as needed. Twice a year. (Patient not taking: Reported on 08/17/2023)     No current  facility-administered medications for this visit.    Allergies as of 08/17/2023 - Review Complete 08/17/2023  Allergen Reaction Noted   Sumatriptan Anxiety, Palpitations, and Other (See Comments) 09/28/2007   Penicillin g Anxiety and Other (See Comments) 09/28/2007   Topiramate Other (See Comments) 11/28/2020   Ilosone [erythromycin] Other (See Comments) 11/26/2020    Family History  Problem Relation Age of Onset   Bladder Cancer Mother    Transient ischemic attack Father    Arthritis/Rheumatoid Father    Breast cancer Neg Hx     Review of Systems:    Constitutional: No weight loss, fever, chills, weakness or fatigue HEENT: Eyes: No change in vision  Ears, Nose, Throat:  No change in hearing or congestion Skin: No rash or itching Cardiovascular: No chest pain, chest pressure or palpitations   Respiratory: No SOB or cough Gastrointestinal: See HPI and otherwise negative Genitourinary: No dysuria or change in urinary frequency Neurological: No headache, dizziness or syncope Musculoskeletal: No new muscle or joint pain Hematologic: No bleeding or bruising Psychiatric: No history of depression or anxiety    Physical Exam:  Vital signs: BP 138/82   Pulse 68   Ht 5\' 3"  (1.6 m)   Wt 116 lb 8 oz (52.8 kg)   SpO2 97%   BMI 20.64 kg/m   Constitutional:  Pleasant  female appears to be in NAD, Well developed, Well nourished, alert and cooperative Throat: Oral cavity and pharynx without inflammation, swelling or lesion.  Respiratory: Respirations even and unlabored. Lungs clear to auscultation bilaterally.   No wheezes, crackles, or rhonchi.  Cardiovascular: Normal S1, S2. Regular rate and rhythm. No peripheral edema, cyanosis or pallor.  Gastrointestinal:  Soft, nondistended, nontender. No rebound or guarding. Normal bowel sounds. No appreciable masses or hepatomegaly. Rectal: external skin tags noted, weakened rectal tone, external hemorrhoids, non-tender, no  masses, , brown stool  Anoscopy:external hemorrhoid noted Msk:  Symmetrical without gross deformities. Without edema, no deformity or joint abnormality.  Neurologic:  Alert and  oriented x4;  grossly normal neurologically.  Skin:   Dry and intact without significant lesions or rashes. Psychiatric: Oriented to person, place and time. Demonstrates good judgement and reason without abnormal affect or behaviors.  RELEVANT LABS AND IMAGING: CBC    Latest Ref Rng & Units 07/05/2023    7:29 AM 01/07/2023   11:59 AM 06/08/2022   12:00 AM  CBC  WBC 3.8 - 10.8 Thousand/uL 6.3  7.4  5.7      Hemoglobin 11.7 - 15.5 g/dL 78.2  95.6  21.3      Hematocrit 35.0 - 45.0 % 36.3  36.7  35      Platelets 140 - 400 Thousand/uL 218  213       This result is from an external source.     CMP     Latest Ref Rng & Units 07/05/2023    7:29 AM 01/07/2023   11:59 AM 06/08/2022   12:00 AM  CMP  Glucose 65 - 99 mg/dL 82  86    BUN 7 - 25 mg/dL 29  22  22       Creatinine 0.60 - 0.95 mg/dL 0.86  5.78  0.9      Sodium 135 - 146 mmol/L 137  135  141      Potassium 3.5 - 5.3 mmol/L 4.7  5.2    Chloride 98 - 110 mmol/L 102  106  105      CO2 20 - 32 mmol/L 30  24  30       Calcium 8.6 - 10.4 mg/dL 9.6  9.1  8.9      Total Protein 6.1 - 8.1 g/dL 6.4  6.9    Total Bilirubin 0.2 - 1.2 mg/dL 0.4  0.4    Alkaline Phos 38 - 126 U/L  40  37      AST 10 - 35 U/L 23  23  21       ALT 6 - 29 U/L 20  18  14          This result is from an external source.     Lab Results  Component Value Date   TSH 1.326  01/07/2023    Assessment: Encounter Diagnoses  Name Primary?   Belching Yes   Bloating    Chronic idiopathic constipation    Gastroesophageal reflux disease, unspecified whether esophagitis present    Internal hemorrhoids    Rectal bleeding      83 year old female patient who presents with several gastrointestinal issues.  Has history of GERD and currently on Pantoprazole and Pepcid which she feels controls her  symptoms the best.  Send upper GI endoscopy was negative for H. pylori.  Patient has had chronic issues with belching suspect it Lakeva Hollon be stress-induced.  She also has chronic constipation that is managed with Amitiza and prune juice.  Her concern today is with hemorrhoids that intermittently bleed and stool leakage.  We discussed starting over-the-counter Citrucel 1 teaspoon p.o. daily and titrating.  If ineffective we discussed possible biofeedback therapy.  She has external hemorrhoids on exam and we discussed how these are typically surgically removed which she does not wish to pursue.  She could try some Anusol which Samit Sylve or Kate Larock not help with the symptoms.  Agrees to give it a try.  For the excessive gas we discussed trying low FODMAP diet and Ibgard over-the-counter if no improvements Jakhi Dishman consider SIBO breath test. PT agrees to plan.  Plan: - Continue Amitiza 8 mcg po twice daily - Start Citrucel 1 tsp po daily, titrate - Recommend Low Fodmap diet  -OTC IBgard 2 capsules with meals -Anusol suppository at bedtime for  -Consider sibo test at follow-up if no improvement.  -Follow-up with Dr. Rhea Belton  in 3-4 mths  Thank you for the courtesy of this consult. Please call me with any questions or concerns.   Izaan Kingbird, FNP-C [facility] 08/17/2023, 11:22 AM  Cc: [name], MD

## 2023-08-19 ENCOUNTER — Other Ambulatory Visit: Payer: Self-pay | Admitting: Student

## 2023-08-19 DIAGNOSIS — Z1231 Encounter for screening mammogram for malignant neoplasm of breast: Secondary | ICD-10-CM

## 2023-08-20 ENCOUNTER — Encounter: Payer: Self-pay | Admitting: Student

## 2023-08-20 ENCOUNTER — Ambulatory Visit: Payer: Medicare Other | Admitting: Student

## 2023-08-20 VITALS — BP 132/86 | HR 86 | Temp 95.5°F | Ht 63.0 in | Wt 117.5 lb

## 2023-08-20 DIAGNOSIS — K5903 Drug induced constipation: Secondary | ICD-10-CM

## 2023-08-20 DIAGNOSIS — R151 Fecal smearing: Secondary | ICD-10-CM

## 2023-08-20 DIAGNOSIS — L299 Pruritus, unspecified: Secondary | ICD-10-CM

## 2023-08-20 DIAGNOSIS — K9289 Other specified diseases of the digestive system: Secondary | ICD-10-CM | POA: Diagnosis not present

## 2023-08-20 DIAGNOSIS — K649 Unspecified hemorrhoids: Secondary | ICD-10-CM

## 2023-08-20 DIAGNOSIS — M810 Age-related osteoporosis without current pathological fracture: Secondary | ICD-10-CM

## 2023-08-20 DIAGNOSIS — R490 Dysphonia: Secondary | ICD-10-CM

## 2023-08-20 NOTE — Progress Notes (Signed)
 Location:  [facility] POS: [facility] Provider: Sydnee Cabal  Code Status: Full Code Goals of Care:     07/13/2023    1:38 PM  Advanced Directives  Does Patient Have a Medical Advance Directive? Yes  Type of Estate agent of [place];Living will  Copy of Healthcare Power of Attorney in Chart? No - copy requested     Chief Complaint  Patient presents with   Medical Management of Chronic Issues    2 month follow-up. Discussed need for covid booster (declined). Discuss kenalog cream.     HPI: Patient is a 83 y.o. female seen today for medical management of chronic diseases.   Discussed the use of AI scribe software for clinical note transcription with the patient, who gave verbal consent to proceed.  History of Present Illness   Sheila Newton is an 83 year old female who presents for follow-up on her treatment regimen for gastrointestinal issues.  She has ongoing issues with a large hemorrhoid that is bleeding and causing discomfort. She uses Anusol suppositories to help shrink the hemorrhoid. She inquired about the possibility of removal, noting that if it is external, it could be tied off, but if internal, surgery might be necessary.  She is currently taking Amitiza at a dose of 16 mcg daily, split into two 8 mcg tablets taken twice a day. She initially started with 16 mcg but found it too much, so she adjusted the dose to her current regimen. She also uses Citrucel, starting with one teaspoon daily for a week, with the option to increase to two teaspoons if needed. She is also consuming prune juice to manage constipation, but is considering stopping it to better assess the effectiveness of Citrucel.  She follows a low FODMAP diet, which is new to her, and is trying to identify foods that exacerbate her symptoms, such as apples. She is considering alternatives like pink lady apples, which have lower sugar content.  She has been using IBgard for gas, taking two  capsules as a sample provided, but has not noticed a significant difference yet. She experiences stool leakage, which she hopes will be managed with Citrucel.  She has a history of a rash on her back, for which she applies a cream once daily with her husband's assistance. The rash appears to be improving, though she experiences generalized itching, particularly on her chest, and uses cortisone cream for relief.  She reports a change in her voice, describing it as 'gravelly,' but has not sought evaluation for this symptom yet.      Past Medical History:  Diagnosis Date   Small fiber polyneuropathy    Spondylolysis     Past Surgical History:  Procedure Laterality Date   ABDOMINAL HYSTERECTOMY     back pain     lumbar   BIOPSY  04/29/2023   Procedure: BIOPSY;  Surgeon: Toney Reil, MD;  Location: [facility] END[facility]COPY;  Service: Gastroenterology;;  Gastric Funds and Random Gastric   COLONOSCOPY     ESOPHAGOGASTRODUODENOSCOPY (EGD) WITH PROPOFOL N/A 04/29/2023   Procedure: ESOPHAGOGASTRODUODENOSCOPY (EGD) WITH PROPOFOL;  Surgeon: Toney Reil, MD;  Location: [facility] ENDOSCOPY;  Service: Gastroenterology;  Laterality: N/A;   RADICAL VAGINAL HYSTERECTOMY  2017   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    Allergies  Allergen Reactions   Sumatriptan Anxiety, Palpitations and Other (See Comments)    Felt weak    Penicillin G Anxiety and Other (See Comments)    Reaction unknown given a lot  of it as a child. Was told not to take anymore of it Reaction unknown given a lot of it as a child. Was told not to take anymore of it Reaction unknown given a lot of it as a child. Was told not to take anymore of it Reaction unknown given a lot of it as a child. Was told not to take anymore of it    Topiramate Other (See Comments)   Ilosone [Erythromycin] Other (See Comments)    Outpatient Encounter Medications as of 08/20/2023  Medication Sig   atorvastatin (LIPITOR) 20 MG tablet TAKE 1  TABLET BY MOUTH EVERY DAY   buprenorphine (BUTRANS) 20 MCG/HR PTWK Place 1 patch onto the skin once a week. For chronic pain syndrome. Ok for pt to fill up to 3 days earlier than fill date.   Cholecalciferol (VITAMIN D3) 50 MCG (2000 UT) TABS Take one tablet by mouth daily.   Cyanocobalamin (VITAMIN B 12 PO) Take by mouth daily.   famotidine (PEPCID) 20 MG tablet Take 20 mg by mouth at bedtime.   fluticasone (FLONASE) 50 MCG/ACT nasal spray Place 2 sprays into both nostrils daily.   hydrocortisone (ANUSOL-HC) 25 MG suppository Place 1 suppository (25 mg total) rectally at bedtime.   levalbuterol (XOPENEX HFA) 45 MCG/ACT inhaler Inhale 2 puffs into the lungs every 4 (four) hours as needed for wheezing.   lubiprostone (AMITIZA) 8 MCG capsule Take 2 capsules (16 mcg total) by mouth 2 (two) times daily with a meal. K59.03   Magnesium 200 MG CHEW Chew by mouth daily.   metroNIDAZOLE (METROGEL) 1 % gel APPLY TO AFFECTED AREA TOPICALLY EVERY DAY   Multiple Vitamins-Minerals (MULTIVITAMIN ADULT) CHEW Chew by mouth daily.   pantoprazole (PROTONIX) 40 MG tablet Take 1 tablet (40 mg total) by mouth 2 (two) times daily.   Peppermint Oil (IBGARD) 90 MG CPCR Take as directed.   sertraline (ZOLOFT) 50 MG tablet Start with half tablet for 2 weeks then increase to 1 tablet daily   traMADol (ULTRAM) 50 MG tablet Take 1 tablet (50 mg total) by mouth 2 (two) times daily as needed for severe pain (pain score 7-10).   traMADol (ULTRAM-ER) 100 MG 24 hr tablet Take 1 tablet (100 mg total) by mouth at bedtime. Ok for pt to fill up to 3 days earlier than fill date.   Zoledronic Acid (RECLAST IV) Inject into the vein. Once yearly   Alpha-Lipoic Acid 600 MG CAPS Take one by mouth once daily. (Patient not taking: Reported on 08/20/2023)   Calcium Carbonate (CALCIUM 500 PO) Take one by mouth once daily. (Patient not taking: Reported on 08/20/2023)   Coenzyme Q10-Vitamin E (QUNOL ULTRA COQ10 PO) Take by mouth daily. (Patient not  taking: Reported on 08/20/2023)   PREMPRO 0.3-1.5 MG tablet TAKE 1 TABLET BY MOUTH EVERY DAY   PROLIA 60 MG/ML SOSY injection Inject 60 mg into the skin as needed. Twice a year. (Patient not taking: Reported on 08/20/2023)   triamcinolone cream (KENALOG) 0.1 % Apply 1 Application topically 2 (two) times daily. (Patient not taking: Reported on 08/20/2023)   No facility-administered encounter medications on file as of 08/20/2023.    Review of Systems:  Review of Systems  Health Maintenance  Topic Date Due   Zoster Vaccines- Shingrix (1 of 2) 10/10/2023 (Originally 07/23/1959)   COVID-19 Vaccine (5 - 2024-25 season) 05/25/2026 (Originally 01/24/2023)   Medicare Annual Wellness (AWV)  11/10/2023   DTaP/Tdap/Td (2 - Td or Tdap) 12/20/2028   Pneumonia  Vaccine 73+ Years old  Completed   INFLUENZA VACCINE  Completed   DEXA SCAN  Completed   HPV VACCINES  Aged Out    Physical Exam: Vitals:   08/20/23 1016  BP: 132/86  Pulse: 86  Temp: (!) 95.5 F (35.3 C)  TempSrc: Temporal  SpO2: 99%  Weight: 117 lb 8 oz (53.3 kg)  Height: 5\' 3"  (1.6 m)   Body mass index is 20.81 kg/m. Physical Exam Constitutional:      Appearance: Normal appearance.  Cardiovascular:     Rate and Rhythm: Normal rate.  Pulmonary:     Effort: Pulmonary effort is normal.     Breath sounds: Normal breath sounds.  Neurological:     Mental Status: She is alert and oriented to person, place, and time.    Physical Exam   SKIN: Skin lesion on back significantly reduced in size, now localized to small areas.      Labs reviewed: Basic Metabolic Panel: Recent Labs    01/07/23 1159 07/05/23 0729  NA 135 137  K 5.2* 4.7  CL 106 102  CO2 24 30  GLUCOSE 86 82  BUN 22 29*  CREATININE 1.03* 1.15*  CALCIUM 9.1 9.6  TSH 1.326  --    Liver Function Tests: Recent Labs    01/07/23 1159 07/05/23 0729  AST 23 23  ALT 18 20  ALKPHOS 40  --   BILITOT 0.4 0.4  PROT 6.9 6.4  ALBUMIN 3.7  --    No results for  input(s): "LIPASE", "AMYLASE" in the last 8760 hours. No results for input(s): "AMMONIA" in the last 8760 hours. CBC: Recent Labs    01/07/23 1159 07/05/23 0729  WBC 7.4 6.3  NEUTROABS 5.2 3,578  HGB 11.6* 11.9  HCT 36.7 36.3  MCV 91.8 89.2  PLT 213 218   Lipid Panel: Recent Labs    07/05/23 0729  CHOL 228*  HDL 98  LDLCALC 113*  TRIG 76  CHOLHDL 2.3   Lab Results  Component Value Date   HGBA1C 5.9 (H) 07/05/2023   Assessment/Plan    Hemorrhoids She has a large hemorrhoid that is bleeding and causing discomfort. The GI specialist recommended Anusol suppositories to potentially shrink the hemorrhoid. She expressed interest in surgical intervention if the hemorrhoid is external, but was informed that surgery would be necessary if it is internal. - Use Anusol suppositories as directed  Constipation Chronic constipation managed with Amitiza and prune juice. She is currently taking Amitiza 8 mcg tablets, two tablets twice daily, and prune juice as needed. The GI specialist recommended trying Citrucel to help regulate bowel movements. She is advised to monitor the response to Citrucel and adjust prune juice intake accordingly. It is suggested to hold off on prune juice for a week or two to assess the effectiveness of Citrucel. - Continue Amitiza 8 mcg, two tablets twice daily - Start Citrucel one teaspoon daily for one week, then increase to two teaspoons if needed - Monitor response to Citrucel and adjust prune juice intake accordingly - Hold off on prune juice for a week or two to assess Citrucel effectiveness  Stool Leakage She reports stool leakage, which is a significant concern. The use of Citrucel is expected to help manage this issue by regulating stool consistency. The goal is to achieve normal bowel movements without leakage. - Continue Citrucel as directed to manage stool consistency  Dietary Management for Gastrointestinal Symptoms She was advised to follow a low  FODMAP diet to manage  gastrointestinal symptoms, including belching and gas. She is new to this diet and is learning about suitable food options. She was also advised to consider lactose-free products to manage potential lactose intolerance. The importance of following the diet 80% of the time was emphasized, allowing occasional consumption of restricted foods like apples. - Follow low FODMAP diet - Consider lactose-free products such as Lactaid milk and ice cream  Gas and Bloating She experiences gas and bloating and was given a sample of IBgard, which contains peppermint oil, to help manage these symptoms. She has only used it for two days and has not noticed a difference yet. It is suggested to continue the trial for a few weeks to assess effectiveness. - Continue IBgard trial for a few weeks  Rash on Back She has a rash on the back that is improving with the use of a cream prescribed by a dermatologist. She is applying the cream once daily with assistance from her husband. The rash has significantly reduced in size. - Continue applying the prescribed cream once daily  Generalized Itching She experiences generalized itching, particularly on the chest, and uses over-the-counter cortisone cream for relief. The itching may be related to the adhesive from a Butrin patch previously used on the back, now switched to the shoulders. - Continue using over-the-counter cortisone cream as needed  Hoarseness She reports a change in voice, described as gravelly. Acid reflux is considered a  potential cause but further evaluation is deferred for now. - Consider further evaluation if symptoms persist  Follow-up She has a follow-up appointment scheduled with Dr. Carolan Clines on June 3rd. - Attend follow-up appointment with Dr. Carolan Clines on June 3rd     Osteoporosis No longer on prolia, now on reclast  Next appt:  11/16/2023

## 2023-08-20 NOTE — Patient Instructions (Signed)
 VISIT SUMMARY:  During your visit, we discussed your ongoing gastrointestinal issues, including hemorrhoids, constipation, stool leakage, dietary management, gas and bloating, a rash on your back, generalized itching, and a change in your voice. We reviewed your current treatment regimen and made some adjustments to better manage your symptoms.  YOUR PLAN:  -HEMORRHOIDS: Hemorrhoids are swollen veins in the lower part of your rectum and anus that can cause discomfort and bleeding. Continue using Anusol suppositories as directed to help shrink the hemorrhoid.   -CONSTIPATION: Constipation is a condition where you have difficulty passing stools. Continue taking Amitiza 8 mcg tablets, two tablets twice daily. Start Citrucel with one teaspoon daily for one week, then increase to two teaspoons if needed. Hold off on prune juice for a week or two to assess the effectiveness of Citrucel.  -STOOL LEAKAGE: Stool leakage is the involuntary passage of stool. Continue using Citrucel as directed to help manage stool consistency and reduce leakage.  -DIETARY MANAGEMENT FOR GASTROINTESTINAL SYMPTOMS: Following a low FODMAP diet can help manage gastrointestinal symptoms like belching and gas. Continue with the low FODMAP diet and consider lactose-free products such as Lactaid milk and ice cream. Aim to follow the diet 80% of the time, allowing occasional consumption of restricted foods.  -GAS AND BLOATING: Gas and bloating are common digestive issues that can cause discomfort. Continue the IBgard trial for a few weeks to see if it helps manage these symptoms.  -RASH ON BACK: A rash is an area of irritated or swollen skin. Continue applying the prescribed cream once daily with your husband's assistance, as it has been improving the rash.  -GENERALIZED ITCHING: Generalized itching can be caused by various factors, including skin irritation. Continue using over-the-counter cortisone cream as needed for  relief.  -HOARSENESS: Hoarseness is a change in your voice, often described as gravelly. It may be related to acid reflux. Consider further evaluation if the symptoms persist.  INSTRUCTIONS:  Please attend your follow-up appointment with gastroenterology on June 3rd.  I'll see you in 3 months!   Best,  VB

## 2023-08-21 ENCOUNTER — Encounter: Payer: Self-pay | Admitting: Student

## 2023-08-21 DIAGNOSIS — R490 Dysphonia: Secondary | ICD-10-CM | POA: Insufficient documentation

## 2023-08-21 DIAGNOSIS — K649 Unspecified hemorrhoids: Secondary | ICD-10-CM | POA: Insufficient documentation

## 2023-08-21 DIAGNOSIS — R151 Fecal smearing: Secondary | ICD-10-CM | POA: Insufficient documentation

## 2023-08-21 DIAGNOSIS — L299 Pruritus, unspecified: Secondary | ICD-10-CM | POA: Insufficient documentation

## 2023-08-23 NOTE — Progress Notes (Signed)
 Addendum: Reviewed and agree with assessment and management plan. Asha Grumbine, Carie Caddy, MD

## 2023-08-30 ENCOUNTER — Ambulatory Visit
Admission: RE | Admit: 2023-08-30 | Discharge: 2023-08-30 | Disposition: A | Discharge status: Home / Self Care | Source: Ambulatory Visit | Attending: Student | Admitting: Student

## 2023-08-30 DIAGNOSIS — Z1231 Encounter for screening mammogram for malignant neoplasm of breast: Secondary | ICD-10-CM | POA: Diagnosis present

## 2023-09-02 ENCOUNTER — Encounter: Payer: Self-pay | Admitting: Student

## 2023-09-09 ENCOUNTER — Other Ambulatory Visit: Payer: Self-pay | Admitting: Nurse Practitioner

## 2023-09-09 DIAGNOSIS — F419 Anxiety disorder, unspecified: Secondary | ICD-10-CM

## 2023-09-14 ENCOUNTER — Other Ambulatory Visit: Payer: Self-pay

## 2023-10-04 NOTE — Progress Notes (Unsigned)
 PROVIDER NOTE: Interpretation of information contained herein should be left to medically-trained personnel. Specific patient instructions are provided elsewhere under "Patient Instructions" section of medical record. This document was created in part using AI and STT-dictation technology, any transcriptional errors that may result from this process are unintentional.  Patient: Sheila Newton  Service: E/M   PCP: Valrie Gehrig, MD  DOB: May 01, 1941  DOS: 10/05/2023  Provider: Cherylin Corrigan, NP  MRN: [id number]  Delivery: Face-to-face  Specialty: Interventional Pain Management  Type: Established Patient  Setting: Ambulatory outpatient facility  Specialty designation: 09  Referring Prov.: Valrie Gehrig, MD  Location: Outpatient office facility       HPI  [name], a 83 y.o. year old female, is here today because of her No primary diagnosis found.. Ms. Sorace primary complain today is No chief complaint on file.   Pain Assessment: Severity of   is reported as a  /10. Location:    / . Onset:  . Quality:  . Timing:  . Modifying factor(s):  Aaron Aas Vitals:  vitals were not taken for this visit.  BMI: Estimated body mass index is 20.81 kg/m as calculated from the following:   Height as of 08/20/23: 5\' 3"  (1.6 m).   Weight as of 08/20/23: 117 lb 8 oz (53.3 kg). Last encounter: 07/13/2023.  Reason for encounter: medication management. No change in medical history since last visit.  Patient's pain is at baseline.  Patient continues multimodal pain regimen as prescribed.  States that it provides pain relief and improvement in functional status.  Pharmacotherapy Assessment  Analgesic: {There is no content from the last Subjective section.}   Monitoring: [place] PMP: PDMP not reviewed this encounter.       Pharmacotherapy: No side-effects or adverse reactions reported. Compliance: No problems identified. Effectiveness: Clinically acceptable.  No notes on file  No results found for: "CBDTHCR" No results  found for: "D8THCCBX" No results found for: "D9THCCBX"  UDS:  Summary  Date Value Ref Range Status  05/12/2022 Note  Final    Comment:    ==================================================================== [facility] Select 13 (MW) ==================================================================== Test                             Result       Flag       Units  Drug Present and Declared for Prescription Verification   Buprenorphine                   35           EXPECTED   ng/mg creat   Norbuprenorphine               40           EXPECTED   ng/mg creat    Source of buprenorphine  is a scheduled prescription medication.    Norbuprenorphine is an expected metabolite of buprenorphine .    Tramadol                        >3597        EXPECTED   ng/mg creat   O-Desmethyltramadol            >3597        EXPECTED   ng/mg creat   N-Desmethyltramadol            >3597        EXPECTED   ng/mg creat    Source  of tramadol  is a prescription medication. O-desmethyltramadol    and N-desmethyltramadol are expected metabolites of tramadol .  ==================================================================== Test                      Result    Flag   Units      Ref Range   Creatinine              139              mg/dL      >=16 ==================================================================== Declared Medications:  The flagging and interpretation on this report are based on the  following declared medications.  Unexpected results may arise from  inaccuracies in the declared medications.   **Note: The testing scope of this panel includes these medications:   Tramadol  (Ultram )   **Note: The testing scope of this panel does not include small to  moderate amounts of these reported medications:   Buprenorphine  Patch (BuTrans )   **Note: The testing scope of this panel does not include the  following reported medications:   Aspirin  Atorvastatin  Cyanocobalamin  Denosumab (Prolia)   Estradiol  Fluoride (Prevident)  Lubiprostone  (Amitiza )  Magnesium  Multivitamin  Pantoprazole  (Protonix )  Ubiquinone (CoQ10)  Vitamin E ==================================================================== For clinical consultation, please call [phone]. ====================================================================       ROS  Constitutional: Denies any fever or chills Gastrointestinal: No reported hemesis, hematochezia, vomiting, or acute GI distress Musculoskeletal: Denies any acute onset joint swelling, redness, loss of ROM, or weakness Neurological: No reported episodes of acute onset apraxia, aphasia, dysarthria, agnosia, amnesia, paralysis, loss of coordination, or loss of consciousness  Medication Review  Cyanocobalamin, Magnesium, Multivitamin Adult, Peppermint Oil, Vitamin D3, Zoledronic Acid, atorvastatin, buprenorphine , estrogen (conjugated)-medroxyprogesterone, famotidine, fluticasone , hydrocortisone , levalbuterol , lubiprostone , metroNIDAZOLE , pantoprazole , sertraline , and traMADol   History Review  Allergy: Ms. Foote is allergic to sumatriptan, penicillin g, topiramate, and ilosone [erythromycin]. Drug: Ms. Grainger  reports no history of drug use. Alcohol:  reports no history of alcohol use. Tobacco:  reports that she has never smoked. She has never been exposed to tobacco smoke. She has never used smokeless tobacco. Social: Ms. Horacek  reports that she has never smoked. She has never been exposed to tobacco smoke. She has never used smokeless tobacco. She reports that she does not drink alcohol and does not use drugs. Medical:  has a past medical history of Small fiber polyneuropathy and Spondylolysis. Surgical: Ms. 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 Family: family history includes Arthritis/Rheumatoid in her father; Bladder Cancer in her mother; Transient ischemic attack in her father.  Laboratory Chemistry Profile   Renal Lab Results  Component Value Date   BUN 29 (H) 07/05/2023   CREATININE 1.15 (H) 07/05/2023   BCR 25 (H) 07/05/2023   GFRNONAA 54 (L) 01/07/2023    Hepatic Lab Results  Component Value Date   AST 23 07/05/2023   ALT 20 07/05/2023   ALBUMIN 3.7 01/07/2023   ALKPHOS 40 01/07/2023    Electrolytes Lab Results  Component Value Date   NA 137 07/05/2023   K 4.7 07/05/2023   CL 102 07/05/2023   CALCIUM 9.6 07/05/2023    Bone Lab Results  Component Value Date   VD25OH 71 07/05/2023    Inflammation (CRP: Acute Phase) (ESR: Chronic Phase) No results found for: "CRP", "ESRSEDRATE", "LATICACIDVEN"  Note: Above Lab results reviewed.  Recent Imaging Review  MM 3D SCREENING MAMMOGRAM BILATERAL BREAST CLINICAL DATA:  Screening.  EXAM: DIGITAL SCREENING BILATERAL MAMMOGRAM WITH TOMOSYNTHESIS AND CAD  TECHNIQUE: Bilateral screening digital craniocaudal and mediolateral oblique mammograms were obtained. Bilateral screening digital breast tomosynthesis was performed. The images were evaluated with computer-aided detection.  COMPARISON:  Previous exam(s).  ACR Breast Density Category b: There are scattered areas of fibroglandular density.  FINDINGS: There are no findings suspicious for malignancy.  IMPRESSION: No mammographic evidence of malignancy. A result letter of this screening mammogram will be mailed directly to the patient.  RECOMMENDATION: Screening mammogram in one year. (Code:SM-B-01Y)  BI-RADS CATEGORY  1: Negative.  Electronically Signed   By: Allena Ito M.D.   On: 09/02/2023 08:40 Note: Reviewed         Physical Exam  General appearance: Well nourished, well developed, and well hydrated. In no apparent acute distress Mental status: Alert,  oriented x 3 (person, place, & time)       Respiratory: No evidence of acute respiratory distress Eyes: PERLA Vitals: There were no vitals taken for this visit. BMI: Estimated body mass index is 20.81 kg/m as calculated from the following:   Height as of 08/20/23: 5\' 3"  (1.6 m).   Weight as of 08/20/23: 117 lb 8 oz (53.3 kg). Ideal: Patient weight not recorded  Assessment   Diagnosis Status  No diagnosis found. Controlled Controlled Controlled   Plan of Care  Assessment and Plan             Pharmacotherapy (Medications Ordered): No orders of the defined types were placed in this encounter.  Orders:  No orders of the defined types were placed in this encounter.  Follow-up plan:   No follow-ups on file.    Recent Visits Date Type Provider Dept  07/13/23 Office Visit Cephus Collin, MD [facility]  Showing recent visits within past 90 days and meeting all other requirements Future Appointments Date Type Provider Dept  10/05/23 Appointment Hillari Zumwalt K, NP [facility]  Showing future appointments within next 90 days and meeting all other requirements  I discussed the assessment and treatment plan with the patient. The patient was provided an opportunity to ask questions and all were answered. The patient agreed with the plan and demonstrated an understanding of the instructions.  Patient advised to call back or seek an in-person evaluation if the symptoms or condition worsens.  Duration of encounter: *** minutes.  Total time on encounter, as per AMA guidelines included both the face-to-face and non-face-to-face time personally spent by the physician and/or other qualified health care professional(s) on the day of the encounter (includes time in activities that require the physician or other qualified health care professional and does not include time in activities normally performed by clinical staff). Physician's time may include the following  activities when performed: Preparing to see the patient (e.g., pre-charting review of records, searching for previously ordered imaging, lab work, and nerve conduction tests) Review of prior analgesic pharmacotherapies. Reviewing PMP Interpreting ordered tests (e.g., lab work, imaging, nerve conduction tests) Performing post-procedure evaluations, including interpretation of diagnostic procedures Obtaining and/or reviewing separately obtained history Performing a medically appropriate examination and/or evaluation Counseling and educating the patient/family/caregiver Ordering medications, tests, or procedures Referring and communicating with other health care professionals (when not separately reported) Documenting clinical information in the electronic or other health record Independently interpreting results (not separately reported) and communicating results to the patient/ family/caregiver  Care coordination (not separately reported)  Note by: Shellsea Borunda K Miroslava Santellan, NP (TTS and AI technology used. I apologize for any typographical errors that were not detected and corrected.) Date: 10/05/2023; Time: 11:29 AM

## 2023-10-05 ENCOUNTER — Other Ambulatory Visit: Payer: Self-pay

## 2023-10-05 ENCOUNTER — Ambulatory Visit
Payer: Medicare Other | Attending: Student in an Organized Health Care Education/Training Program | Admitting: Nurse Practitioner

## 2023-10-05 ENCOUNTER — Encounter: Payer: Self-pay | Admitting: Nurse Practitioner

## 2023-10-05 ENCOUNTER — Other Ambulatory Visit: Payer: Self-pay | Admitting: Student

## 2023-10-05 VITALS — BP 100/80 | HR 67 | Temp 97.4°F | Ht 62.0 in | Wt 117.0 lb

## 2023-10-05 DIAGNOSIS — M48062 Spinal stenosis, lumbar region with neurogenic claudication: Secondary | ICD-10-CM | POA: Insufficient documentation

## 2023-10-05 DIAGNOSIS — M47816 Spondylosis without myelopathy or radiculopathy, lumbar region: Secondary | ICD-10-CM | POA: Insufficient documentation

## 2023-10-05 DIAGNOSIS — R232 Flushing: Secondary | ICD-10-CM

## 2023-10-05 DIAGNOSIS — Z79891 Long term (current) use of opiate analgesic: Secondary | ICD-10-CM | POA: Insufficient documentation

## 2023-10-05 DIAGNOSIS — M25552 Pain in left hip: Secondary | ICD-10-CM | POA: Insufficient documentation

## 2023-10-05 DIAGNOSIS — G894 Chronic pain syndrome: Secondary | ICD-10-CM | POA: Diagnosis present

## 2023-10-05 DIAGNOSIS — Z79899 Other long term (current) drug therapy: Secondary | ICD-10-CM | POA: Insufficient documentation

## 2023-10-05 MED ORDER — BUPRENORPHINE 20 MCG/HR TD PTWK
1.0000 | MEDICATED_PATCH | TRANSDERMAL | 2 refills | Status: DC
  Filled 2023-10-05 – 2023-10-08 (×3): qty 4, 28d supply, fill #0
  Filled 2023-11-10 – 2023-11-12 (×4): qty 4, 28d supply, fill #1
  Filled 2023-12-03 – 2023-12-13 (×2): qty 4, 28d supply, fill #2

## 2023-10-05 MED ORDER — TRAMADOL HCL 50 MG PO TABS
50.0000 mg | ORAL_TABLET | Freq: Two times a day (BID) | ORAL | 2 refills | Status: DC | PRN
  Filled 2023-10-05 – 2023-10-08 (×2): qty 60, 30d supply, fill #0
  Filled 2023-11-10: qty 60, 30d supply, fill #1
  Filled 2023-12-03 – 2023-12-13 (×2): qty 60, 30d supply, fill #2

## 2023-10-05 MED ORDER — TRAMADOL HCL ER 100 MG PO TB24
100.0000 mg | ORAL_TABLET | Freq: Every day | ORAL | 2 refills | Status: DC
  Filled 2023-10-05 – 2023-10-08 (×2): qty 30, 30d supply, fill #0
  Filled 2023-11-10: qty 30, 30d supply, fill #1
  Filled 2023-12-03 – 2023-12-13 (×2): qty 30, 30d supply, fill #2

## 2023-10-05 NOTE — Progress Notes (Signed)
 Nursing Pain Medication Assessment:  Safety precautions to be maintained throughout the outpatient stay will include: orient to surroundings, keep bed in low position, maintain call bell within reach at all times, provide assistance with transfer out of bed and ambulation.  Medication Inspection Compliance: Pill count conducted under aseptic conditions, in front of the patient. Neither the pills nor the bottle was removed from the patient's sight at any time. Once count was completed pills were immediately returned to the patient in their original bottle.  Medication #1: Buprenorphine  (Suboxone) Pill/Patch Count: 1 of 4 pills/patches remain Pill/Patch Appearance: Markings consistent with prescribed medication Bottle Appearance: Standard pharmacy container. Clearly labeled. Filled Date: 4 / 22 / 2025 Last Medication intake:  4  Medication #2: Tramadol  (Ultram ) 50mg  Pill/Patch Count: 21 Pill/Patch Appearance: Markings consistent with prescribed medication Bottle Appearance: Standard pharmacy container. Clearly labeled. Filled Date: 4 / 22 / 2025 Last Medication intake:  Today  Medication #3: Tramadol  (Ultram ) 100mg  Pill/Patch Count: 12 Pill/Patch Appearance: Markings consistent with prescribed medication Bottle Appearance: Standard pharmacy container. Clearly labeled. Filled Date: 4 / 22 / 2025 Last Medication intake:  TodaySafety precautions to be maintained throughout the outpatient stay will include: orient to surroundings, keep bed in low position, maintain call bell within reach at all times, provide assistance with transfer out of bed and ambulation.

## 2023-10-06 ENCOUNTER — Telehealth: Payer: Self-pay

## 2023-10-06 ENCOUNTER — Other Ambulatory Visit: Payer: Self-pay

## 2023-10-06 ENCOUNTER — Other Ambulatory Visit: Payer: Self-pay | Admitting: Student

## 2023-10-06 DIAGNOSIS — R232 Flushing: Secondary | ICD-10-CM

## 2023-10-06 NOTE — Telephone Encounter (Signed)
 Per chart review, prescription was received by pharmacy.

## 2023-10-06 NOTE — Telephone Encounter (Signed)
 She is going on vacation next week and her meds are due to be refilled while she is gone. They wont let her pick them up early unless they have the ok from [place]. She wants to pick them up Monday morning before she leaves to go on vacation.

## 2023-10-06 NOTE — Telephone Encounter (Signed)
 Due to be filled on 10-14-23, wants to pick up on 10-10-20, 3 days early. Do you authorize her to do that?

## 2023-10-06 NOTE — Telephone Encounter (Signed)
 Called patient and she states that the insurance will not pay for her midications if she gets them early.  Informed her that we had no control over the insurance.  Patient asked me to call pharmacy to let them know that Sheila Newton had wrote on the prescription that she could fill them 3 days early. Spoke with the pharmacist and she states that they can fill it on Friday the 16th but she did not know if the insurance would cover it or not.  Patient notified of above.

## 2023-10-06 NOTE — Telephone Encounter (Signed)
 Copied from CRM [id number]. Topic: Clinical - Medication Refill >> Oct 06, 2023  8:43 AM Sheila Newton wrote: Medication: PREMPRO  0.3-1.5 MG tablet  Has the patient contacted their pharmacy? Yes The pharmacy requested the patient call and submit for the refill   [id number] Sheila Newton [address] [place] [address] Phone: [phone] Fax: [phone]  Is this the correct pharmacy for this prescription? Yes If no, delete pharmacy and type the correct one.   Has the prescription been filled recently? Yes  Is the patient out of the medication? Yes  Has the patient been seen for an appointment in the last year OR does the patient have an upcoming appointment? Yes  Can we respond through MyChart? Yes  Agent: Please be advised that Rx refills may take up to 3 business days. We ask that you follow-up with your pharmacy.

## 2023-10-08 ENCOUNTER — Other Ambulatory Visit: Payer: Self-pay

## 2023-10-08 LAB — TOXASSURE SELECT 13 (MW), URINE

## 2023-10-22 ENCOUNTER — Encounter: Payer: Self-pay | Admitting: Pulmonary Disease

## 2023-10-22 ENCOUNTER — Ambulatory Visit (INDEPENDENT_AMBULATORY_CARE_PROVIDER_SITE_OTHER): Payer: Medicare Other | Admitting: Pulmonary Disease

## 2023-10-22 VITALS — BP 128/76 | HR 90 | Temp 98.1°F | Ht 62.0 in | Wt 114.2 lb

## 2023-10-22 DIAGNOSIS — K219 Gastro-esophageal reflux disease without esophagitis: Secondary | ICD-10-CM

## 2023-10-22 DIAGNOSIS — R1013 Epigastric pain: Secondary | ICD-10-CM | POA: Diagnosis not present

## 2023-10-22 DIAGNOSIS — K2950 Unspecified chronic gastritis without bleeding: Secondary | ICD-10-CM | POA: Diagnosis not present

## 2023-10-22 DIAGNOSIS — K449 Diaphragmatic hernia without obstruction or gangrene: Secondary | ICD-10-CM

## 2023-10-22 DIAGNOSIS — R0602 Shortness of breath: Secondary | ICD-10-CM

## 2023-10-22 NOTE — Patient Instructions (Signed)
 VISIT SUMMARY:  During your visit, we discussed your recent improvement in shortness of breath, ongoing gastrointestinal issues, and nerve pain management. Your lungs are clear, and your current medications do not seem to be contributing to your symptoms. We also reviewed your upcoming appointment with your gastroenterologist.  YOUR PLAN:  -DYSPNEA: Dyspnea means shortness of breath. Your shortness of breath has improved, and your lungs are clear. We will continue with your current management and monitor for any worsening symptoms, especially during the hot months. Please follow up in four months.  -GASTROINTESTINAL ISSUES: You have ongoing bowel problems and reduced burping, which may be linked to your shortness of breath. Please attend your upcoming appointment with Dr. Bridgett Camps for further evaluation and monitor your symptoms, reporting any changes.  -NERVE PAIN: Nerve pain is being managed with Butrans  and tramadol . These medications are primarily for your widespread nerve pain. Continue with your current pain management regimen and discuss any symptom changes with your pain management team.  INSTRUCTIONS:  Please follow up in four months for your dyspnea. Attend your upcoming appointment with Dr. Bridgett Camps for your gastrointestinal issues. Continue to monitor your symptoms and report any changes to your healthcare providers.

## 2023-10-22 NOTE — Progress Notes (Signed)
 Subjective:    Patient ID: Sheila Newton, female    DOB: [birth year], 83 y.o.   MRN: [id number]  Patient Care Team: Valrie Gehrig, MD as PCP - General (Family Medicine) Marc Senior, MD as Consulting Physician (Pulmonary Disease)  Chief Complaint  Patient presents with   Follow-up    SOB is improved.  GERD sx have gotten some better.    BACKGROUND/INTERVAL: Sheila Newton is an 83 year old lifelong never smoker with history as noted below who presents for follow-up of dyspnea and dizziness that have been present for "years".  Patient was last seen for this issue on 23 June 2023.  She has had high-resolution chest CT on 05 March 2023 that showed no evidence of fibrotic interstitial lung disease, mild air trapping indicative of small airways disease, mild cylindrical bronchiectasis and possible gastric wall thickening.  She has not had any worsening of her shortness of breath and "sighing" respirations since she was last seen. Since that visit she has had upper endoscopy.  She notices marked improvement in shortness of breath with management of gastroesophageal reflux.  HPI Discussed the use of AI scribe software for clinical note transcription with the patient, who gave verbal consent to proceed.  History of Present Illness   Sheila Newton is an 83 year old female who presents with dyspnea.  She experiences shortness of breath, which has recently improved without a clear reason. During the summer, she anticipated worsening of her dyspnea while gardening, but this has not occurred.  She is under the care of a gastroenterologist in [place] and has an upcoming appointment to address ongoing bowel issues. She describes her bowel issues as problematic and is eager for further evaluation. She notes a reduction in burping and gas, which she associates with her gastrointestinal problems.  With the reduction in burping and gas her shortness of breath has improved.  Her current medications include  Butrans  and tramadol  for pain management, primarily for nerve pain. She discussed with her pain management provider the possibility of these medications affecting her gastrointestinal motility and causing shortness of breath, but was informed it was unlikely. She has not required an increase in her medication dosage.     She does not endorse any other symptomatology today.  She has not had any fevers, chills or sweats.   Review of Systems A 10 point review of systems was performed and it is as noted above otherwise negative.   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   Dry eyes 09/02/2022   Pain management contract signed 01/07/2021   Sacroiliac joint pain 01/07/2021   SI joint arthritis (HCC) 01/07/2021   Chronic radicular lumbar pain 11/26/2020   Lumbar spondylosis 11/26/2020   Lumbar degenerative disc disease 11/26/2020   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   Encounter for long-term opiate analgesic use 11/26/2020   Spinal stenosis, lumbar region, 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/26/2020   Chronic pain syndrome 11/26/2020   Small fiber neuropathy 11/26/2020   Piriformis syndrome of both sides 11/26/2020   Neuroforaminal stenosis of lumbar spine (L5/S1, severe bilateral) 11/26/2020   Compression fracture of T12 vertebra (HCC) 06/22/2019   GERD  without esophagitis 06/13/2019   Callus of foot 10/26/2017   Great toe pain, right 10/26/2017   Hypercholesterolemia 07/20/2002   Allergic rhinitis 04/15/2001    Social History   Tobacco Use   Smoking status: Never    Passive exposure: Never   Smokeless  tobacco: Never  Substance Use Topics   Alcohol use: Never    Allergies  Allergen Reactions   Sumatriptan Anxiety, Palpitations and Other (See Comments)    Felt weak    Penicillin G Anxiety and Other (See Comments)    Reaction unknown given a lot of it as a child. Was told not to take anymore of it Reaction unknown given a lot of it as a child. Was told not to take anymore of it Reaction unknown given a lot of it as a child. Was told not to take anymore of it Reaction unknown given a lot of it as a child. Was told not to take anymore of it    Topiramate Other (See Comments)   Ilosone [Erythromycin] Other (See Comments)    Current Meds  Medication Sig   atorvastatin (LIPITOR) 20 MG tablet TAKE 1 TABLET BY MOUTH EVERY DAY   buprenorphine  (BUTRANS ) 20 MCG/HR PTWK Place 1 patch onto the skin once a week. For chronic pain syndrome. Ok for pt to fill up to 3 days earlier than fill date.   Cholecalciferol (VITAMIN D3) 50 MCG (2000 UT) TABS Take one tablet by mouth daily.   Cyanocobalamin (VITAMIN B 12 PO) Take by mouth daily.   famotidine (PEPCID) 20 MG tablet Take 20 mg by mouth at bedtime.   fluticasone  (FLONASE ) 50 MCG/ACT nasal spray Place 2 sprays into both nostrils daily.   hydrocortisone  (ANUSOL -HC) 25 MG suppository Place 1 suppository (25 mg total) rectally at bedtime. (Patient not taking: Reported on 10/26/2023)   levalbuterol  (XOPENEX  HFA) 45 MCG/ACT inhaler Inhale 2 puffs into the lungs every 4 (four) hours as needed for wheezing. (Patient not taking: Reported on 10/26/2023)   lubiprostone  (AMITIZA ) 8 MCG capsule Take 2 capsules (16 mcg total) by mouth 2 (two) times daily with a meal. K59.03   Magnesium 200 MG CHEW Chew by mouth daily.   metroNIDAZOLE  (METROGEL ) 1 % gel APPLY TO AFFECTED AREA TOPICALLY EVERY DAY   Multiple Vitamins-Minerals (MULTIVITAMIN ADULT) CHEW Chew by mouth daily.   pantoprazole  (PROTONIX ) 40 MG tablet Take 1 tablet (40 mg total) by mouth 2 (two) times daily.    Peppermint Oil (IBGARD) 90 MG CPCR Take as directed. (Patient not taking: Reported on 10/26/2023)   PREMPRO  0.3-1.5 MG tablet TAKE 1 TABLET BY MOUTH EVERY DAY   sertraline  (ZOLOFT ) 50 MG tablet Take 1 tablet (50 mg total) by mouth daily.   traMADol  (ULTRAM ) 50 MG tablet Take 1 tablet (50 mg total) by mouth 2 (two) times daily as needed for severe pain (pain score 7-10).   traMADol  (ULTRAM -ER) 100 MG 24 hr tablet Take 1 tablet (100 mg total) by mouth at bedtime. Ok for pt to fill up to 3 days earlier than fill date.   Zoledronic Acid (RECLAST IV) Inject into the vein. Once yearly    Immunization History  Administered Date(s) Administered   Fluad Quad(high Dose 65+) 02/22/2022, 03/16/2023   Influenza-Unspecified 03/27/2019, 03/25/2020   Moderna SARS-COV2 Booster Vaccination 03/01/2021, 10/21/2021   Moderna Sars-Covid-2 Vaccination 06/08/2019, 07/06/2019, 04/09/2020, 10/13/2020   Pneumococcal Conjugate,unspecified 03/08/2001, 04/23/2006   Pneumococcal Conjugate-13 02/07/2013   Pneumococcal Polysaccharide-23 04/23/2006   RSV,unspecified 04/29/2022   Tdap 12/21/2018  Zoster, Live 09/02/2005, 08/06/2017        Objective:     BP 128/76 (BP Location: Right Arm, Patient Position: Sitting, Cuff Size: Normal)   Pulse 90   Temp 98.1 F (36.7 C) (Oral)   Ht 5\' 2"  (1.575 m)   Wt 114 lb 3.2 oz (51.8 kg)   SpO2 96%   BMI 20.89 kg/m   SpO2: 96 %  GENERAL: Thin, well-developed woman, no acute distress, fully ambulatory.  No conversational dyspnea HEAD: Normocephalic, atraumatic.  EYES: Pupils equal, round, reactive to light.  No scleral icterus.  MOUTH: Dentition intact, oral mucosa moist.  No thrush. NECK: Supple. No thyromegaly. Trachea midline. No JVD.  No adenopathy. PULMONARY: Good air entry bilaterally.  No adventitious sounds. CARDIOVASCULAR: S1 and S2. Regular rate and rhythm.  No rubs, murmurs or gallops heard. ABDOMEN: Benign. MUSCULOSKELETAL: No joint deformity, no clubbing,  no edema.  NEUROLOGIC: No overt focal deficits.  Speech is fluent. SKIN: Intact,warm,dry. PSYCH: Mood and behavior normal.       Assessment & Plan:     ICD-10-CM   1. Shortness of breath  R06.02     2. 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 Dyspepsia  R10.13     4. Hiatal hernia with GERD  K44.9    K21.9      Discussion:    Dyspnea Improvement in dyspnea with possible mechanical effects due to gastrointestinal gas affecting lung expansion. No increase in Butrans  or tramadol  dosage, which could contribute to dyspnea. Lungs are clear on examination and previous scan in October showed good lung condition. - Continue current management. - Monitor for worsening symptoms, especially during hot months. - Follow-up in four months.  Gastrointestinal issues Persistent gastrointestinal issues with reduced burping but ongoing bowel problems. Potential link between gastrointestinal gas buildup and dyspnea discussed. Upcoming evaluation with Dr. Boby Bury. - Attend appointment with Dr. Boby Bury for further evaluation. - Monitor gastrointestinal symptoms and report changes.  Nerve pain Nerve pain managed with Butrans  and tramadol . Pain management primarily for widespread nerve pain. Discussion with pain management team about potential effects of medications on dyspnea and gastrointestinal motility. - Continue current pain management regimen. - Discuss symptom changes with pain management team.     Advised if symptoms do not improve or worsen, to please contact office for sooner follow up or seek emergency care.    I spent 35 minutes of dedicated to the care of this patient on the date of this encounter to include pre-visit review of records, face-to-face time with the patient discussing conditions above, post visit ordering of testing, clinical documentation with the electronic health record, making appropriate referrals as documented, and communicating necessary findings to  members of the patients care team.     C. Chloe Counter, MD Advanced Bronchoscopy PCCM [facility]    *This note was generated using voice recognition software/Dragon and/or AI transcription program.  Despite best efforts to proofread, errors can occur which can change the meaning. Any transcriptional errors that result from this process are unintentional and may not be fully corrected at the time of dictation.

## 2023-10-26 ENCOUNTER — Encounter: Payer: Self-pay | Admitting: Internal Medicine

## 2023-10-26 ENCOUNTER — Ambulatory Visit (INDEPENDENT_AMBULATORY_CARE_PROVIDER_SITE_OTHER): Admitting: Internal Medicine

## 2023-10-26 VITALS — BP 120/74 | HR 84 | Ht 61.5 in | Wt 114.2 lb

## 2023-10-26 DIAGNOSIS — K625 Hemorrhage of anus and rectum: Secondary | ICD-10-CM

## 2023-10-26 DIAGNOSIS — K5904 Chronic idiopathic constipation: Secondary | ICD-10-CM

## 2023-10-26 DIAGNOSIS — K5909 Other constipation: Secondary | ICD-10-CM

## 2023-10-26 DIAGNOSIS — R151 Fecal smearing: Secondary | ICD-10-CM

## 2023-10-26 DIAGNOSIS — R143 Flatulence: Secondary | ICD-10-CM

## 2023-10-26 DIAGNOSIS — K642 Third degree hemorrhoids: Secondary | ICD-10-CM | POA: Diagnosis not present

## 2023-10-26 NOTE — Patient Instructions (Addendum)
 Please purchase the following medications over the counter and take as directed: Citracel daily.   Another back up option would be pelvic floor therapy at [facility], [facility].  HEMORRHOID BANDING PROCEDURE    FOLLOW-UP CARE   The procedure you have had should have been relatively painless since the banding of the area involved does not have nerve endings and there is no pain sensation.  The rubber band cuts off the blood supply to the hemorrhoid and the band may fall off as soon as 48 hours after the banding (the band may occasionally be seen in the toilet bowl following a bowel movement). You may notice a temporary feeling of fullness in the rectum which should respond adequately to plain Tylenol or Motrin.  Following the banding, avoid strenuous exercise that evening and resume full activity the next day.  A sitz bath (soaking in a warm tub) or bidet is soothing, and can be useful for cleansing the area after bowel movements.     To avoid constipation, take two tablespoons of natural wheat bran, natural oat bran, flax, Benefiber or any over the counter fiber supplement and increase your water intake to 7-8 glasses daily.    Unless you have been prescribed anorectal medication, do not put anything inside your rectum for two weeks: No suppositories, enemas, fingers, etc.  Occasionally, you may have more bleeding than usual after the banding procedure.  This is often from the untreated hemorrhoids rather than the treated one.  Don't be concerned if there is a tablespoon or so of blood.  If there is more blood than this, lie flat with your bottom higher than your head and apply an ice pack to the area. If the bleeding does not stop within a half an hour or if you feel faint, call our office at [phone] or go to the emergency room.  Problems are not common; however, if there is a substantial amount of bleeding, severe pain, chills, fever or difficulty passing urine (very  rare) or other problems, you should call us  at [phone] or report to the nearest emergency room.  Do not stay seated continuously for more than 2-3 hours for a day or two after the procedure.  Tighten your buttock muscles 10-15 times every two hours and take 10-15 deep breaths every 1-2 hours.  Do not spend more than a few minutes on the toilet if you cannot empty your bowel; instead re-visit the toilet at a later time.

## 2023-10-26 NOTE — Progress Notes (Signed)
 Subjective:    Patient ID: Sheila Newton, female    DOB: May 12, 1941, 83 y.o.   MRN: [id number]  HPI Sheila Newton is an 83 year old female with a past medical history of GERD, symptomatic hemorrhoids, hyperlipidemia and anxiety who presents for follow-up.  She was seen by Edsel Grace May, NP on 08/17/2023.  She has a history of hemorrhoids that intermittently bleed and cause stool leakage. A large hemorrhoid prolapses and bleeds, especially when constipated or straining. She underwent hemorrhoid removal surgery at age 49, which required an eight-day hospital stay, and rubber band ligation approximately ten years ago, which she found helpful.  She experiences bowel leakage and uses Citrucel five days a week, although she sometimes forgets to take it daily. We discussed how the prolapsed hemorrhoid may be acting as a wick, preventing full closure of the sphincter and exacerbating the leakage.  She has a history of reflux disease and bloating, which she feels is currently under control. She was previously on Amitiza  (lubiprostone ) 16 mcg twice a day for constipation, which she takes in conjunction with her pain management medication.  Improvement in burping and bloating, but less with the lower gas symptoms.   No latex allergy.   Review of Systems As per HPI, otherwise negative  Current Medications, Allergies, Past Medical History, Past Surgical History, Family History and Social History were reviewed in [employer] record.    Objective:   Physical Exam BP 120/74 (BP Location: Left Arm, Patient Position: Sitting, Cuff Size: Normal)   Pulse 84   Ht 5' 1.5" (1.562 m) Comment: height measured without shoes  Wt 114 lb 4 oz (51.8 kg)   BMI 21.24 kg/m  Gen: awake, alert, NAD HEENT: anicteric  Rectal: No external masses or lesions, prolapsed right anterior internal hemorrhoid which reduces, grade 3, heme-negative stool no internal masses or anal fissure  Ext: no c/c/e Neuro:  nonfocal      Assessment & Plan:  83 year old female with a past medical history of GERD, symptomatic hemorrhoids, hyperlipidemia and anxiety who presents for follow-up.   Symptomatic prolapsed internal hemorrhoid with intermittent bleeding --candidate for band ligation.  We discussed the risk, benefits and alternatives and she is agreeable and wishes to proceed   PROCEDURE NOTE:  The patient presents with symptomatic grade 3 internal hemorrhoids, requesting rubber band ligation of her hemorrhoidal disease.  All risks, benefits and alternative forms of therapy were described and informed consent was obtained.   The anorectum was pre-medicated with 0.125% nitroglycerin  ointment The decision was made to band the RA internal hemorrhoid, and the [facility] System was used to perform band ligation without complication.   Digital anorectal examination was then performed to assure proper positioning of the band, and to adjust the banded tissue as required.  The patient was discharged home without pain or other issues.  Dietary and behavioral recommendations were given and along with follow-up instructions.     The patient will return as scheduled for follow-up and possible additional banding as required. No complications were encountered and the patient tolerated the procedure well.  2.  Fecal smearing --possibly related to #1 but also pelvic floor weakness -- Hemorrhoidal banding as above -- Continue Citrucel daily -- Pelvic floor physical therapy if no improvement -- We discussed InterStim but this would be a more invasive therapy for fecal incontinence which could be discussed in the future if required  3.  Chronic constipation --continue Amitiza  16 mg twice daily  30 minutes total spent today  including patient facing time, coordination of care, reviewing medical history/procedures/pertinent radiology studies, and documentation of the encounter.

## 2023-11-01 ENCOUNTER — Encounter: Payer: Self-pay | Admitting: Pulmonary Disease

## 2023-11-10 ENCOUNTER — Telehealth: Payer: Self-pay

## 2023-11-10 ENCOUNTER — Telehealth: Payer: Self-pay | Admitting: Student in an Organized Health Care Education/Training Program

## 2023-11-10 ENCOUNTER — Other Ambulatory Visit: Payer: Self-pay

## 2023-11-10 NOTE — Telephone Encounter (Signed)
 Telephone call from patient stating that she needed a PA for the Butrans  patch. She will be out of medication on Friday 11/12/23

## 2023-11-10 NOTE — Telephone Encounter (Signed)
 PT called stated that pharmacy need a PA for butrans . Please give patient a call, to let her know when PA has been send. PT stated that she need patch by Friday. TY

## 2023-11-10 NOTE — Telephone Encounter (Signed)
 PA sent. LP

## 2023-11-10 NOTE — Telephone Encounter (Signed)
 PA completed via CMM, message that pending PA exist LP Call to [facility] pharmacy

## 2023-11-11 ENCOUNTER — Telehealth: Payer: Self-pay | Admitting: Nurse Practitioner

## 2023-11-11 ENCOUNTER — Telehealth: Payer: Self-pay

## 2023-11-11 ENCOUNTER — Telehealth: Payer: Self-pay | Admitting: Student in an Organized Health Care Education/Training Program

## 2023-11-11 ENCOUNTER — Other Ambulatory Visit: Payer: Self-pay

## 2023-11-11 NOTE — Telephone Encounter (Signed)
 Call to patient to give her an update on the  PA for butrans  patch. Informed her that her pharmacy stated they needed an appeal from us . Appeal sent today around noon. Informed her to give it time for her insurance to review and hoepfully approve soon. Encourage her to give her pharmacy a call tomorrow so they can give her an update. She states her pharmacy is closed for the weekend so she hopes it gets resolved before the weekend. She is due for a new patch placement tomorrow.   Patient verbalized understanding.

## 2023-11-11 NOTE — Telephone Encounter (Signed)
 Telephone call from patient stating she needs a PA for her Butran patches. She called the insurance and the PA that was submitted was denied for missing information. Patient will be out of meds tomorrow.

## 2023-11-11 NOTE — Telephone Encounter (Signed)
 Return phone call to [facility] and spoke with rep for PA on burtans patch. She states they called us  to let us  know they received the appeal and may take up to 72 hours. They did not need any further information.  [facility] faxed information. Avanell Bob, RN filled out and faxed application back at [phone].   Called patient and informed her of the update.

## 2023-11-11 NOTE — Telephone Encounter (Signed)
 Called patient's pharmacy, [facility], and spoke with tech regarding an update on Butrans  patch PA. They states they needed an appeal. Appeal done today not too long ago. Pharmacy tech will update us  if needed.

## 2023-11-11 NOTE — Telephone Encounter (Signed)
 Appeal sent.

## 2023-11-11 NOTE — Telephone Encounter (Signed)
 Pharmacy tech called stated that they have some questions for patient patches. PLS give pharmacy a call. TY

## 2023-11-12 ENCOUNTER — Other Ambulatory Visit: Payer: Self-pay

## 2023-11-15 ENCOUNTER — Other Ambulatory Visit: Payer: Self-pay | Admitting: Student

## 2023-11-15 DIAGNOSIS — K5903 Drug induced constipation: Secondary | ICD-10-CM

## 2023-11-16 ENCOUNTER — Ambulatory Visit: Payer: Medicare Other | Admitting: Nurse Practitioner

## 2023-11-16 ENCOUNTER — Encounter: Payer: Self-pay | Admitting: Nurse Practitioner

## 2023-11-16 VITALS — BP 126/76 | HR 90 | Temp 97.6°F | Resp 17 | Ht 61.8 in | Wt 113.6 lb

## 2023-11-16 DIAGNOSIS — E2839 Other primary ovarian failure: Secondary | ICD-10-CM | POA: Diagnosis not present

## 2023-11-16 DIAGNOSIS — Z0000 Encounter for general adult medical examination without abnormal findings: Secondary | ICD-10-CM

## 2023-11-16 NOTE — Progress Notes (Signed)
 Subjective:   Sheila Newton is a 83 y.o. female who presents for Medicare Annual (Subsequent) preventive examination.  Visit Complete: In person [facility]   Cardiac Risk Factors include: advanced age (>78men, >56 women);sedentary lifestyle;dyslipidemia     Objective:    Today's Vitals   11/16/23 1017 11/16/23 1027  BP: 126/76   Pulse: 90   Resp: 17   Temp: 97.6 F (36.4 C)   SpO2: 96%   Weight: 113 lb 9.6 oz (51.5 kg)   Height: 5' 1.8 (1.57 m)   PainSc:  2    Body mass index is 20.91 kg/m.     11/16/2023   10:05 AM 10/05/2023   10:30 AM 07/13/2023    1:38 PM 04/29/2023   12:06 PM 04/21/2023    1:35 PM 04/13/2023    1:31 PM 03/12/2023    3:56 PM  Advanced Directives  Does Patient Have a Medical Advance Directive? Yes Yes Yes Yes Yes Yes Yes  Type of Estate agent of [place];Living will Healthcare Power of [place];Living will Healthcare Power of [place];Living will Healthcare Power of [place];Living will Healthcare Power of [place];Living will Healthcare Power of [place];Living will Healthcare Power of [place];Out of facility DNR (pink MOST or yellow form);Living will  Does patient want to make changes to medical advance directive? No - Patient declined    No - Patient declined  No - Patient declined  Copy of Healthcare Power of Attorney in Chart? No - copy requested  No - copy requested No - copy requested No - copy requested  No - copy requested    Current Medications (verified) Outpatient Encounter Medications as of 11/16/2023  Medication Sig   atorvastatin (LIPITOR) 20 MG tablet TAKE 1 TABLET BY MOUTH EVERY DAY   buprenorphine  (BUTRANS ) 20 MCG/HR PTWK Place 1 patch onto the skin once a week. For chronic pain syndrome. Ok for pt to fill up to 3 days earlier than fill date.   Cholecalciferol (VITAMIN D3) 50 MCG (2000 UT) TABS Take one tablet by mouth daily.   Cyanocobalamin (VITAMIN B 12 PO) Take by mouth daily.   famotidine (PEPCID) 20  MG tablet Take 20 mg by mouth at bedtime.   fluticasone  (FLONASE ) 50 MCG/ACT nasal spray Place 2 sprays into both nostrils daily.   lubiprostone  (AMITIZA ) 8 MCG capsule TAKE 2 CAPSULES (16 MCG TOTAL) BY MOUTH 2 (TWO) TIMES DAILY WITH A MEAL. K59.03   Magnesium 200 MG CHEW Chew by mouth daily.   metroNIDAZOLE  (METROGEL ) 1 % gel APPLY TO AFFECTED AREA TOPICALLY EVERY DAY   Multiple Vitamins-Minerals (MULTIVITAMIN ADULT) CHEW Chew by mouth daily.   pantoprazole  (PROTONIX ) 40 MG tablet Take 1 tablet (40 mg total) by mouth 2 (two) times daily.   PREMPRO  0.3-1.5 MG tablet TAKE 1 TABLET BY MOUTH EVERY DAY   sertraline  (ZOLOFT ) 50 MG tablet Take 1 tablet (50 mg total) by mouth daily.   traMADol  (ULTRAM ) 50 MG tablet Take 1 tablet (50 mg total) by mouth 2 (two) times daily as needed for severe pain (pain score 7-10).   traMADol  (ULTRAM -ER) 100 MG 24 hr tablet Take 1 tablet (100 mg total) by mouth at bedtime. Ok for pt to fill up to 3 days earlier than fill date.   Zoledronic Acid (RECLAST IV) Inject into the vein. Once yearly   hydrocortisone  (ANUSOL -HC) 25 MG suppository Place 1 suppository (25 mg total) rectally at bedtime. (Patient not taking: Reported on 11/16/2023)   levalbuterol  (XOPENEX  HFA) 45 MCG/ACT inhaler  Inhale 2 puffs into the lungs every 4 (four) hours as needed for wheezing. (Patient not taking: Reported on 11/16/2023)   Peppermint Oil (IBGARD) 90 MG CPCR Take as directed. (Patient not taking: Reported on 11/16/2023)   No facility-administered encounter medications on file as of 11/16/2023.    Allergies (verified) Sumatriptan, Penicillin g, Topiramate, and Ilosone [erythromycin]   History: Past Medical History:  Diagnosis Date   Small fiber polyneuropathy    Spondylolysis    Past Surgical History:  Procedure Laterality Date   ABDOMINAL HYSTERECTOMY     back pain     lumbar   BIOPSY  04/29/2023   Procedure: BIOPSY;  Surgeon: Unk Corinn Skiff, MD;  Location: [facility] ENDOSCOPY;   Service: Gastroenterology;;  Gastric Funds and Random Gastric   COLONOSCOPY     ESOPHAGOGASTRODUODENOSCOPY (EGD) WITH PROPOFOL  N/A 04/29/2023   Procedure: ESOPHAGOGASTRODUODENOSCOPY (EGD) WITH PROPOFOL ;  Surgeon: Unk Corinn Skiff, MD;  Location: [facility] ENDOSCOPY;  Service: Gastroenterology;  Laterality: N/A;   RADICAL VAGINAL HYSTERECTOMY  2017   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   Family History  Problem Relation Age of Onset   Bladder Cancer Mother    Transient ischemic attack Father    Arthritis/Rheumatoid Father    Breast cancer Neg Hx    Social History   Socioeconomic History   Marital status: Married    Spouse name: Not on file   Number of children: Not on file   Years of education: Not on file   Highest education level: Bachelor's degree (e.g., BA, AB, BS)  Occupational History   Not on file  Tobacco Use   Smoking status: Never    Passive exposure: Never   Smokeless tobacco: Never  Vaping Use   Vaping status: Never Used  Substance and Sexual Activity   Alcohol use: Never   Drug use: Never   Sexual activity: Not on file  Other Topics Concern   Not on file  Social History Narrative   Not on file   Social Drivers of Health   Financial Resource Strain: Low Risk  (09/28/2022)   Overall Financial Resource Strain (CARDIA)    Difficulty of Paying Living Expenses: Not hard at all  Food Insecurity: No Food Insecurity (11/16/2023)   Hunger Vital Sign    Worried About Running Out of Food in the Last Year: Never true    Ran Out of Food in the Last Year: Never true  Transportation Needs: No Transportation Needs (11/16/2023)   PRAPARE - Administrator, Civil Service (Medical): No    Lack of Transportation (Non-Medical): No  Physical Activity: Insufficiently Active (09/28/2022)   Exercise Vital Sign    Days of Exercise per Week: 3 days    Minutes of Exercise per Session: 30 min  Stress: No Stress Concern Present (09/28/2022)   [employer] of  Occupational Health - Occupational Stress Questionnaire    Feeling of Stress : Not at all  Social Connections: Socially Integrated (09/28/2022)   Social Connection and Isolation Panel    Frequency of Communication with Friends and Family: More than three times a week    Frequency of Social Gatherings with Friends and Family: Twice a week    Attends Religious Services: 1 to 4 times per year    Active Member of [employer] or Organizations: Yes    Attends Banker Meetings: Not on file    Marital Status: Married    Tobacco Counseling Counseling given: Not Answered   Clinical Intake:  Consulting civil engineer  completed: Yes  Pain : 0-10 Pain Score: 2  Pain Type: Chronic pain, Neuropathic pain Pain Location:  (all over, feet and legs pain)     BMI - recorded: 20.91 Nutritional Risks: None  How often do you need to have someone help you when you read instructions, pamphlets, or other written materials from your doctor or pharmacy?: 1 - Never         Activities of Daily Living    11/16/2023   10:10 AM  In your present state of health, do you have any difficulty performing the following activities:  Hearing? 1  Vision? 0  Difficulty concentrating or making decisions? 0  Walking or climbing stairs? 0  Dressing or bathing? 0  Doing errands, shopping? 0  Preparing Food and eating ? N  Using the Toilet? N  In the past six months, have you accidently leaked urine? N  Do you have problems with loss of bowel control? Y  Managing your Medications? N  Managing your Finances? N  Housekeeping or managing your Housekeeping? N    Patient Care Team: Abdul Fine, MD as PCP - General (Family Medicine) Tamea Dedra CROME, MD as Consulting Physician (Pulmonary Disease)  Indicate any recent Medical Services you may have received from other than [facility] providers in the past year (date may be approximate).     Assessment:   This is a routine wellness examination for  Jeanita.  Hearing/Vision screen Hearing Screening - Comments:: Some hearing issues pt wear hearing aids hearing exam 2 weeks ago ENT in [place]. Vision Screening - Comments:: Eye exam oct.2024 no issues (pt can't remember practice)   Goals Addressed   None    Depression Screen    11/16/2023   10:08 AM 10/05/2023   10:30 AM 07/13/2023    1:38 PM 07/13/2023    8:41 AM 04/21/2023    1:34 PM 12/09/2022    2:59 PM 11/10/2022    9:44 AM  PHQ 2/9 Scores  PHQ - 2 Score 0 0 0 0 0 0 0    Fall Risk    11/16/2023   10:08 AM 10/05/2023   10:30 AM 07/13/2023    1:38 PM 07/13/2023    8:41 AM 06/23/2023    9:45 AM  Fall Risk   Falls in the past year? 0 0 0 0 0  Number falls in past yr: 0  0 0   Injury with Fall? 0  0 0   Risk for fall due to : No Fall Risks   No Fall Risks   Follow up Falls evaluation completed   Falls evaluation completed     MEDICARE RISK AT HOME: Medicare Risk at Home Any stairs in or around the home?: Yes If so, are there any without handrails?: No Home free of loose throw rugs in walkways, pet beds, electrical cords, etc?: Yes Adequate lighting in your home to reduce risk of falls?: Yes Life alert?: No Use of a cane, walker or w/c?: No Grab bars in the bathroom?: Yes Shower chair or bench in shower?: Yes Elevated toilet seat or a handicapped toilet?: Yes  TIMED UP AND GO:  Was the test performed?  No    Cognitive Function:        11/16/2023   10:09 AM 11/10/2022    9:44 AM  6CIT Screen  What Year? 0 points 0 points  What month? 0 points 0 points  What time? 0 points 0 points  Count back from 20 0 points 0  points  Months in reverse 0 points 0 points  Repeat phrase 0 points 0 points  Total Score 0 points 0 points    Immunizations Immunization History  Administered Date(s) Administered   Fluad Quad(high Dose 65+) 02/22/2022, 03/16/2023   Influenza-Unspecified 03/27/2019, 03/25/2020   Moderna SARS-COV2 Booster Vaccination 03/01/2021, 10/21/2021    Moderna Sars-Covid-2 Vaccination 06/08/2019, 07/06/2019, 04/09/2020, 10/13/2020   Pneumococcal Conjugate,unspecified 03/08/2001, 04/23/2006   Pneumococcal Conjugate-13 02/07/2013   Pneumococcal Polysaccharide-23 04/23/2006   RSV,unspecified 04/29/2022   Tdap 12/21/2018   Zoster Recombinant(Shingrix) 05/25/2016, 05/25/2017   Zoster, Live 09/02/2005, 08/06/2017    TDAP status: Up to date  Flu Vaccine status: Up to date  Pneumococcal vaccine status: Up to date  Covid-19 vaccine status: Information provided on how to obtain vaccines.   Qualifies for Shingles Vaccine? Yes   Zostavax completed No   Shingrix Completed?: No.    Education has been provided regarding the importance of this vaccine. Patient has been advised to call insurance company to determine out of pocket expense if they have not yet received this vaccine. Advised may also receive vaccine at local pharmacy or Health Dept. Verbalized acceptance and understanding.  Screening Tests Health Maintenance  Topic Date Due   COVID-19 Vaccine (5 - 2024-25 season) 05/25/2026 (Originally 01/24/2023)   INFLUENZA VACCINE  12/24/2023   Medicare Annual Wellness (AWV)  11/15/2024   DEXA SCAN  09/28/2025   DTaP/Tdap/Td (2 - Td or Tdap) 12/20/2028   Pneumococcal Vaccine: 50+ Years  Completed   Zoster Vaccines- Shingrix  Completed   Hepatitis B Vaccines  Aged Out   HPV VACCINES  Aged Out   Meningococcal B Vaccine  Aged Out    Health Maintenance  There are no preventive care reminders to display for this patient.   Colorectal cancer screening: No longer required.   Mammogram status: No longer required due to age.  Bone Density status: Ordered today. Pt provided with contact info and advised to call to schedule appt.  Lung Cancer Screening: (Low Dose CT Chest recommended if Age 67-80 years, 20 pack-year currently smoking OR have quit w/in 15years.) does not qualify.   Lung Cancer Screening Referral: na  Additional  Screening:  Hepatitis C Screening: does not qualify  Vision Screening: Recommended annual ophthalmology exams for early detection of glaucoma and other disorders of the eye. Is the patient up to date with their annual eye exam?  Yes  Who is the provider or what is the name of the office in which the patient attends annual eye exams? [facility] If pt is not established with a provider, would they like to be referred to a provider to establish care? No .   Dental Screening: Recommended annual dental exams for proper oral hygiene   Community Resource Referral / Chronic Care Management: CRR required this visit?  No   CCM required this visit?  No     Plan:     I have personally reviewed and noted the following in the patient's chart:   Medical and social history Use of alcohol, tobacco or illicit drugs  Current medications and supplements including opioid prescriptions. Patient is not currently taking opioid prescriptions. Functional ability and status Nutritional status Physical activity Advanced directives List of other physicians Hospitalizations, surgeries, and ER visits in previous 12 months Vitals Screenings to include cognitive, depression, and falls Referrals and appointments  In addition, I have reviewed and discussed with patient certain preventive protocols, quality metrics, and best practice recommendations. A written personalized care  plan for preventive services as well as general preventive health recommendations were provided to patient.     Harlene MARLA An, NP   11/16/2023

## 2023-11-17 ENCOUNTER — Ambulatory Visit: Admitting: Student

## 2023-11-17 ENCOUNTER — Encounter: Payer: Self-pay | Admitting: Student

## 2023-11-17 VITALS — BP 118/72 | HR 91 | Temp 97.2°F | Ht 63.0 in | Wt 114.0 lb

## 2023-11-17 DIAGNOSIS — R151 Fecal smearing: Secondary | ICD-10-CM | POA: Diagnosis not present

## 2023-11-17 DIAGNOSIS — K649 Unspecified hemorrhoids: Secondary | ICD-10-CM | POA: Diagnosis not present

## 2023-11-17 DIAGNOSIS — R0602 Shortness of breath: Secondary | ICD-10-CM

## 2023-11-17 DIAGNOSIS — L299 Pruritus, unspecified: Secondary | ICD-10-CM | POA: Diagnosis not present

## 2023-11-17 MED ORDER — CETIRIZINE HCL 10 MG PO TABS: 5.0000 mg | ORAL_TABLET | Freq: Every day | ORAL | 11 refills | Status: AC

## 2023-11-17 NOTE — Patient Instructions (Signed)
 VISIT SUMMARY:  During today's visit, we discussed your recent shortness of breath, bowel issues, chronic itching, and hemorrhoids. We reviewed your symptoms and made adjustments to your treatment plan to help manage these conditions more effectively.  YOUR PLAN:  -SHORTNESS OF BREATH: Your shortness of breath, which worsens in heat and when bending over, may be due to environmental factors or potential allergies. We will start you on a low-dose Zyrtec (5 mg) once daily to see if it helps improve your symptoms.  -CHRONIC ITCHING: Your chronic itching, especially on your upper body, may be related to dry skin or the adhesive from your Butrans  patch. We recommend applying Cetaphil moisturizer daily, especially after showering, and using steroid cream occasionally for localized itching. We will also start you on a low-dose Zyrtec (5 mg) once daily to see if it helps with the itching.  -BOWEL LEAKAGE: Your ongoing bowel leakage is being managed with Citrucel and Amortiza. A specialist has recommended pelvic floor physical therapy, which you will start after the summer. Continue taking Citrucel daily and adjust the Amortiza dose as needed based on your symptoms.  -HEMORRHOIDS: You recently had a large hemorrhoid removed, which provided significant relief. Another hemorrhoid is present and may be addressed in future consultations.  INSTRUCTIONS:  Please follow up with the recommended pelvic floor physical therapy after the summer. Continue taking Citrucel daily and adjust the Amortiza dose as needed. Start taking Zyrtec (5 mg) once daily to help with your shortness of breath and itching. Apply Cetaphil moisturizer daily, especially after showering, and use steroid cream occasionally for localized itching. If you have any new or worsening symptoms, please contact our office.

## 2023-11-17 NOTE — Progress Notes (Signed)
 [facility]: [facility] Provider: ABDUL  Code Status: Full Code Goals of Care:     11/16/2023   10:05 AM  Advanced Directives  Does Patient Have a Medical Advance Directive? Yes  Type of Estate agent of [place];Living will  Does patient want to make changes to medical advance directive? No - Patient declined  Copy of Healthcare Power of Attorney in Chart? No - copy requested     Chief Complaint  Patient presents with  . Medical Management of Chronic Issues    Medical Management of Chronic Issues. 3 Month follow up    HPI: Patient is a 83 y.o. female seen today for medical management of chronic diseases.   Discussed the use of AI scribe software for clinical note transcription with the patient, who gave verbal consent to proceed.  History of Present Illness   Sheila Newton is an 83 year old female who presents with shortness of breath and bowel issues.  She has been experiencing shortness of breath, initially controlled during the winter and spring, but has recurred over the past three days, particularly when bending over or in hot and humid conditions. An episode today involved worsening shortness of breath after bending over while watering plants, followed by an upset stomach lasting about an hour and a half. The shortness of breath is more pronounced in the heat and when bending over.  She has a history of bowel leakage and hemorrhoids. Recently, a large hemorrhoid was removed, providing significant relief. Despite this, she continues to experience bowel leakage and takes Citrucel daily. She also uses Amitiza , adjusting the dose between two to four times a day, depending on her symptoms.  She has been experiencing generalized itching for years, initially attributed to the adhesive of her Butrans  patch. She has tried changing the location of the patch and using steroid cream for a rash on her back, described as eczema. She uses Cetaphil  moisturizer after showering but not consistently on her back. She has changed her laundry detergent to Cleocin Clear.  No allergies have been reported in the past. She uses Pepcid daily, which she finds helpful. She denies using Xopenex  or peppermint oil recently and has not used Anusol  suppositories since they were prescribed for a short duration.         Past Medical History:  Diagnosis Date  . Small fiber polyneuropathy   . Spondylolysis     Past Surgical History:  Procedure Laterality Date  . ABDOMINAL HYSTERECTOMY    . back pain     lumbar  . BIOPSY  04/29/2023   Procedure: BIOPSY;  Surgeon: Unk Corinn Skiff, MD;  Location: [facility] ENDOSCOPY;  Service: Gastroenterology;;  Gastric Funds and Random Gastric  . COLONOSCOPY    . ESOPHAGOGASTRODUODENOSCOPY (EGD) WITH PROPOFOL  N/A 04/29/2023   Procedure: ESOPHAGOGASTRODUODENOSCOPY (EGD) WITH PROPOFOL ;  Surgeon: Unk Corinn Skiff, MD;  Location: [facility] ENDOSCOPY;  Service: Gastroenterology;  Laterality: N/A;  . RADICAL VAGINAL HYSTERECTOMY  2017  . 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 Sumatriptan Anxiety, Palpitations and Other (See Comments)    Felt weak   . Penicillin G Anxiety and Other (See Comments)    Reaction unknown given a lot of it as a child. Was told not to take anymore of it Reaction unknown given a lot of it as a child. Was told not to take anymore of it Reaction unknown given a lot of it as a  child. Was told not to take anymore of it Reaction unknown given a lot of it as a child. Was told not to take anymore of it   . Topiramate Other (See Comments)  . Ilosone [Erythromycin] Other (See Comments)    Outpatient Encounter Medications as of 11/17/2023  Medication Sig  . atorvastatin (LIPITOR) 20 MG tablet TAKE 1 TABLET BY MOUTH EVERY DAY  . buprenorphine  (BUTRANS ) 20 MCG/HR PTWK Place 1 patch onto the skin once a week. For chronic pain syndrome. Ok for pt to fill up to 3 days  earlier than fill date.  . cetirizine (ZYRTEC) 10 MG tablet Take 0.5 tablets (5 mg total) by mouth daily.  . Cholecalciferol (VITAMIN D3) 50 MCG (2000 UT) TABS Take one tablet by mouth daily.  . Cyanocobalamin (VITAMIN B 12 PO) Take by mouth daily.  . famotidine (PEPCID) 20 MG tablet Take 20 mg by mouth at bedtime.  . fluticasone  (FLONASE ) 50 MCG/ACT nasal spray Place 2 sprays into both nostrils daily.  . lubiprostone  (AMITIZA ) 8 MCG capsule TAKE 2 CAPSULES (16 MCG TOTAL) BY MOUTH 2 (TWO) TIMES DAILY WITH A MEAL. K59.03  . Magnesium 200 MG CHEW Chew by mouth daily.  . metroNIDAZOLE  (METROGEL ) 1 % gel APPLY TO AFFECTED AREA TOPICALLY EVERY DAY  . Multiple Vitamins-Minerals (MULTIVITAMIN ADULT) CHEW Chew by mouth daily.  . pantoprazole  (PROTONIX ) 40 MG tablet Take 1 tablet (40 mg total) by mouth 2 (two) times daily.  . PREMPRO  0.3-1.5 MG tablet TAKE 1 TABLET BY MOUTH EVERY DAY  . psyllium (HYDROCIL/METAMUCIL) 95 % PACK Take 1 packet by mouth daily.  . sertraline  (ZOLOFT ) 50 MG tablet Take 1 tablet (50 mg total) by mouth daily.  . traMADol  (ULTRAM ) 50 MG tablet Take 1 tablet (50 mg total) by mouth 2 (two) times daily as needed for severe pain (pain score 7-10).  . traMADol  (ULTRAM -ER) 100 MG 24 hr tablet Take 1 tablet (100 mg total) by mouth at bedtime. Ok for pt to fill up to 3 days earlier than fill date.  . Zoledronic Acid (RECLAST IV) Inject into the vein. Once yearly  . [DISCONTINUED] hydrocortisone  (ANUSOL -HC) 25 MG suppository Place 1 suppository (25 mg total) rectally at bedtime. (Patient not taking: Reported on 11/17/2023)  . [DISCONTINUED] levalbuterol  (XOPENEX  HFA) 45 MCG/ACT inhaler Inhale 2 puffs into the lungs every 4 (four) hours as needed for wheezing. (Patient not taking: Reported on 11/17/2023)  . [DISCONTINUED] Peppermint Oil (IBGARD) 90 MG CPCR Take as directed. (Patient not taking: Reported on 11/17/2023)   No facility-administered encounter medications on file as of 11/17/2023.     Review of Systems:  Review of Systems  Health Maintenance  Topic Date Due  . COVID-19 Vaccine (5 - 2024-25 season) 05/25/2026 (Originally 01/24/2023)  . INFLUENZA VACCINE  12/24/2023  . Medicare Annual Wellness (AWV)  11/15/2024  . DEXA SCAN  09/28/2025  . DTaP/Tdap/Td (2 - Td or Tdap) 12/20/2028  . Pneumococcal Vaccine: 50+ Years  Completed  . Zoster Vaccines- Shingrix  Completed  . Hepatitis B Vaccines  Aged Out  . HPV VACCINES  Aged Out  . Meningococcal B Vaccine  Aged Out    Physical Exam: Vitals:   11/17/23 1257  BP: 118/72  Pulse: 91  Temp: (!) 97.2 F (36.2 C)  SpO2: 97%  Weight: 114 lb (51.7 kg)  Height: 5' 3 (1.6 m)   Body mass index is 20.19 kg/m. Physical Exam Constitutional:      Appearance: Normal appearance.   Cardiovascular:  Rate and Rhythm: Normal rate and regular rhythm.     Pulses: Normal pulses.     Heart sounds: Normal heart sounds.  Pulmonary:     Effort: Pulmonary effort is normal.  Abdominal:     General: Abdomen is flat. Bowel sounds are normal.     Palpations: Abdomen is soft.   Musculoskeletal:        General: No swelling or tenderness.   Skin:    General: Skin is warm and dry.   Neurological:     Mental Status: She is alert and oriented to person, place, and time.     Gait: Gait normal.   Psychiatric:        Mood and Affect: Mood normal.     Labs reviewed: Basic Metabolic Panel: Recent Labs    01/07/23 1159 07/05/23 0729  NA 135 137  K 5.2* 4.7  CL 106 102  CO2 24 30  GLUCOSE 86 82  BUN 22 29*  CREATININE 1.03* 1.15*  CALCIUM 9.1 9.6  TSH 1.326  --    Liver Function Tests: Recent Labs    01/07/23 1159 07/05/23 0729  AST 23 23  ALT 18 20  ALKPHOS 40  --   BILITOT 0.4 0.4  PROT 6.9 6.4  ALBUMIN 3.7  --    No results for input(s): LIPASE, AMYLASE in the last 8760 hours. No results for input(s): AMMONIA in the last 8760 hours. CBC: Recent Labs    01/07/23 1159 07/05/23 0729  WBC 7.4 6.3   NEUTROABS 5.2 3,578  HGB 11.6* 11.9  HCT 36.7 36.3  MCV 91.8 89.2  PLT 213 218   Lipid Panel: Recent Labs    07/05/23 0729  CHOL 228*  HDL 98  LDLCALC 113*  TRIG 76  CHOLHDL 2.3   Lab Results  Component Value Date   HGBA1C 5.9 (H) 07/05/2023    Procedures since last visit: No results found. Results   PATHOLOGY Hemorrhoidectomy: One large hemorrhoid removed (10/26/2023)      Assessment/Plan    Shortness of breath Intermittent dyspnea exacerbated by heat, humidity, and bending over, worsening over the past three days. Differential includes environmental factors such as humidity and potential allergic reactions. No allergies reported, but considering an allergic component. Discussed trial of Zyrtec, a non-sedating antihistamine, to alleviate symptoms without sedation or other side effects associated with older antihistamines like Benadryl. - Initiate trial of low-dose Zyrtec (5 mg) once daily to assess for symptom improvement.  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 Previous use of steroid cream for eczema-like rash on back. Inconsistent moisturizing regimen. Emphasized importance of regular moisturizing and potential benefit of Zyrtec for pruritus. - Apply Cetaphil moisturizer to all areas of the skin daily, especially post-shower. - Use steroid cream periodically for localized pruritus, avoiding daily use to prevent skin thinning. - Initiate trial of low-dose Zyrtec (5 mg) once daily to assess for improvement in pruritus.  Bowel leakage Chronic bowel leakage with ongoing management. Recent specialist consultation recommended pelvic floor physical therapy for symptom relief. Currently taking Citrucel and Amortiza with variable dosing based on symptom severity. Specialist recommended a specific physical therapist for pelvic floor therapy, planned to start after summer. - Continue  Citrucel daily. - Adjust Amortiza dosing as needed for symptom control. - Initiate pelvic floor physical therapy after summer, as recommended by specialist.  Hemorrhoids Recent removal of a large hemorrhoid with significant symptomatic relief. Another hemorrhoid is  present and may be addressed in future consultations.          Labs/tests ordered:  * No order type specified * Next appt:  02/18/2024

## 2023-11-30 ENCOUNTER — Other Ambulatory Visit: Payer: Self-pay

## 2023-11-30 ENCOUNTER — Telehealth: Payer: Self-pay

## 2023-11-30 NOTE — Telephone Encounter (Signed)
 She is going on vacation and her butrane patches will run out before she gets back. Can she get an early fill? Please call her

## 2023-11-30 NOTE — Telephone Encounter (Signed)
 Spoke with patient and her pharmacy. Per [facility] pharmacy, verbal order was given for patient to pick up prescription sooner on 12/03/23 as patient leaves for vacation on 12/05/23. Patient verbalized understanding.

## 2023-12-03 ENCOUNTER — Other Ambulatory Visit: Payer: Self-pay

## 2023-12-09 ENCOUNTER — Other Ambulatory Visit: Payer: Self-pay | Admitting: Student

## 2023-12-09 ENCOUNTER — Other Ambulatory Visit: Payer: Self-pay

## 2023-12-09 DIAGNOSIS — K219 Gastro-esophageal reflux disease without esophagitis: Secondary | ICD-10-CM

## 2023-12-13 ENCOUNTER — Other Ambulatory Visit: Payer: Self-pay

## 2023-12-28 ENCOUNTER — Other Ambulatory Visit: Payer: Self-pay

## 2023-12-28 ENCOUNTER — Ambulatory Visit: Attending: Nurse Practitioner | Admitting: Nurse Practitioner

## 2023-12-28 ENCOUNTER — Encounter: Payer: Self-pay | Admitting: Nurse Practitioner

## 2023-12-28 VITALS — BP 135/72 | HR 91 | Temp 97.3°F | Resp 16 | Ht 62.0 in | Wt 115.0 lb

## 2023-12-28 DIAGNOSIS — M48062 Spinal stenosis, lumbar region with neurogenic claudication: Secondary | ICD-10-CM

## 2023-12-28 DIAGNOSIS — M47816 Spondylosis without myelopathy or radiculopathy, lumbar region: Secondary | ICD-10-CM | POA: Diagnosis not present

## 2023-12-28 DIAGNOSIS — Z79899 Other long term (current) drug therapy: Secondary | ICD-10-CM

## 2023-12-28 DIAGNOSIS — G894 Chronic pain syndrome: Secondary | ICD-10-CM | POA: Diagnosis present

## 2023-12-28 DIAGNOSIS — M25552 Pain in left hip: Secondary | ICD-10-CM | POA: Diagnosis present

## 2023-12-28 DIAGNOSIS — M48061 Spinal stenosis, lumbar region without neurogenic claudication: Secondary | ICD-10-CM

## 2023-12-28 DIAGNOSIS — Z79891 Long term (current) use of opiate analgesic: Secondary | ICD-10-CM | POA: Diagnosis present

## 2023-12-28 MED ORDER — BUPRENORPHINE 20 MCG/HR TD PTWK
1.0000 | MEDICATED_PATCH | TRANSDERMAL | 2 refills | Status: DC
  Filled 2023-12-28 – 2024-01-08 (×2): qty 4, 28d supply, fill #0
  Filled 2024-02-07: qty 4, 28d supply, fill #1
  Filled 2024-03-06: qty 4, 28d supply, fill #2

## 2023-12-28 MED ORDER — TRAMADOL HCL ER 100 MG PO TB24
100.0000 mg | ORAL_TABLET | Freq: Every day | ORAL | 2 refills | Status: DC
  Filled 2023-12-28 – 2024-01-08 (×2): qty 30, 30d supply, fill #0
  Filled 2024-02-07: qty 30, 30d supply, fill #1
  Filled 2024-03-06 – 2024-03-07 (×2): qty 30, 30d supply, fill #2

## 2023-12-28 MED ORDER — TRAMADOL HCL 50 MG PO TABS
50.0000 mg | ORAL_TABLET | Freq: Two times a day (BID) | ORAL | 2 refills | Status: DC | PRN
  Filled 2023-12-28 – 2024-01-08 (×2): qty 60, 30d supply, fill #0
  Filled 2024-02-07: qty 60, 30d supply, fill #1
  Filled 2024-03-06 – 2024-03-07 (×2): qty 60, 30d supply, fill #2

## 2023-12-28 NOTE — Progress Notes (Signed)
 Nursing Pain Medication Assessment:  Safety precautions to be maintained throughout the outpatient stay will include: orient to surroundings, keep bed in low position, maintain call bell within reach at all times, provide assistance with transfer out of bed and ambulation.  Medication Inspection Compliance: Pill count conducted under aseptic conditions, in front of the patient. Neither the pills nor the bottle was removed from the patient's sight at any time. Once count was completed pills were immediately returned to the patient in their original bottle.  Medication #1: Tramadol  (Ultram ) 50 mg Pill/Patch Count: 25 of 60 pills/patches remain Pill/Patch Appearance: Markings consistent with prescribed medication Bottle Appearance: Standard pharmacy container. Clearly labeled. Filled Date: 7 / 21 / 2025 Last Medication intake:  Today  Medication #2: Tramadol  (Ultram ) 100 mg Pill/Patch Count: 12 of 30 pills/patches remain Pill/Patch Appearance: Markings consistent with prescribed medication Bottle Appearance: Standard pharmacy container. Clearly labeled. Filled Date: 7 / 21 / 2025 Last Medication intake:  Today  Butrans  patches 20mcg/hr 1/4 patches remain Filled 12/13/2023 Last patch applied 12/27/2023

## 2023-12-28 NOTE — Progress Notes (Signed)
 PROVIDER NOTE: Interpretation of information contained herein should be left to medically-trained personnel. Specific patient instructions are provided elsewhere under Patient Instructions section of medical record. This document was created in part using AI and STT-dictation technology, any transcriptional errors that may result from this process are unintentional.  Patient: Sheila Newton  Service: E/M   PCP: Abdul Fine, MD  DOB: [birth year]  DOS: 12/28/2023  Provider: Emmy MARLA Blanch, NP  MRN: [id number]  Delivery: Face-to-face  Specialty: Interventional Pain Management  Type: Established Patient  Setting: Ambulatory outpatient facility  Specialty designation: 09  Referring Prov.: Abdul Fine, MD  Location: Outpatient office facility       History of present illness (HPI) Sheila Newton, a 83 y.o. year old female, is here today because of her Medication management [Z79.899]. Sheila Newton primary complain today is Pain (generalized)  Pertinent problems: Sheila Newton has Chronic radicular lumbar pain; Lumbar spondylosis; Lumbar degenerative disc disease; Pudendal neuralgia; Encounter for long-term opiate analgesic use; Spinal stenosis, lumbar region, with neurogenic claudication; Chronic pain syndrome; Small fiber neuropathy; Piriformis syndrome of both sides; Neuroforaminal stenosis of lumbar spine (L5/S1, severe bilateral); and SI joint arthritis (HCC) on their pertinent problem list  Pain Assessment: Severity of Chronic pain is reported as a 2 /10. Location: Generalized  / . Onset: More than a month ago. Quality: Burning, Stabbing, Other (Comment) (itching). Timing: Constant. Modifying factor(s): meds. Vitals:  height is 5' 2 (1.575 m) and weight is 115 lb (52.2 kg). Her temperature is 97.3 F (36.3 C) (abnormal). Her blood pressure is 135/72 and her pulse is 91. Her respiration is 16 and oxygen saturation is 100%.  BMI: Estimated body mass index is 21.03 kg/m as calculated from the following:    Height as of this encounter: 5' 2 (1.575 m).   Weight as of this encounter: 115 lb (52.2 kg).  Last encounter: 10/05/2023. Last procedure: Visit date not found.  Reason for encounter: medication management. No change in medical history since last visit.  Patient's pain is at baseline.  Patient continues multimodal pain regimen as prescribed.  States that it provides pain relief and improvement in functional status.  She complains of generalized itching and expressed concern related to the Butrans  patch; however I explained that she can apply Eucerin cream or lotion around the area to help relieve the itching.  Pharmacotherapy Assessment   Tramadol  (Ultram ) 50 mg tablet 2 times daily as needed for severe pain. MME=20 Tramadol  (Ultram -ER) 100 mg 24-hour tablet at bedtime. MME=20 Butrans  20 mcg/h 1 patch onto the skin once a week Monitoring: [place] PMP: PDMP reviewed during this encounter.       Pharmacotherapy: No side-effects or adverse reactions reported. Compliance: No problems identified. Effectiveness: Clinically acceptable.  Dayna Pulling, RN  12/28/2023 10:17 AM  Sign when Signing Visit Nursing Pain Medication Assessment:  Safety precautions to be maintained throughout the outpatient stay will include: orient to surroundings, keep bed in low position, maintain call bell within reach at all times, provide assistance with transfer out of bed and ambulation.  Medication Inspection Compliance: Pill count conducted under aseptic conditions, in front of the patient. Neither the pills nor the bottle was removed from the patient's sight at any time. Once count was completed pills were immediately returned to the patient in their original bottle.  Medication #1: Tramadol  (Ultram ) 50 mg Pill/Patch Count: 25 of 60 pills/patches remain Pill/Patch Appearance: Markings consistent with prescribed medication Bottle Appearance: Standard pharmacy container. Clearly labeled. Filled Date: 7 /  21 / 2025 Last  Medication intake:  Today  Medication #2: Tramadol  (Ultram ) 100 mg Pill/Patch Count: 12 of 30 pills/patches remain Pill/Patch Appearance: Markings consistent with prescribed medication Bottle Appearance: Standard pharmacy container. Clearly labeled. Filled Date: 7 / 21 / 2025 Last Medication intake:  Today  Butrans  patches 20mcg/hr 1/4 patches remain Filled 12/13/2023 Last patch applied 12/27/2023    UDS:  Summary  Date Value Ref Range Status  10/05/2023 FINAL  Final    Comment:    ==================================================================== [employer] Select 13 (MW) ==================================================================== Test                             Result       Flag       Units  Drug Present and Declared for Prescription Verification   Buprenorphine                   40           EXPECTED   ng/mg creat   Norbuprenorphine               47           EXPECTED   ng/mg creat    Source of buprenorphine  is a scheduled prescription medication.    Norbuprenorphine is an expected metabolite of buprenorphine .    Tramadol                        >3247        EXPECTED   ng/mg creat   O-Desmethyltramadol            >3247        EXPECTED   ng/mg creat   N-Desmethyltramadol            >3247        EXPECTED   ng/mg creat    Source of tramadol  is a prescription medication. O-desmethyltramadol    and N-desmethyltramadol are expected metabolites of tramadol .  ==================================================================== Test                      Result    Flag   Units      Ref Range   Creatinine              154              mg/dL      >=79 ==================================================================== Declared Medications:  The flagging and interpretation on this report are based on the  following declared medications.  Unexpected results may arise from  inaccuracies in the declared medications.   **Note: The testing scope of this panel includes these  medications:   Tramadol  (Ultram )   **Note: The testing scope of this panel does not include small to  moderate amounts of these reported medications:   Buprenorphine  Patch (BuTrans )   **Note: The testing scope of this panel does not include the  following reported medications:   Atorvastatin (Lipitor)  Cyanocobalamin  Estrogens (Prempro )  Famotidine (Pepcid)  Fluticasone  (Flonase )  Hydrocortisone   Levalbuterol  (Xopenex )  Lubiprostone  (Amitiza )  Magnesium  Medroxyprogesterone (Prempro )  Metronidazole  (MetroGel )  Multivitamin  Pantoprazole  (Protonix )  Sertraline  (Zoloft )  Vitamin D3  Zoledronic Acid ==================================================================== For clinical consultation, please call [phone]. ====================================================================     No results found for: CBDTHCR No results found for: D8THCCBX No results found for: D9THCCBX  ROS  Constitutional: Denies any fever or chills Gastrointestinal: No reported  hemesis, hematochezia, vomiting, or acute GI distress Musculoskeletal: Generalized pain Neurological: No reported episodes of acute onset apraxia, aphasia, dysarthria, agnosia, amnesia, paralysis, loss of coordination, or loss of consciousness  Medication Review  Cyanocobalamin, Magnesium, Multivitamin Adult, Vitamin D3, Zoledronic Acid, atorvastatin, buprenorphine , cetirizine , estrogen (conjugated)-medroxyprogesterone, famotidine, fluticasone , lubiprostone , metroNIDAZOLE , pantoprazole , psyllium, sertraline , and traMADol   History Review  Allergy: Sheila Newton is allergic to sumatriptan, penicillin g, topiramate, and ilosone [erythromycin]. Drug: Sheila Newton  reports no history of drug use. Alcohol:  reports no history of alcohol use. Tobacco:  reports that she has never smoked. She has never been exposed to tobacco smoke. She has never used smokeless tobacco. Social: Sheila Newton  reports that she has never  smoked. She has never been exposed to tobacco smoke. She has never used smokeless tobacco. She reports that she does not drink alcohol and does not use drugs. Medical:  has a past medical history of Small fiber polyneuropathy and Spondylolysis. 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 Family: family history includes Arthritis/Rheumatoid in her father; Bladder Cancer in her mother; Transient ischemic attack in her father.  Laboratory Chemistry Profile   Renal Lab Results  Component Value Date   BUN 29 (H) 07/05/2023   CREATININE 1.15 (H) 07/05/2023   BCR 25 (H) 07/05/2023   GFRNONAA 54 (L) 01/07/2023    Hepatic Lab Results  Component Value Date   AST 23 07/05/2023   ALT 20 07/05/2023   ALBUMIN 3.7 01/07/2023   ALKPHOS 40 01/07/2023    Electrolytes Lab Results  Component Value Date   NA 137 07/05/2023   K 4.7 07/05/2023   CL 102 07/05/2023   CALCIUM 9.6 07/05/2023    Bone Lab Results  Component Value Date   VD25OH 71 07/05/2023    Inflammation (CRP: Acute Phase) (ESR: Chronic Phase) No results found for: CRP, ESRSEDRATE, LATICACIDVEN       Note: Above Lab results reviewed.  Recent Imaging Review  MM 3D SCREENING MAMMOGRAM BILATERAL BREAST CLINICAL DATA:  Screening.  EXAM: DIGITAL SCREENING BILATERAL MAMMOGRAM WITH TOMOSYNTHESIS AND CAD  TECHNIQUE: Bilateral screening digital craniocaudal and mediolateral oblique mammograms were obtained. Bilateral screening digital breast tomosynthesis was performed. The images were evaluated with computer-aided detection.  COMPARISON:  Previous exam(s).  ACR Breast Density Category b: There are scattered areas of fibroglandular density.  FINDINGS: There are no findings suspicious  for malignancy.  IMPRESSION: No mammographic evidence of malignancy. A result letter of this screening mammogram will be mailed directly to the patient.  RECOMMENDATION: Screening mammogram in one year. (Code:SM-B-01Y)  BI-RADS CATEGORY  1: Negative.  Electronically Signed   By: Inocente Ast M.D.   On: 09/02/2023 08:40 Note: Reviewed        Physical Exam  Vitals: BP 135/72   Pulse 91   Temp (!) 97.3 F (36.3 C)   Resp 16   Ht 5' 2 (1.575 m)   Wt 115 lb (52.2 kg)   SpO2 100%   BMI 21.03 kg/m  BMI: Estimated body mass index is 21.03 kg/m as calculated from the following:   Height as of this encounter: 5' 2 (1.575 m).   Weight as of this encounter: 115 lb (52.2 kg). Ideal: Ideal body weight: 50.1 kg (110 lb 7.2 oz) Adjusted ideal body weight: 50.9 kg (112 lb 4.3 oz) General appearance: Well nourished, well  developed, and well hydrated. In no apparent acute distress Mental status: Alert, oriented x 3 (person, place, & time)       Respiratory: No evidence of acute respiratory distress Eyes: PERLA   Assessment   Diagnosis Status  1. Medication management   2. Lumbar spondylosis (severe, L4/5)   3. Spinal stenosis, lumbar region, with neurogenic claudication   4. Encounter for long-term opiate analgesic use   5. Chronic pain syndrome   6. Left hip pain   7. Neuroforaminal stenosis of lumbar spine (L5/S1, severe bilateral)    Controlled Controlled Controlled   Updated Problems: No problems updated.  Plan of Care  Problem-specific:  Assessment and Plan We will continue on current medication regimen.  Prescribing drug monitoring (PDMP) reviewed; consistent with the use of prescribed medication and no evidence of narcotic misuse or abuse.  Urine drug screening up-to-date.  No other concerns or issues related to this visit.  Schedule follow-up in 90 days for medication management.  Sheila Newton has a current medication list which includes the following  long-term medication(s): atorvastatin, cetirizine , famotidine, fluticasone , pantoprazole , prempro , sertraline , tramadol , and tramadol .  Pharmacotherapy (Medications Ordered): Meds ordered this encounter  Medications   buprenorphine  (BUTRANS ) 20 MCG/HR PTWK    Sig: Place 1 patch onto the skin once a week. For chronic pain syndrome. Ok for pt to fill up to 3 days earlier than fill date.    Dispense:  4 patch    Refill:  2   traMADol  (ULTRAM ) 50 MG tablet    Sig: Take 1 tablet (50 mg total) by mouth 2 (two) times daily as needed for severe pain (pain score 7-10).    Dispense:  60 tablet    Refill:  2   traMADol  (ULTRAM -ER) 100 MG 24 hr tablet    Sig: Take 1 tablet (100 mg total) by mouth at bedtime. Ok for pt to fill up to 3 days earlier than fill date.    Dispense:  30 tablet    Refill:  2   Orders:  No orders of the defined types were placed in this encounter.       Return in about 3 months (around 03/29/2024) for (F2F), (MM), Emmy Blanch NP.    Recent Visits Date Type Provider Dept  10/05/23 Office Visit Jeffery Bachmeier K, NP [facility]  Showing recent visits within past 90 days and meeting all other requirements Today's Visits Date Type Provider Dept  12/28/23 Office Visit Miia Blanks K, NP [facility]  Showing today's visits and meeting all other requirements Future Appointments No visits were found meeting these conditions. Showing future appointments within next 90 days and meeting all other requirements  I discussed the assessment and treatment plan with the patient. The patient was provided an opportunity to ask questions and all were answered. The patient agreed with the plan and demonstrated an understanding of the instructions.  Patient advised to call back or seek an in-person evaluation if the symptoms or condition worsens.  Duration of encounter: 30 minutes.  Total time on encounter, as per AMA guidelines included both the face-to-face and  non-face-to-face time personally spent by the physician and/or other qualified health care professional(s) on the day of the encounter (includes time in activities that require the physician or other qualified health care professional and does not include time in activities normally performed by clinical staff). Physician's time may include the following activities when performed: Preparing to see the patient (e.g., pre-charting review of  records, searching for previously ordered imaging, lab work, and nerve conduction tests) Review of prior analgesic pharmacotherapies. Reviewing PMP Interpreting ordered tests (e.g., lab work, imaging, nerve conduction tests) Performing post-procedure evaluations, including interpretation of diagnostic procedures Obtaining and/or reviewing separately obtained history Performing a medically appropriate examination and/or evaluation Counseling and educating the patient/family/caregiver Ordering medications, tests, or procedures Referring and communicating with other health care professionals (when not separately reported) Documenting clinical information in the electronic or other health record Independently interpreting results (not separately reported) and communicating results to the patient/ family/caregiver Care coordination (not separately reported)  Note by: Harly Pipkins K Stefanny Pieri, NP (TTS and AI technology used. I apologize for any typographical errors that were not detected and corrected.) Date: 12/28/2023; Time: 11:47 AM

## 2024-01-05 ENCOUNTER — Encounter: Admitting: Nurse Practitioner

## 2024-01-07 ENCOUNTER — Other Ambulatory Visit: Payer: Self-pay

## 2024-01-09 ENCOUNTER — Other Ambulatory Visit: Payer: Self-pay

## 2024-01-10 ENCOUNTER — Other Ambulatory Visit: Payer: Self-pay

## 2024-01-17 ENCOUNTER — Encounter: Payer: Self-pay | Admitting: Internal Medicine

## 2024-01-17 ENCOUNTER — Ambulatory Visit (INDEPENDENT_AMBULATORY_CARE_PROVIDER_SITE_OTHER): Admitting: Internal Medicine

## 2024-01-17 VITALS — BP 132/70 | HR 82 | Ht 61.0 in | Wt 112.2 lb

## 2024-01-17 DIAGNOSIS — R143 Flatulence: Secondary | ICD-10-CM

## 2024-01-17 DIAGNOSIS — K648 Other hemorrhoids: Secondary | ICD-10-CM

## 2024-01-17 DIAGNOSIS — R151 Fecal smearing: Secondary | ICD-10-CM

## 2024-01-17 DIAGNOSIS — K5909 Other constipation: Secondary | ICD-10-CM

## 2024-01-17 DIAGNOSIS — R142 Eructation: Secondary | ICD-10-CM | POA: Diagnosis not present

## 2024-01-17 DIAGNOSIS — K5904 Chronic idiopathic constipation: Secondary | ICD-10-CM

## 2024-01-17 DIAGNOSIS — K642 Third degree hemorrhoids: Secondary | ICD-10-CM | POA: Diagnosis not present

## 2024-01-17 NOTE — Patient Instructions (Signed)
 Please purchase the following medications over the counter and take as directed: Phazyme daily.   HEMORRHOID BANDING PROCEDURE    FOLLOW-UP CARE   The procedure you have had should have been relatively painless since the banding of the area involved does not have nerve endings and there is no pain sensation.  The rubber band cuts off the blood supply to the hemorrhoid and the band may fall off as soon as 48 hours after the banding (the band may occasionally be seen in the toilet bowl following a bowel movement). You may notice a temporary feeling of fullness in the rectum which should respond adequately to plain Tylenol or Motrin.  Following the banding, avoid strenuous exercise that evening and resume full activity the next day.  A sitz bath (soaking in a warm tub) or bidet is soothing, and can be useful for cleansing the area after bowel movements.     To avoid constipation, take two tablespoons of natural wheat bran, natural oat bran, flax, Benefiber or any over the counter fiber supplement and increase your water intake to 7-8 glasses daily.    Unless you have been prescribed anorectal medication, do not put anything inside your rectum for two weeks: No suppositories, enemas, fingers, etc.  Occasionally, you may have more bleeding than usual after the banding procedure.  This is often from the untreated hemorrhoids rather than the treated one.  Don't be concerned if there is a tablespoon or so of blood.  If there is more blood than this, lie flat with your bottom higher than your head and apply an ice pack to the area. If the bleeding does not stop within a half an hour or if you feel faint, call our office at [phone] or go to the emergency room.  Problems are not common; however, if there is a substantial amount of bleeding, severe pain, chills, fever or difficulty passing urine (very rare) or other problems, you should call us  at [phone] or report to the nearest emergency  room.  Do not stay seated continuously for more than 2-3 hours for a day or two after the procedure.  Tighten your buttock muscles 10-15 times every two hours and take 10-15 deep breaths every 1-2 hours.  Do not spend more than a few minutes on the toilet if you cannot empty your bowel; instead re-visit the toilet at a later time.

## 2024-01-17 NOTE — Progress Notes (Signed)
 Sheila Newton is an 83 year old female with a history of GERD, symptomatic hemorrhoids with prolapse, fecal smearing, chronic constipation who is here for follow-up.  She is here alone today and was initially seen on 10/26/2023 in the right anterior internal hemorrhoid was banded.  Symptoms prior to banding included fecal smearing, bleeding and prolapse  She also reports ongoing issues with burping and flatulence without bloating.  She is continued Citrucel 2 teaspoons daily which has been effective.  She is having some fecal smearing but not as bad.  Response to hemorrhoidal banding has been very good and she is very pleased.  Much less bleeding and prolapse   Assessment and plan  1.  Internal hemorrhoids with bleeding and prolapse  PROCEDURE NOTE:  The patient presents with symptomatic grade 3 internal hemorrhoids, requesting rubber band ligation of his her hemorrhoidal disease.  All risks, benefits and alternative forms of therapy were described and informed consent was obtained.   The anorectum was pre-medicated with 0.125% nitroglycerin  ointment The decision was made to band the LL (RA banded at visit #1) internal hemorrhoid, and the [facility] [place] System was used to perform band ligation without complication.   Digital anorectal examination was then performed to assure proper positioning of the band, and to adjust the banded tissue as required.  The patient was discharged home without pain or other issues.  Dietary and behavioral recommendations were given and along with follow-up instructions.     The following adjunctive treatments were recommended: Continue Citrucel 2 teaspoons daily, could go to twice daily if needed Continue Amitiza  16 mg twice daily for chronic constipation  The patient will return as scheduled for follow-up and possible additional banding as required. No complications were encountered and the patient tolerated the procedure well.  2.  Belching and  flatulence --Trial of Phazyme over-the-counter maximum strength daily per box instruction --SIBO test if persistent despite Phazyme  3.  Chronic constipation --Continuing Amitiza  16 mg twice daily  4.  Fecal smearing --hopefully will improve with banding and Citrucel --Pelvic floor physical therapy if persistent symptoms after banding and maximizing fiber intake

## 2024-02-01 ENCOUNTER — Other Ambulatory Visit: Payer: Self-pay

## 2024-02-07 ENCOUNTER — Other Ambulatory Visit: Payer: Self-pay

## 2024-02-18 ENCOUNTER — Ambulatory Visit: Admitting: Student

## 2024-02-21 ENCOUNTER — Other Ambulatory Visit: Payer: Self-pay

## 2024-02-23 ENCOUNTER — Ambulatory Visit: Admitting: Pulmonary Disease

## 2024-02-24 ENCOUNTER — Ambulatory Visit: Admitting: Nurse Practitioner

## 2024-03-06 ENCOUNTER — Encounter: Payer: Self-pay | Admitting: Internal Medicine

## 2024-03-06 ENCOUNTER — Other Ambulatory Visit: Payer: Self-pay

## 2024-03-07 ENCOUNTER — Other Ambulatory Visit: Payer: Self-pay

## 2024-03-13 ENCOUNTER — Other Ambulatory Visit: Payer: Self-pay | Admitting: Student

## 2024-03-13 DIAGNOSIS — F419 Anxiety disorder, unspecified: Secondary | ICD-10-CM

## 2024-03-13 DIAGNOSIS — R232 Flushing: Secondary | ICD-10-CM

## 2024-03-13 MED ORDER — SERTRALINE HCL 50 MG PO TABS
50.0000 mg | ORAL_TABLET | Freq: Every day | ORAL | 1 refills | Status: AC
Start: 2024-03-13 — End: ?

## 2024-03-13 MED ORDER — PREMPRO 0.3-1.5 MG PO TABS: 1.0000 | ORAL_TABLET | Freq: Every day | ORAL | 1 refills | Status: AC

## 2024-03-13 NOTE — Telephone Encounter (Unsigned)
 Copied from CRM #[id number]. Topic: Clinical - Medication Refill >> Mar 13, 2024  9:01 AM Antonio H wrote: Medication: sertraline  (ZOLOFT ) 50 MG tablet PREMPRO  0.3-1.5 MG tablet  Has new PCP but can't get in until December. Going out of town and needing medications by Wednesday   Has the patient contacted their pharmacy? Yes (Agent: If no, request that the patient contact the pharmacy for the refill. If patient does not wish to contact the pharmacy document the reason why and proceed with request.) (Agent: If yes, when and what did the pharmacy advise?) was told to contact provider   [id number] [place], [place] - [address] [place] [address] Phone: [phone] Fax: [phone]  Is this the correct pharmacy for this prescription? Yes If no, delete pharmacy and type the correct one.   Has the prescription been filled recently? No  Is the patient out of the medication? No, has 1-2 left   Has the patient been seen for an appointment in the last year OR does the patient have an upcoming appointment? Yes  Can we respond through MyChart? No  Agent: Please be advised that Rx refills may take up to 3 business days. We ask that you follow-up with your pharmacy.

## 2024-03-14 ENCOUNTER — Encounter: Payer: Self-pay | Admitting: Internal Medicine

## 2024-03-14 ENCOUNTER — Other Ambulatory Visit

## 2024-03-14 ENCOUNTER — Ambulatory Visit (INDEPENDENT_AMBULATORY_CARE_PROVIDER_SITE_OTHER): Admitting: Internal Medicine

## 2024-03-14 VITALS — BP 124/68 | HR 85 | Ht 62.0 in | Wt 112.2 lb

## 2024-03-14 DIAGNOSIS — R159 Full incontinence of feces: Secondary | ICD-10-CM

## 2024-03-14 DIAGNOSIS — K641 Second degree hemorrhoids: Secondary | ICD-10-CM | POA: Diagnosis not present

## 2024-03-14 DIAGNOSIS — K648 Other hemorrhoids: Secondary | ICD-10-CM

## 2024-03-14 DIAGNOSIS — R5383 Other fatigue: Secondary | ICD-10-CM

## 2024-03-14 DIAGNOSIS — R140 Abdominal distension (gaseous): Secondary | ICD-10-CM

## 2024-03-14 DIAGNOSIS — R141 Gas pain: Secondary | ICD-10-CM

## 2024-03-14 DIAGNOSIS — K5909 Other constipation: Secondary | ICD-10-CM

## 2024-03-14 DIAGNOSIS — R151 Fecal smearing: Secondary | ICD-10-CM

## 2024-03-14 DIAGNOSIS — K638219 Small intestinal bacterial overgrowth, unspecified: Secondary | ICD-10-CM

## 2024-03-14 DIAGNOSIS — R143 Flatulence: Secondary | ICD-10-CM

## 2024-03-14 LAB — CBC WITH DIFFERENTIAL/PLATELET
Basophils Absolute: 0 K/uL (ref 0.0–0.1)
Basophils Relative: 0.6 % (ref 0.0–3.0)
Eosinophils Absolute: 0.2 K/uL (ref 0.0–0.7)
Eosinophils Relative: 2.6 % (ref 0.0–5.0)
HCT: 35.4 % — ABNORMAL LOW (ref 36.0–46.0)
Hemoglobin: 11.4 g/dL — ABNORMAL LOW (ref 12.0–15.0)
Lymphocytes Relative: 21.1 % (ref 12.0–46.0)
Lymphs Abs: 1.3 K/uL (ref 0.7–4.0)
MCHC: 32.3 g/dL (ref 30.0–36.0)
MCV: 89 fl (ref 78.0–100.0)
Monocytes Absolute: 0.5 K/uL (ref 0.1–1.0)
Monocytes Relative: 8.4 % (ref 3.0–12.0)
Neutro Abs: 4.1 K/uL (ref 1.4–7.7)
Neutrophils Relative %: 67.3 % (ref 43.0–77.0)
Platelets: 203 K/uL (ref 150.0–400.0)
RBC: 3.98 Mil/uL (ref 3.87–5.11)
RDW: 14.9 % (ref 11.5–15.5)
WBC: 6.1 K/uL (ref 4.0–10.5)

## 2024-03-14 LAB — IBC + FERRITIN
Ferritin: 49.6 ng/mL (ref 10.0–291.0)
Iron: 77 ug/dL (ref 42–145)
Saturation Ratios: 22.1 % (ref 20.0–50.0)
TIBC: 348.6 ug/dL (ref 250.0–450.0)
Transferrin: 249 mg/dL (ref 212.0–360.0)

## 2024-03-14 LAB — BASIC METABOLIC PANEL WITH GFR
BUN: 32 mg/dL — ABNORMAL HIGH (ref 6–23)
CO2: 29 meq/L (ref 19–32)
Calcium: 9.7 mg/dL (ref 8.4–10.5)
Chloride: 102 meq/L (ref 96–112)
Creatinine, Ser: 1.42 mg/dL — ABNORMAL HIGH (ref 0.40–1.20)
GFR: 34.17 mL/min — ABNORMAL LOW (ref >60.00)
Glucose, Bld: 84 mg/dL (ref 70–99)
Potassium: 4.3 meq/L (ref 3.5–5.1)
Sodium: 138 meq/L (ref 135–145)

## 2024-03-14 NOTE — Patient Instructions (Addendum)
 Your provider has requested that you go to the basement level for lab work before leaving today. Press B on the elevator. The lab is located at the first door on the left as you exit the elevator.  Increase your Citracel to 2 teaspoons twice daily.   You have been given a testing kit to check for small intestine bacterial overgrowth (SIBO) which is completed by a company named [facility]. Make sure to return your test in the mail using the return mailing label given to you along with the kit. The test order, your demographic and insurance information have all already been sent to the company. [facility] will collect an upfront charge of $109.00 for commercial insurance plans and $229.00 if you are paying cash. The potential remaining total after claim submission and review is $120.00. Make sure to discuss with [facility] PRIOR to having the test to see if they have gotten information from your insurance company as to how much your testing will cost out of pocket, if any. Please contact [facility] at phone number [phone] to get instructions regarding how to perform the test as our office is unable to give specific testing instructions.   We will contact you to schedule an appointment when the January schedule becomes available.  _______________________________________________________  If your blood pressure at your visit was 140/90 or greater, please contact your primary care physician to follow up on this.  _______________________________________________________  If you are age 83 or older, your body mass index should be between 23-30. Your Body mass index is 20.53 kg/m. If this is out of the aforementioned range listed, please consider follow up with your Primary Care Provider.  If you are age 83 or younger, your body mass index should be between 19-25. Your Body mass index is 20.53 kg/m. If this is out of the aformentioned range listed, please consider follow up with  your Primary Care Provider.   ________________________________________________________  The [facility] providers would like to encourage you to use MYCHART to communicate with providers for non-urgent requests or questions.  Due to long hold times on the telephone, sending your provider a message by [facility] may be a faster and more efficient way to get a response.  Please allow 48 business hours for a response.  Please remember that this is for non-urgent requests.  _______________________________________________________  [facility] is using a team-based approach to care.  Your team is made up of your doctor and two to three APPS. Our APPS (Nurse Practitioners and Physician Assistants) work with your physician to ensure care continuity for you. They are fully qualified to address your health concerns and develop a treatment plan. They communicate directly with your gastroenterologist to care for you. Seeing the Advanced Practice Practitioners on your physician's team can help you by facilitating care more promptly, often allowing for earlier appointments, access to diagnostic testing, procedures, and other specialty referrals.   HEMORRHOID BANDING PROCEDURE    FOLLOW-UP CARE   The procedure you have had should have been relatively painless since the banding of the area involved does not have nerve endings and there is no pain sensation.  The rubber band cuts off the blood supply to the hemorrhoid and the band may fall off as soon as 48 hours after the banding (the band may occasionally be seen in the toilet bowl following a bowel movement). You may notice a temporary feeling of fullness in the rectum which should respond adequately to plain Tylenol or Motrin.  Following the banding, avoid  strenuous exercise that evening and resume full activity the next day.  A sitz bath (soaking in a warm tub) or bidet is soothing, and can be useful for cleansing the area after bowel movements.      To avoid constipation, take two tablespoons of natural wheat bran, natural oat bran, flax, Benefiber or any over the counter fiber supplement and increase your water intake to 7-8 glasses daily.    Unless you have been prescribed anorectal medication, do not put anything inside your rectum for two weeks: No suppositories, enemas, fingers, etc.  Occasionally, you may have more bleeding than usual after the banding procedure.  This is often from the untreated hemorrhoids rather than the treated one.  Don't be concerned if there is a tablespoon or so of blood.  If there is more blood than this, lie flat with your bottom higher than your head and apply an ice pack to the area. If the bleeding does not stop within a half an hour or if you feel faint, call our office at [phone] or go to the emergency room.  Problems are not common; however, if there is a substantial amount of bleeding, severe pain, chills, fever or difficulty passing urine (very rare) or other problems, you should call us  at [phone] or report to the nearest emergency room.  Do not stay seated continuously for more than 2-3 hours for a day or two after the procedure.  Tighten your buttock muscles 10-15 times every two hours and take 10-15 deep breaths every 1-2 hours.  Do not spend more than a few minutes on the toilet if you cannot empty your bowel; instead re-visit the toilet at a later time.

## 2024-03-15 ENCOUNTER — Other Ambulatory Visit: Payer: Self-pay

## 2024-03-15 DIAGNOSIS — H90A22 Sensorineural hearing loss, unilateral, left ear, with restricted hearing on the contralateral side: Secondary | ICD-10-CM

## 2024-03-15 NOTE — Progress Notes (Signed)
 Subjective:    Patient ID: Sheila Newton, female    DOB: Nov 14, 1940, 83 y.o.   MRN: [id number]  HPI Sheila Newton is an 83 year old female with chronic constipation and hemorrhoids, fecal smearing, GERD who presents with bowel-related complaints and for additional hemorrhoidal banding.  She has experienced bowel leakage for a couple of years, described as 'little drops' occurring unexpectedly, often during urination. Despite using Citrucel two teaspoons daily, the frequency of leakage has not decreased. She also takes Amitiza  (lubiprostone ) 16 mg twice daily for chronic constipation. The leakage is described as small amounts, sometimes 'the size of a pea', necessitating proximity to a bathroom.  She experiences nausea, car sickness, and exhaustion, feeling the need to sleep during the day. These symptoms contribute to an overall feeling of being unwell, with a sense of being 'wiped out' by midday, despite functioning in the morning.  She has a history of hemorrhoidal issues, having undergone hemorrhoidal banding on January 17, 2024 and October 26, 2023. Her symptoms included fecal smearing, bleeding, and prolapse. She feels that after the initial hemorrhoids were treated, a small one now feels enlarged again, and possibly a new one has developed.  She experiences regular gas and bloating, with both belching and flatulence. These symptoms are bothersome and persistent.  No response to Phazyme.   Review of Systems As per HPI, otherwise negative  Current Medications, Allergies, Past Medical History, Past Surgical History, Family History and Social History were reviewed in [employer] record.    Objective:   Physical Exam BP 124/68   Pulse 85   Ht 5' 2 (1.575 m)   Wt 112 lb 4 oz (50.9 kg)   BMI 20.53 kg/m  Gen: awake, alert, NAD HEENT: anicteric  Abd: soft, NT/ND, +BS throughout Ext: no c/c/e Neuro: nonfocal     Latest Ref Rng & Units 03/14/2024    5:07 PM 07/05/2023     7:29 AM 01/07/2023   11:59 AM  CBC  WBC 4.0 - 10.5 K/uL 6.1  6.3  7.4   Hemoglobin 12.0 - 15.0 g/dL 88.5  88.0  88.3   Hematocrit 36.0 - 46.0 % 35.4  36.3  36.7   Platelets 150.0 - 400.0 K/uL 203.0  218  213    Iron/TIBC/Ferritin/ %Sat    Component Value Date/Time   IRON 77 03/14/2024 1707   IRON 118 06/08/2022 0000   TIBC 348.6 03/14/2024 1707   TIBC 322 06/08/2022 0000   FERRITIN 49.6 03/14/2024 1707   IRONPCTSAT 22.1 03/14/2024 1707   IRONPCTSAT 15 (L) 07/05/2023 0729         Assessment & Plan:   Fecal incontinence 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 - Increase Citrucel to two teaspoons twice daily. - Consider pelvic floor physical therapy if symptoms persist.  2. Internal hemorrhoids with prolapse and bleeding Internal hemorrhoids with prolapse and bleeding - Perform third rubber band ligation.   PROCEDURE NOTE:  The patient presents with symptomatic grade 2 internal hemorrhoids, requesting rubber band ligation of her hemorrhoidal disease.  All risks, benefits and alternative forms of therapy were described and informed consent was obtained.   The anorectum was pre-medicated with 0.125% nitroglycerin  ointment The decision was made to band the RP (LL and RA banded previously) internal hemorrhoid, and the [facility] System was used to perform band ligation without complication.   Digital anorectal examination was then performed to assure proper positioning of the band, and to adjust the  banded tissue as required.  The patient was discharged home without pain or other issues.  Dietary and behavioral recommendations were given and along with follow-up instructions.     The patient will return as scheduled for follow-up and possible additional banding as required. No complications were encountered and the patient tolerated the procedure well.   3. Chronic constipation Chronic constipation managed with lubiprostone   and Citrucel. Current fiber intake insufficient for optimal stool bulk. - Increase Citrucel to two teaspoons twice daily. 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 Possible abnormal bacterial population in intestinal microbiome. - Order SIBO breath test.  Fatigue Persistent fatigue, particularly in the afternoon. Recent blood work not reviewed. - Review recent blood work for blood counts, iron stores, and B12 levels.  30 minutes total spent today including patient facing time, coordination of care, reviewing medical history/procedures/pertinent radiology studies, and documentation of the encounter.

## 2024-03-16 ENCOUNTER — Ambulatory Visit: Payer: Self-pay | Admitting: Internal Medicine

## 2024-03-24 ENCOUNTER — Ambulatory Visit: Admitting: Pulmonary Disease

## 2024-03-24 ENCOUNTER — Encounter: Payer: Self-pay | Admitting: Pulmonary Disease

## 2024-03-24 VITALS — BP 124/80 | HR 86 | Temp 97.9°F | Ht 62.0 in | Wt 113.4 lb

## 2024-03-24 DIAGNOSIS — Z9189 Other specified personal risk factors, not elsewhere classified: Secondary | ICD-10-CM | POA: Diagnosis not present

## 2024-03-24 DIAGNOSIS — R5383 Other fatigue: Secondary | ICD-10-CM | POA: Diagnosis not present

## 2024-03-24 DIAGNOSIS — R413 Other amnesia: Secondary | ICD-10-CM

## 2024-03-24 DIAGNOSIS — G478 Other sleep disorders: Secondary | ICD-10-CM

## 2024-03-24 DIAGNOSIS — R0602 Shortness of breath: Secondary | ICD-10-CM | POA: Diagnosis not present

## 2024-03-24 NOTE — Progress Notes (Signed)
 Subjective:    Patient ID: Sheila Newton, female    DOB: [birth year], 83 y.o.   MRN: [id number]  Patient Care Team: Sheila Harlene POUR, NP as PCP - General (Geriatric Medicine) Sheila Dedra CROME, MD as Consulting Physician (Pulmonary Disease)  Chief Complaint  Patient presents with   Shortness of Breath    BACKGROUND/INTERVAL: [name] is an 83 year old lifelong never smoker with history as noted below who presents for follow-up of dyspnea and dizziness that have been present for years.  Patient was last seen for this issue on 22 Oct 2023.  She has had high-resolution chest CT on 05 March 2023 that showed no evidence of fibrotic interstitial lung disease, mild air trapping indicative of small airways disease, mild cylindrical bronchiectasis and possible gastric wall thickening.  She has not had any worsening of her shortness of breath and sighing respirations since she was last seen. Since that visit she has had upper endoscopy.  She did notice marked improvement in shortness of breath with management of gastroesophageal reflux.  She still however has some issues with esophageal dysmotility that are being addressed by GI.  SIBO is being considered.  She presents today mostly with issues with fatigue.  HPI Discussed the use of AI scribe software for clinical note transcription with the patient, who gave verbal consent to proceed.  History of Present Illness   Sheila Newton is an 83 year old female who presents with shortness of breath and bowel issues.  She experiences episodes of shortness of breath, particularly during hot weather and while engaging in activities such as watering plants. Recently, her shortness of breath has improved.  She has bowel issues, including stool leakage, for which she takes Citrucel, providing some relief. She is under the care of a gastroenterologist, Sheila Newton, who manages her hemorrhoids and provided a breath test kit for evaluation of potential SIBO, which she has  not yet completed.  She has a history of hearing difficulties and uses hearing aids. During a visit to an audiologist in [place], a spot was found on a nerve inside her ear, prompting an upcoming MRI of her brain. She is considering changing her hearing aids but is unsure how to proceed due to the distance from the audiologist.  She frequently feels tired and needs to nap in the afternoons, which she finds rejuvenating. She has concerns about memory, noting increased forgetfulness over the past six months. She has undergone memory tests twice, which she passed, but remains concerned about her memory lapses.  She has a foot issue, specifically a callus treated with acid by a podiatrist, limiting her ability to exercise for the past three to four months.  She is currently without a primary care physician but has an upcoming appointment with Dr. Valora in December. She has been on a pain patch for eight to nine years, which may contribute to her memory issues.  Her daughter and son-in-law, who have holistic views on medicine, influence her decisions about treatments like the flu shot. She has not received a flu shot this season and expresses uncertainty about receiving one due to family influence.     Review of Systems A 10 point review of systems was performed and it is as noted above otherwise negative.   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   Chronic radicular lumbar pain  11/26/2020   Lumbar spondylosis 11/26/2020   Lumbar degenerative disc disease 11/26/2020   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   Encounter for long-term opiate analgesic use 11/26/2020   Spinal stenosis, lumbar region, 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/26/2020   Chronic pain syndrome 11/26/2020   Small fiber neuropathy 11/26/2020   Piriformis syndrome of both sides 11/26/2020   Neuroforaminal stenosis of lumbar spine (L5/S1, severe bilateral) 11/26/2020   Compression fracture of T12 vertebra (HCC) 06/22/2019   GERD without esophagitis 06/13/2019   Callus of foot 10/26/2017   Great toe pain, right 10/26/2017   Hypercholesterolemia 07/20/2002   Allergic rhinitis 04/15/2001    Social History   Tobacco Use   Smoking status: Never    Passive exposure: Never   Smokeless tobacco: Never  Substance Use Topics   Alcohol use: Never    Allergies  Allergen Reactions   Sumatriptan Anxiety, Palpitations and Other (See Comments)    Felt weak    Penicillin G Anxiety and Other (See Comments)    Reaction unknown given a lot of it as a child. Was told not to take anymore of it Reaction unknown given a lot of it as a child. Was told not to take anymore of it Reaction unknown given a lot of it as a child. Was told not to take anymore of it Reaction unknown given a lot of it as a child. Was told not to take anymore of it    Topiramate Other (See Comments)   Ilosone [Erythromycin] Other (See Comments)    Current Meds  Medication Sig   atorvastatin (LIPITOR) 20 MG tablet TAKE 1 TABLET BY MOUTH EVERY DAY   buprenorphine  (BUTRANS ) 20 MCG/HR PTWK Place 1 patch onto the skin once a week. For chronic pain syndrome. Ok for pt to fill up to 3 days earlier than fill date.   cetirizine  (ZYRTEC ) 10 MG tablet Take 0.5 tablets (5 mg total) by mouth daily.   Cholecalciferol (VITAMIN D3) 50 MCG (2000 UT) TABS Take one tablet by mouth daily.   Cyanocobalamin (VITAMIN B 12 PO) Take by mouth daily.   estrogen,  conjugated,-medroxyprogesterone (PREMPRO ) 0.3-1.5 MG tablet Take 1 tablet by mouth daily.   famotidine (PEPCID) 20 MG tablet Take 20 mg by mouth at bedtime.   fluticasone  (FLONASE ) 50 MCG/ACT nasal spray Place 2 sprays into both nostrils daily.   lubiprostone  (AMITIZA ) 8 MCG capsule TAKE 2 CAPSULES (16 MCG TOTAL) BY MOUTH 2 (TWO) TIMES DAILY WITH A MEAL. K59.03   Magnesium 200 MG CHEW Chew by mouth daily.   metroNIDAZOLE  (METROGEL ) 1 % gel APPLY TO AFFECTED AREA TOPICALLY EVERY DAY   Multiple Vitamins-Minerals (MULTIVITAMIN ADULT) CHEW Chew by mouth daily.   pantoprazole  (PROTONIX ) 40 MG tablet TAKE 1 TABLET BY MOUTH EVERY DAY   psyllium (HYDROCIL/METAMUCIL) 95 % PACK Take 1 packet by mouth daily.   sertraline  (ZOLOFT ) 50 MG tablet Take 1 tablet (50 mg total) by mouth daily.   SODIUM FLUORIDE 5000 PPM 1.1 % GEL dental gel Place onto teeth 3 (three) times daily.   traMADol  (ULTRAM ) 50 MG tablet Take 1 tablet (50 mg total) by mouth 2 (two) times daily as needed for  severe pain (pain score 7-10).   traMADol  (ULTRAM -ER) 100 MG 24 hr tablet Take 1 tablet (100 mg total) by mouth at bedtime. Ok for pt to fill up to 3 days earlier than fill date.   Zoledronic Acid (RECLAST IV) Inject into the vein. Once yearly    Immunization History  Administered Date(s) Administered   Fluad Quad(high Dose 65+) 02/22/2022, 03/16/2023   Influenza-Unspecified 03/27/2019, 03/25/2020   Moderna SARS-COV2 Booster Vaccination 03/01/2021, 10/21/2021   Moderna Sars-Covid-2 Vaccination 06/08/2019, 07/06/2019, 04/09/2020, 10/13/2020   Pneumococcal Conjugate,unspecified 03/08/2001, 04/23/2006   Pneumococcal Conjugate-13 02/07/2013   Pneumococcal Polysaccharide-23 04/23/2006   RSV,unspecified 04/29/2022   Tdap 12/21/2018   Zoster Recombinant(Shingrix) 05/25/2016, 05/25/2017   Zoster, Live 09/02/2005, 08/06/2017        Objective:     BP 124/80   Pulse 86   Temp 97.9 F (36.6 C) (Temporal)   Ht 5' 2 (1.575 m)    Wt 113 lb 6.4 oz (51.4 kg)   SpO2 97%   BMI 20.74 kg/m   SpO2: 97 %  GENERAL: Thin, well-developed woman, no acute distress, fully ambulatory.  No conversational dyspnea HEAD: Normocephalic, atraumatic.  EYES: Pupils equal, round, reactive to light.  No scleral icterus.  MOUTH: Dentition intact, oral mucosa moist.  No thrush. NECK: Supple. No thyromegaly. Trachea midline. No JVD.  No adenopathy. PULMONARY: Good air entry bilaterally.  No adventitious sounds. CARDIOVASCULAR: S1 and S2. Regular rate and rhythm.  No rubs, murmurs or gallops heard. ABDOMEN: Benign. MUSCULOSKELETAL: No joint deformity, no clubbing, no edema.  NEUROLOGIC: No overt focal deficits.  Speech is fluent. SKIN: Intact,warm,dry. PSYCH: Mood and behavior normal.     03/24/2024   11:41 AM  Results of the Epworth flowsheet  Sitting and reading 2  Watching TV 0  Sitting, inactive in a public place (e.g. a theatre or a meeting) 0  As a passenger in a car for an hour without a break 0  Lying down to rest in the afternoon when circumstances permit 3  Sitting and talking to someone 0  Sitting quietly after a lunch without alcohol 0  In a car, while stopped for a few minutes in traffic 0  Total score 5         Assessment & Plan:     ICD-10-CM   1. Shortness of breath  R06.02     2. Fatigue, unspecified type  R53.83     3. Non-restorative sleep  G47.8 Home sleep test    4. At risk for sleep apnea  Z91.89 Home sleep test    5. Memory impairment  R41.3       Orders Placed This Encounter  Procedures   Home sleep test    Standing Status:   Future    Expected Date:   04/07/2024    Expiration Date:   03/24/2025    Where should this test be performed::   [facility] - Pulmonary   Discussion:    Shortness of breath, improved Shortness of breath has improved since the last visit. Episodes occurred during hot weather and physical activity, such as watering plants. No current lung issues identified.  Suspect  deconditioning as etiology.  Additionally could be related to buprenorphine  patch. - Continue current management as symptoms have improved.  Fatigue and memory loss, possibly related to chronic pain therapy and sleep disturbance Fatigue and memory loss may be related to chronic pain therapy and potential sleep disturbance. Long-term use of a pain patch may contribute to these symptoms  due to cumulative effects. Sleep disturbance is suspected, and a sleep study is considered to evaluate this possibility. - Ordered home sleep study to evaluate for sleep disturbance. - Administered questionnaire to assess sleep quality. - Should discuss potential adjustment of pain patch dosage with primary care provider/pain management physician.     Follow-up in 2 months time call sooner should any new problems arise.   Advised if symptoms do not improve or worsen, to please contact office for sooner follow up or seek emergency care.    I spent 32 minutes of dedicated to the care of this patient on the date of this encounter to include pre-visit review of records, face-to-face time with the patient discussing conditions above, post visit ordering of testing, clinical documentation with the electronic health record, making appropriate referrals as documented, and communicating necessary findings to members of the patients care team.     C. Leita Sanders, MD Advanced Bronchoscopy PCCM [facility]    *This note was generated using voice recognition software/Dragon and/or AI transcription program.  Despite best efforts to proofread, errors can occur which can change the meaning. Any transcriptional errors that result from this process are unintentional and may not be fully corrected at the time of dictation.

## 2024-03-24 NOTE — Patient Instructions (Signed)
 VISIT SUMMARY:  During today's visit, we discussed your shortness of breath, bowel issues, fatigue, memory concerns, and foot problem. We also reviewed your current treatments and upcoming medical appointments.  YOUR PLAN:  -SHORTNESS OF BREATH: Your shortness of breath has improved since the last visit, especially during hot weather and physical activities like watering plants. No current lung issues were identified, so we will continue with your current management as your symptoms have improved.  -FATIGUE AND MEMORY LOSS: Your fatigue and memory loss may be related to your chronic pain therapy and possible sleep disturbances. Long-term use of your pain patch might be contributing to these symptoms. We have ordered a home sleep study to evaluate for sleep disturbances and administered a questionnaire to assess your sleep quality. We will discuss the potential adjustment of your pain patch dosage with your primary care provider.  INSTRUCTIONS:  Please complete the home sleep study as instructed and fill out the sleep quality questionnaire. Follow up with your primary care provider, Dr. Valora, in December to discuss the results and consider adjusting your pain patch dosage.

## 2024-03-27 ENCOUNTER — Telehealth: Payer: Self-pay | Admitting: Internal Medicine

## 2024-03-27 NOTE — Telephone Encounter (Signed)
 Pt states she got constipated over the weekend and had to use an enema. States after that she started having pain with urination and lots of urinary frequency. Discussed with pt that she should contact her pcp as she may have a uti and that if they could not see her she should go to an urgent care. Pt verbalized understanding.

## 2024-03-27 NOTE — Telephone Encounter (Signed)
 Inbound call from patient stating she has been experiencing severe pain when she goes to bathroom. Patient was last seen in office for a hemorrhoid banding on 03/14/24 and would like to speak to nurse and be advised on what to do. Please advise  Thank you

## 2024-03-28 ENCOUNTER — Other Ambulatory Visit: Payer: Self-pay | Admitting: Nurse Practitioner

## 2024-03-28 ENCOUNTER — Other Ambulatory Visit: Payer: Self-pay

## 2024-03-28 DIAGNOSIS — G894 Chronic pain syndrome: Secondary | ICD-10-CM

## 2024-03-28 DIAGNOSIS — M47816 Spondylosis without myelopathy or radiculopathy, lumbar region: Secondary | ICD-10-CM

## 2024-03-28 DIAGNOSIS — M48062 Spinal stenosis, lumbar region with neurogenic claudication: Secondary | ICD-10-CM

## 2024-03-28 DIAGNOSIS — Z79891 Long term (current) use of opiate analgesic: Secondary | ICD-10-CM

## 2024-03-29 ENCOUNTER — Ambulatory Visit
Admission: RE | Admit: 2024-03-29 | Discharge: 2024-03-29 | Disposition: A | Discharge status: Home / Self Care | Source: Ambulatory Visit

## 2024-03-29 ENCOUNTER — Ambulatory Visit (HOSPITAL_BASED_OUTPATIENT_CLINIC_OR_DEPARTMENT_OTHER): Admitting: Nurse Practitioner

## 2024-03-29 ENCOUNTER — Other Ambulatory Visit: Payer: Self-pay

## 2024-03-29 ENCOUNTER — Encounter: Payer: Self-pay | Admitting: Radiology

## 2024-03-29 VITALS — BP 123/64 | HR 87 | Temp 97.3°F | Ht 62.0 in | Wt 113.0 lb

## 2024-03-29 DIAGNOSIS — G894 Chronic pain syndrome: Secondary | ICD-10-CM

## 2024-03-29 DIAGNOSIS — M48062 Spinal stenosis, lumbar region with neurogenic claudication: Secondary | ICD-10-CM | POA: Insufficient documentation

## 2024-03-29 DIAGNOSIS — Z79899 Other long term (current) drug therapy: Secondary | ICD-10-CM

## 2024-03-29 DIAGNOSIS — H90A22 Sensorineural hearing loss, unilateral, left ear, with restricted hearing on the contralateral side: Secondary | ICD-10-CM | POA: Diagnosis present

## 2024-03-29 DIAGNOSIS — Z79891 Long term (current) use of opiate analgesic: Secondary | ICD-10-CM | POA: Diagnosis present

## 2024-03-29 DIAGNOSIS — M47816 Spondylosis without myelopathy or radiculopathy, lumbar region: Secondary | ICD-10-CM

## 2024-03-29 MED ORDER — TRAMADOL HCL 50 MG PO TABS
50.0000 mg | ORAL_TABLET | Freq: Two times a day (BID) | ORAL | 2 refills | Status: DC | PRN
  Filled 2024-03-29 – 2024-04-06 (×2): qty 60, 30d supply, fill #0
  Filled 2024-05-04 (×2): qty 60, 30d supply, fill #1
  Filled 2024-06-02: qty 60, 30d supply, fill #2

## 2024-03-29 MED ORDER — TRAMADOL HCL ER 100 MG PO TB24
100.0000 mg | ORAL_TABLET | Freq: Every day | ORAL | 2 refills | Status: DC
  Filled 2024-03-29 – 2024-04-06 (×2): qty 30, 30d supply, fill #0
  Filled 2024-05-04 (×2): qty 30, 30d supply, fill #1
  Filled 2024-06-02: qty 30, 30d supply, fill #2

## 2024-03-29 MED ORDER — BUPRENORPHINE 20 MCG/HR TD PTWK
1.0000 | MEDICATED_PATCH | TRANSDERMAL | 2 refills | Status: DC
  Filled 2024-03-29 – 2024-04-03 (×3): qty 4, 28d supply, fill #0
  Filled 2024-05-01: qty 4, 28d supply, fill #1
  Filled 2024-06-02: qty 4, 28d supply, fill #2

## 2024-03-29 MED ORDER — GADOBUTROL 1 MMOL/ML IV SOLN
5.0000 mL | Freq: Once | INTRAVENOUS | Status: AC | PRN
  Administered 2024-03-29: 5 mL via INTRAVENOUS

## 2024-03-29 NOTE — Progress Notes (Signed)
 PROVIDER NOTE: Interpretation of information contained herein should be left to medically-trained personnel. Specific patient instructions are provided elsewhere under Patient Instructions section of medical record. This document was created in part using AI and STT-dictation technology, any transcriptional errors that may result from this process are unintentional.  Patient: Sheila Newton  Service: E/M   PCP: Caro Harlene POUR, NP  DOB: [birth year]  DOS: 03/29/2024  Provider: Emmy POUR Blanch, NP  MRN: [id number]  Delivery: Face-to-face  Specialty: Interventional Pain Management  Type: Established Patient  Setting: Ambulatory outpatient facility  Specialty designation: 09  Referring Prov.: Abdul Fine, MD  Location: Outpatient office facility       History of present illness (HPI) Sheila Newton, a 83 y.o. year old female, is here today because of her Generalized body aches. Sheila Newton primary complain today is Generalized Body Aches  Pertinent problems: Sheila Newton has Chronic radicular lumbar pain; Lumbar spondylosis; Lumbar degenerative disc disease; Pudendal neuralgia; Encounter for long-term opiate analgesic use; Spinal stenosis, lumbar region, with neurogenic claudication; Chronic pain syndrome; Small fiber neuropathy; Piriformis syndrome of both sides; Neuroforaminal stenosis of lumbar spine (L5/S1, severe bilateral); and SI joint arthritis (HCC) on their pertinent problem list. Pain Assessment: Severity of Chronic pain is reported as a 2 /10. Location: Generalized Right, Left, Lower/pain radiaties everywhere. Onset: More than a month ago. Quality: Aching, Burning, Constant. Timing: Constant. Modifying factor(s): Meds resting. Vitals:  height is 5' 2 (1.575 m) and weight is 113 lb (51.3 kg). Her temperature is 97.3 F (36.3 C) (abnormal). Her blood pressure is 123/64 and her pulse is 87. Her oxygen saturation is 99%.  BMI: Estimated body mass index is 20.67 kg/m as calculated from the  following:   Height as of this encounter: 5' 2 (1.575 m).   Weight as of this encounter: 113 lb (51.3 kg).  Last encounter: 12/28/2023. Last procedure: Visit date not found.  Reason for encounter: medication management. No change in medical history since last visit.  Patient's pain is at baseline.  Patient continues multimodal pain regimen as prescribed.  States that it provides pain relief and improvement in functional status.   Discussed the use of AI scribe software for clinical note transcription with the patient, who gave verbal consent to proceed.  History of Present Illness   Sheila Newton is an 83 year old female who presents with concerns about itchiness potentially related to her Butrans  patch.  She has been using the Butrans  patch for eight to nine years and has experienced itchiness all over the body, which she has managed over time. Recently, the itchiness has spread to her hands and other areas of her body, which she finds unusual. She complains of generalized itching and expressed concern related to the Butrans  patch; however I explained that she can apply Eucerin cream or lotion around the area to help relieve the itching.   No hives, severe itching, or swelling of the face, lips, tongue, or throat. She experiences dry mouth and tries to drink more water.  Her current medications include the Butrans  patch, which she applies as directed, and tramadol , with no reported side effects from the medications.     Pharmacotherapy Assessment   Tramadol  (Ultram ) 50 mg tablet 2 times daily as needed for severe pain. MME=20 Tramadol  (Ultram -ER) 100 mg 24-hour tablet at bedtime. MME=20 Butrans  20 mcg/h 1 patch onto the skin once a week Monitoring: [place] PMP: PDMP reviewed during this encounter.       Pharmacotherapy: No side-effects  or adverse reactions reported. Compliance: No problems identified. Effectiveness: Clinically acceptable.  Sheila Dorothe LABOR, RN  03/29/2024 12:46 PM  Sign when  Signing Visit Nursing Pain Medication Assessment:  Safety precautions to be maintained throughout the outpatient stay will include: orient to surroundings, keep bed in low position, maintain call bell within reach at all times, provide assistance with transfer out of bed and ambulation.  Medication Inspection Compliance: Pill count conducted under aseptic conditions, in front of the patient. Neither the pills nor the bottle was removed from the patient's sight at any time. Once count was completed pills were immediately returned to the patient in their original bottle.  Medication #1: Buprenorphine  (Suboxone) Pill/Patch Count: 0 of 4 pills/patches remain Pill/Patch Appearance: Markings consistent with prescribed medication Bottle Appearance: Standard pharmacy container. Clearly labeled. Filled Date: 69 / 13 / 2025 Last Medication intake:  03/27/24  Medication #2: Tramadol  (Ultram ) 50mg  Pill/Patch Count: 22 of 60 pills/patches remain Pill/Patch Appearance: Markings consistent with prescribed medication Bottle Appearance: Standard pharmacy container. Clearly labeled. Filled Date: 62 / 14 / 2025 Last Medication intake:  Today  Medication #3: Tramadol  (Ultram ) 100mg  Pill/Patch Count: 11 of 30 pills/patches remain Pill/Patch Appearance: Markings consistent with prescribed medication Bottle Appearance: Standard pharmacy container. Clearly labeled. Filled Date: 33 / 14 / 2025 Last Medication intake:  Today     UDS:  Summary  Date Value Ref Range Status  10/05/2023 FINAL  Final    Comment:    ==================================================================== [employer] 13 (MW) ==================================================================== Test                             Result       Flag       Units  Drug Present and Declared for Prescription Verification   Buprenorphine                   40           EXPECTED   ng/mg creat   Norbuprenorphine               47            EXPECTED   ng/mg creat    Source of buprenorphine  is a scheduled prescription medication.    Norbuprenorphine is an expected metabolite of buprenorphine .    Tramadol                        >3247        EXPECTED   ng/mg creat   O-Desmethyltramadol            >3247        EXPECTED   ng/mg creat   N-Desmethyltramadol            >3247        EXPECTED   ng/mg creat    Source of tramadol  is a prescription medication. O-desmethyltramadol    and N-desmethyltramadol are expected metabolites of tramadol .  ==================================================================== Test                      Result    Flag   Units      Ref Range   Creatinine              154              mg/dL      >=79 ==================================================================== Declared Medications:  The flagging and interpretation  on this report are based on the  following declared medications.  Unexpected results may arise from  inaccuracies in the declared medications.   **Note: The testing scope of this panel includes these medications:   Tramadol  (Ultram )   **Note: The testing scope of this panel does not include small to  moderate amounts of these reported medications:   Buprenorphine  Patch (BuTrans )   **Note: The testing scope of this panel does not include the  following reported medications:   Atorvastatin (Lipitor)  Cyanocobalamin  Estrogens (Prempro )  Famotidine (Pepcid)  Fluticasone  (Flonase )  Hydrocortisone   Levalbuterol  (Xopenex )  Lubiprostone  (Amitiza )  Magnesium  Medroxyprogesterone (Prempro )  Metronidazole  (MetroGel )  Multivitamin  Pantoprazole  (Protonix )  Sertraline  (Zoloft )  Vitamin D3  Zoledronic Acid ==================================================================== For clinical consultation, please call [phone]. ====================================================================     No results found for: CBDTHCR No results found for: D8THCCBX No results  found for: D9THCCBX  ROS  Constitutional: Denies any fever or chills Gastrointestinal: No reported hemesis, hematochezia, vomiting, or acute GI distress Musculoskeletal: Generalized body aches Neurological: No reported episodes of acute onset apraxia, aphasia, dysarthria, agnosia, amnesia, paralysis, loss of coordination, or loss of consciousness  Medication Review  Cyanocobalamin, Magnesium, Multivitamin Adult, Vitamin D3, Zoledronic Acid, atorvastatin, buprenorphine , cetirizine , estrogen (conjugated)-medroxyprogesterone, famotidine, fluticasone , lubiprostone , metroNIDAZOLE , pantoprazole , psyllium, sertraline , sodium fluoride, and traMADol   History Review  Allergy: Sheila Newton is allergic to sumatriptan, penicillin g, topiramate, and ilosone [erythromycin]. Drug: Sheila Newton  reports no history of drug use. Alcohol:  reports no history of alcohol use. Tobacco:  reports that she has never smoked. She has never been exposed to tobacco smoke. She has never used smokeless tobacco. Social: Sheila Newton  reports that she has never smoked. She has never been exposed to tobacco smoke. She has never used smokeless tobacco. She reports that she does not drink alcohol and does not use drugs. Medical:  has a past medical history of Small fiber polyneuropathy and Spondylolysis. 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 Family: family history includes Arthritis/Rheumatoid in her father; Bladder Cancer in her mother; Transient ischemic attack in her father.  Laboratory Chemistry Profile   Renal Lab Results  Component Value Date   BUN 32 (H) 03/14/2024   CREATININE 1.42 (H) 03/14/2024   BCR 25 (H) 07/05/2023   GFR 34.17 (L) 03/14/2024   GFRNONAA 54 (L) 01/07/2023     Hepatic Lab Results  Component Value Date   AST 23 07/05/2023   ALT 20 07/05/2023   ALBUMIN 3.7 01/07/2023   ALKPHOS 40 01/07/2023    Electrolytes Lab Results  Component Value Date   NA 138 03/14/2024   K 4.3 03/14/2024   CL 102 03/14/2024   CALCIUM 9.7 03/14/2024    Bone Lab Results  Component Value Date   VD25OH 71 07/05/2023    Inflammation (CRP: Acute Phase) (ESR: Chronic Phase) No results found for: CRP, ESRSEDRATE, LATICACIDVEN       Note: Above Lab results reviewed.  Recent Imaging Review  MM 3D SCREENING MAMMOGRAM BILATERAL BREAST CLINICAL DATA:  Screening.  EXAM: DIGITAL SCREENING BILATERAL MAMMOGRAM WITH TOMOSYNTHESIS AND CAD  TECHNIQUE: Bilateral screening digital craniocaudal and mediolateral oblique mammograms were obtained. Bilateral screening digital breast tomosynthesis was performed. The images were evaluated with computer-aided detection.  COMPARISON:  Previous exam(s).  ACR Breast Density Category b: There are scattered areas of  fibroglandular density.  FINDINGS: There are no findings suspicious for malignancy.  IMPRESSION: No mammographic evidence of malignancy. A result letter of this screening mammogram will be mailed directly to the patient.  RECOMMENDATION: Screening mammogram in one year. (Code:SM-B-01Y)  BI-RADS CATEGORY  1: Negative.  Electronically Signed   By: Inocente Ast M.D.   On: 09/02/2023 08:40 Note: Reviewed        Physical Exam  Vitals: BP 123/64   Pulse 87   Temp (!) 97.3 F (36.3 C)   Ht 5' 2 (1.575 m)   Wt 113 lb (51.3 kg)   SpO2 99%   BMI 20.67 kg/m  BMI: Estimated body mass index is 20.67 kg/m as calculated from the following:   Height as of this encounter: 5' 2 (1.575 m).   Weight as of this encounter: 113 lb (51.3 kg). Ideal: Ideal body weight: 50.1 kg (110 lb 7.2 oz) Adjusted ideal body weight: 50.6 kg (111 lb 7.5 oz) General appearance: Well nourished, well developed, and well  hydrated. In no apparent acute distress Mental status: Alert, oriented x 3 (person, place, & time)       Respiratory: No evidence of acute respiratory distress Eyes: PERLA  Musculoskeletal: Generalized body pain  Assessment   Diagnosis Status  1. Chronic pain syndrome   2. Lumbar spondylosis (severe, L4/5)   3. Medication management   4. Spinal stenosis, lumbar region, with neurogenic claudication   5. Encounter for long-term opiate analgesic use    Controlled Controlled Controlled   Updated Problems: No problems updated.  Plan of Care  Problem-specific:  Assessment and Plan    Chronic pain syndrome Chronic pain managed with Butrans  patch and tramadol . No severe allergic reactions. Localized skin irritation from Butrans  patch, common side effect. I I explained and advised that she can apply Eucerin cream or lotion around the area to help relieve the itching.  - Order Butrans  patch with two refills. - Order tramadol  100 mg with two refills. - Order tramadol  50 mg with two refills. - Advise to drink more water to mitigate dry mouth. - Continue dry skin management with moisturizers.  Medication-induced dry mouth Dry mouth likely due to Butrans  patch and tramadol , may worsen skin dryness and pruritus. - Advise to drink more water to mitigate dry mouth.  Medication management: Patient's pain is controlled with tramadol  and butrans  patch, will continue on current medication regimen.  Prescribing drug monitoring (PDMP) reviewed; consistent with the use of prescribed medication and no evidence of narcotic misuse or abuse.  Urine drug screening up-to-date.  The patient was advised to drink more water to prevent opioid induced constipation and dry mouth.  Schedule follow-up in 90 days for medication management.      Sheila Newton has a current medication list which includes the following long-term medication(s): atorvastatin, cetirizine , prempro , famotidine, fluticasone , pantoprazole ,  sertraline , [START ON 04/07/2024] tramadol , and [START ON 04/07/2024] tramadol .  Pharmacotherapy (Medications Ordered): Meds ordered this encounter  Medications   traMADol  (ULTRAM ) 50 MG tablet    Sig: Take 1 tablet (50 mg total) by mouth 2 (two) times daily as needed for severe pain (pain score 7-10).    Dispense:  60 tablet    Refill:  2   traMADol  (ULTRAM -ER) 100 MG 24 hr tablet    Sig: Take 1 tablet (100 mg total) by mouth at bedtime. Ok for pt to fill up to 3 days earlier than fill date.    Dispense:  30 tablet  Refill:  2   buprenorphine  (BUTRANS ) 20 MCG/HR PTWK    Sig: Place 1 patch onto the skin once a week. For chronic pain syndrome. Ok for pt to fill up to 3 days earlier than fill date.    Dispense:  4 patch    Refill:  2   Orders:  No orders of the defined types were placed in this encounter.       Return in about 3 months (around 06/29/2024) for (F2F), (MM), Emmy Blanch NP.    Recent Visits No visits were found meeting these conditions. Showing recent visits within past 90 days and meeting all other requirements Today's Visits Date Type Provider Dept  03/29/24 Office Visit Byan Poplaski K, NP [facility]  Showing today's visits and meeting all other requirements Future Appointments No visits were found meeting these conditions. Showing future appointments within next 90 days and meeting all other requirements  I discussed the assessment and treatment plan with the patient. The patient was provided an opportunity to ask questions and all were answered. The patient agreed with the plan and demonstrated an understanding of the instructions.  Patient advised to call back or seek an in-person evaluation if the symptoms or condition worsens.  I personally spent a total of 30 minutes in the care of the patient today including preparing to see the patient, getting/reviewing separately obtained history, performing a medically appropriate exam/evaluation,  counseling and educating, placing orders, referring and communicating with other health care professionals, documenting clinical information in the EHR, independently interpreting results, communicating results, and coordinating care.   Note by: Pink Maye K Kazuma Elena, NP (TTS and AI technology used. I apologize for any typographical errors that were not detected and corrected.) Date: 03/29/2024; Time: 1:12 PM

## 2024-03-29 NOTE — Progress Notes (Signed)
 Nursing Pain Medication Assessment:  Safety precautions to be maintained throughout the outpatient stay will include: orient to surroundings, keep bed in low position, maintain call bell within reach at all times, provide assistance with transfer out of bed and ambulation.  Medication Inspection Compliance: Pill count conducted under aseptic conditions, in front of the patient. Neither the pills nor the bottle was removed from the patient's sight at any time. Once count was completed pills were immediately returned to the patient in their original bottle.  Medication #1: Buprenorphine  (Suboxone) Pill/Patch Count: 0 of 4 pills/patches remain Pill/Patch Appearance: Markings consistent with prescribed medication Bottle Appearance: Standard pharmacy container. Clearly labeled. Filled Date: 32 / 13 / 2025 Last Medication intake:  03/27/24  Medication #2: Tramadol  (Ultram ) 50mg  Pill/Patch Count: 22 of 60 pills/patches remain Pill/Patch Appearance: Markings consistent with prescribed medication Bottle Appearance: Standard pharmacy container. Clearly labeled. Filled Date: 76 / 14 / 2025 Last Medication intake:  Today  Medication #3: Tramadol  (Ultram ) 100mg  Pill/Patch Count: 11 of 30 pills/patches remain Pill/Patch Appearance: Markings consistent with prescribed medication Bottle Appearance: Standard pharmacy container. Clearly labeled. Filled Date: 38 / 14 / 2025 Last Medication intake:  Today

## 2024-03-30 ENCOUNTER — Other Ambulatory Visit: Payer: Self-pay

## 2024-04-03 ENCOUNTER — Other Ambulatory Visit: Payer: Self-pay

## 2024-04-04 ENCOUNTER — Encounter

## 2024-04-04 DIAGNOSIS — Z9189 Other specified personal risk factors, not elsewhere classified: Secondary | ICD-10-CM

## 2024-04-04 DIAGNOSIS — G478 Other sleep disorders: Secondary | ICD-10-CM

## 2024-04-06 ENCOUNTER — Other Ambulatory Visit: Payer: Self-pay

## 2024-04-13 ENCOUNTER — Ambulatory Visit: Payer: Self-pay | Admitting: Pulmonary Disease

## 2024-04-13 DIAGNOSIS — G4733 Obstructive sleep apnea (adult) (pediatric): Secondary | ICD-10-CM | POA: Diagnosis not present

## 2024-04-14 NOTE — Telephone Encounter (Signed)
 I have notified the patient and placed the order for the CPAP titration study.  Nothing further needed.

## 2024-04-14 NOTE — Telephone Encounter (Signed)
-----   Message from Dedra Sanders sent at 04/13/2024 11:55 AM EST ----- She appears to have severe sleep apnea.  Because she has a combined picture that is central/mixed apnea recommend an in-lab titration study to determine her CPAP needs. ----- Message ----- From: Delma Longs Sent: 04/13/2024  11:13 AM EST To: Dedra LITTIE Sanders, MD

## 2024-04-19 ENCOUNTER — Other Ambulatory Visit: Payer: Self-pay

## 2024-04-25 ENCOUNTER — Encounter: Payer: Self-pay | Admitting: Pulmonary Disease

## 2024-04-25 ENCOUNTER — Ambulatory Visit: Admitting: Pulmonary Disease

## 2024-04-25 VITALS — BP 130/76 | HR 74 | Temp 97.7°F | Ht 62.0 in | Wt 111.0 lb

## 2024-04-25 DIAGNOSIS — K449 Diaphragmatic hernia without obstruction or gangrene: Secondary | ICD-10-CM

## 2024-04-25 DIAGNOSIS — G4733 Obstructive sleep apnea (adult) (pediatric): Secondary | ICD-10-CM

## 2024-04-25 DIAGNOSIS — G4731 Primary central sleep apnea: Secondary | ICD-10-CM

## 2024-04-25 DIAGNOSIS — R0602 Shortness of breath: Secondary | ICD-10-CM

## 2024-04-25 DIAGNOSIS — R5383 Other fatigue: Secondary | ICD-10-CM | POA: Diagnosis not present

## 2024-04-25 DIAGNOSIS — K219 Gastro-esophageal reflux disease without esophagitis: Secondary | ICD-10-CM

## 2024-04-25 DIAGNOSIS — T402X5A Adverse effect of other opioids, initial encounter: Secondary | ICD-10-CM

## 2024-04-25 NOTE — Progress Notes (Unsigned)
 Subjective:    Patient ID: Sheila Newton, female    DOB: [birth year], 83 y.o.   MRN: [id number]  Patient Care Team: Caro Harlene POUR, NP as PCP - General (Geriatric Medicine) Tamea Dedra CROME, MD as Consulting Physician (Pulmonary Disease)  Chief Complaint  Patient presents with   Sleep Apnea    BACKGROUND/INTERVAL:Sheila Newton is an 83 year old lifelong never smoker with history as noted below who presents for follow-up of dyspnea and dizziness that have been present for years. She has had high-resolution chest CT on 05 March 2023 that showed no evidence of fibrotic interstitial lung disease, mild air trapping indicative of small airways disease, mild cylindrical bronchiectasis and possible gastric wall thickening.  She has not had any worsening of her shortness of breath and sighing respirations since she was last seen. Since that visit she has had upper endoscopy.  She did notice marked improvement in shortness of breath with management of gastroesophageal reflux.  She still however has some issues with esophageal dysmotility that are being addressed by GI.   Patient was last seen dyspnea/fatigue on 14 March 2024.  A sleep study was ordered at that time due to issues with frequent napping during the day and memory lapses.  Concern for sleep apnea.  HPI Discussed the use of AI scribe software for clinical note transcription with the patient, who gave verbal consent to proceed.  History of Present Illness   Sheila Newton is an 83 year old female who presents for follow-up on dyspnea and severe sleep apnea.  She presents with her husband Lamar.  She experiences significant dyspnea, which has been persistent and is accompanied by fatigue and tiredness during the day, often by late morning. She notes difficulty breathing and a decline in balance over the past six to eight months, coinciding with a general decline in her condition.  She has been diagnosed with very severe sleep apnea, with a sleep  study showing an apnea-hypopnea index of 65.7 events per hour.  She has mixed central and obstructive apneas as well.  A titration study has been ordered however the patient wanted to discuss these findings first before proceeding.  She experiences dry mouth, particularly after naps, and uses tape on her mouth at night, which she questions might affect her breathing.  She is on a buprenorphine  patch for chronic pain which can of course cause issues with respiratory depression as well as other neuropsychiatric issues. She expresses concern about the size and comfort of CPAP masks and equipment.  She wanted to be considered for an Inspire device however, we discussed that she needs the titration study and also needs to fail CPAP before inspire can be considered.  She was also advised that inspire device would require a surgical procedure.    Patient and husband had multiple questions with regards to sleep apnea and the severity of the patient's sleep apnea.  After lengthy discussion, patient agrees to proceed with the titration study.   Review of Systems A 10 point review of systems was performed and it is as noted above otherwise negative.   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   Chronic radicular lumbar pain 11/26/2020   Lumbar spondylosis 11/26/2020   Lumbar degenerative disc disease 11/26/2020   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   Encounter for long-term opiate analgesic use  11/26/2020   Spinal stenosis, lumbar region, 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/26/2020   Chronic pain syndrome 11/26/2020   Small fiber neuropathy 11/26/2020   Piriformis syndrome of both sides 11/26/2020   Neuroforaminal stenosis of lumbar spine (L5/S1, severe bilateral) 11/26/2020   Compression fracture of T12 vertebra (HCC) 06/22/2019   GERD without esophagitis 06/13/2019   Callus of foot 10/26/2017   Great toe pain, right 10/26/2017   Hypercholesterolemia 07/20/2002   Allergic rhinitis 04/15/2001    Social History   Tobacco Use   Smoking status: Never    Passive exposure: Never   Smokeless tobacco: Never  Substance Use Topics   Alcohol use: Never    Allergies  Allergen Reactions   Sumatriptan Anxiety, Palpitations and Other (See Comments)    Felt weak    Penicillin G Anxiety and Other (See Comments)    Reaction unknown given a lot of it as a child. Was told not to take anymore of it Reaction unknown given a lot of it as a child. Was told not to take anymore of it Reaction unknown given a lot of it as a child. Was told not to take anymore of it Reaction unknown given a lot of it as a child. Was told not to take anymore of it    Topiramate Other (See Comments)   Ilosone [Erythromycin] Other (See Comments)    Current Meds  Medication Sig   atorvastatin (LIPITOR) 20 MG tablet TAKE 1 TABLET BY MOUTH EVERY DAY   buprenorphine  (BUTRANS ) 20 MCG/HR PTWK Place 1 patch onto the skin once a week. For chronic pain syndrome. Ok for pt to fill up to 3 days earlier than fill date.   cetirizine  (ZYRTEC ) 10 MG tablet Take 0.5 tablets (5 mg total) by mouth daily.   Cholecalciferol (VITAMIN D3) 50 MCG (2000 UT) TABS Take one tablet by mouth daily.   Cyanocobalamin (VITAMIN B 12 PO) Take by mouth daily.   estrogen, conjugated,-medroxyprogesterone (PREMPRO ) 0.3-1.5 MG tablet Take 1 tablet by mouth daily.   famotidine (PEPCID) 20 MG tablet Take 20 mg by mouth at bedtime.   fluticasone   (FLONASE ) 50 MCG/ACT nasal spray Place 2 sprays into both nostrils daily.   lubiprostone  (AMITIZA ) 8 MCG capsule TAKE 2 CAPSULES (16 MCG TOTAL) BY MOUTH 2 (TWO) TIMES DAILY WITH A MEAL. K59.03   Magnesium 200 MG CHEW Chew by mouth daily.   metroNIDAZOLE  (METROGEL ) 1 % gel APPLY TO AFFECTED AREA TOPICALLY EVERY DAY   Multiple Vitamins-Minerals (MULTIVITAMIN ADULT) CHEW Chew by mouth daily.   pantoprazole  (PROTONIX ) 40 MG tablet TAKE 1 TABLET BY MOUTH EVERY DAY   psyllium (HYDROCIL/METAMUCIL) 95 % PACK Take 1 packet by mouth daily.   sertraline  (ZOLOFT ) 50 MG tablet Take 1 tablet (50 mg total) by mouth daily.   SODIUM FLUORIDE 5000 PPM 1.1 % GEL dental gel Place onto teeth 3 (three) times daily.   traMADol  (ULTRAM ) 50 MG tablet Take 1 tablet (50 mg total) by mouth 2 (two) times daily as needed for severe pain (pain score 7-10).   traMADol  (ULTRAM -ER) 100 MG 24 hr tablet Take 1 tablet (100 mg total) by mouth at bedtime. Ok for pt to fill  up to 3 days earlier than fill date.   Zoledronic Acid (RECLAST IV) Inject into the vein. Once yearly    Immunization History  Administered Date(s) Administered   Fluad Quad(high Dose 65+) 02/22/2022, 03/16/2023   Influenza-Unspecified 03/27/2019, 03/25/2020   Moderna SARS-COV2 Booster Vaccination 03/01/2021, 10/21/2021   Moderna Sars-Covid-2 Vaccination 06/08/2019, 07/06/2019, 04/09/2020, 10/13/2020   Pneumococcal Conjugate,unspecified 03/08/2001, 04/23/2006   Pneumococcal Conjugate-13 02/07/2013   Pneumococcal Polysaccharide-23 04/23/2006   RSV,unspecified 04/29/2022   Tdap 12/21/2018   Zoster Recombinant(Shingrix) 05/25/2016, 05/25/2017   Zoster, Live 09/02/2005, 08/06/2017        Objective:     Vitals:   04/25/24 1039  BP: 130/76  Pulse: 74  Temp: 97.7 F (36.5 C)  Height: 5' 2 (1.575 m)  Weight: 111 lb (50.3 kg)  SpO2: 98%  TempSrc: Temporal  BMI (Calculated): 20.3     SpO2: 98 %  GENERAL: Thin, well-developed woman, no acute  distress, fully ambulatory.  No conversational dyspnea HEAD: Normocephalic, atraumatic.  EYES: Pupils equal, round, reactive to light.  No scleral icterus.  MOUTH: Dentition intact, oral mucosa moist.  No thrush. NECK: Supple. No thyromegaly. Trachea midline. No JVD.  No adenopathy. PULMONARY: Good air entry bilaterally.  No adventitious sounds. CARDIOVASCULAR: S1 and S2. Regular rate and rhythm.  No rubs, murmurs or gallops heard. ABDOMEN: Benign. MUSCULOSKELETAL: No joint deformity, no clubbing, no edema.  NEUROLOGIC: No overt focal deficits.  Speech is fluent. SKIN: Intact,warm,dry. PSYCH: Mood and behavior normal.        Assessment & Plan:     ICD-10-CM   1. OSA (obstructive sleep apnea) - SEVERE  G47.33    AHI 65.7    2. Fatigue, unspecified type  R53.83     3. Shortness of breath  R06.02     4. Hiatal hernia with GERD  K44.9    K21.9     5. Opioid-induced respiratory depression  R06.89    T40.2X5A    Suspected - pt. 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 Central component due to inadequate brain signals to respiratory centers. Obstructive component likely due to anatomical factors such as small airways. Deviated septum may aggravate but not significantly contribute. Not a candidate for implant due to central component and size constraints. CPAP therapy is the primary treatment option, with newer, smaller masks available for comfort. Humidifier feature in CPAP machines can address dry mouth. - Arranged for titration study to determine appropriate CPAP settings. - Refer to sleep specialist pending results of titration study for management of CPAP therapy and mask fitting.  Dyspnea Potentially related to severe sleep apnea. Symptoms include fatigue and shortness of breath, possibly exacerbated by inadequate sleep due to sleep apnea. No direct link to balance issues, which may be  neurological in nature.  Patient also has been on Butrans  of longstanding, she has been advised that her myriad of symptoms could be related to this.  Patient reluctant to be weaned off of Butrans  due to chronic back pain. - Address sleep apnea as primary intervention to potentially improve dyspnea.      Advised if symptoms do not improve or worsen, to please contact office for sooner follow up or seek emergency care.    I spent 42 minutes of dedicated to the care of this patient on the date of this encounter to include pre-visit review of records, face-to-face time with the patient discussing conditions  above, post visit ordering of testing, clinical documentation with the electronic health record, making appropriate referrals as documented, and communicating necessary findings to members of the patients care team.     C. Leita Sanders, MD Advanced Bronchoscopy PCCM [facility]    *This note was generated using voice recognition software/Dragon and/or AI transcription program.  Despite best efforts to proofread, errors can occur which can change the meaning. Any transcriptional errors that result from this process are unintentional and may not be fully corrected at the time of dictation.

## 2024-04-25 NOTE — Patient Instructions (Signed)
 VISIT SUMMARY:  You came in today for a follow-up on your breathing difficulties and severe sleep apnea. You have been experiencing significant shortness of breath, fatigue, and a decline in balance over the past several months. Your sleep study showed very severe sleep apnea, and you have concerns about the CPAP equipment.  YOUR PLAN:  -SEVERE MIXED CENTRAL AND OBSTRUCTIVE SLEEP APNEA: This condition means that your sleep is interrupted by both central (brain-related) and obstructive (airway-related) issues, causing you to stop breathing frequently during the night. We have arranged for a titration study to determine the appropriate CPAP settings for you, and you will be referred to a sleep specialist for further management and mask fitting. Newer, smaller CPAP masks are available for better comfort, and the humidifier feature can help with your dry mouth.  -DYSPNEA: Dyspnea means difficulty breathing. Your shortness of breath and fatigue may be related to your severe sleep apnea. By addressing your sleep apnea, we hope to see an improvement in your breathing difficulties.  INSTRUCTIONS:  Please follow up with the sleep specialist for your CPAP titration study and mask fitting. Addressing your sleep apnea is the primary intervention to potentially improve your breathing difficulties.

## 2024-04-26 ENCOUNTER — Encounter: Payer: Self-pay | Admitting: Pulmonary Disease

## 2024-04-27 ENCOUNTER — Other Ambulatory Visit: Payer: Self-pay

## 2024-05-01 ENCOUNTER — Other Ambulatory Visit: Payer: Self-pay

## 2024-05-01 ENCOUNTER — Telehealth: Payer: Self-pay

## 2024-05-01 ENCOUNTER — Ambulatory Visit: Attending: Sleep Medicine

## 2024-05-01 DIAGNOSIS — R0683 Snoring: Secondary | ICD-10-CM | POA: Insufficient documentation

## 2024-05-01 DIAGNOSIS — G478 Other sleep disorders: Secondary | ICD-10-CM | POA: Insufficient documentation

## 2024-05-01 DIAGNOSIS — G4733 Obstructive sleep apnea (adult) (pediatric): Secondary | ICD-10-CM

## 2024-05-01 DIAGNOSIS — G4710 Hypersomnia, unspecified: Secondary | ICD-10-CM | POA: Insufficient documentation

## 2024-05-01 DIAGNOSIS — G4730 Sleep apnea, unspecified: Secondary | ICD-10-CM | POA: Diagnosis present

## 2024-05-01 NOTE — Telephone Encounter (Signed)
 Copied from CRM [id number]. Topic: Clinical - Medical Advice >> May 01, 2024  8:31 AM Benton O wrote: Reason for CRM: patient is requesting a call back concerning should she take her meds or usual prescription pills is there something she should not eat before sleep study  Patient doesn't have instructions . Please reach out to patient asap [phone]

## 2024-05-04 ENCOUNTER — Other Ambulatory Visit: Payer: Self-pay

## 2024-05-26 DIAGNOSIS — G4733 Obstructive sleep apnea (adult) (pediatric): Secondary | ICD-10-CM

## 2024-05-29 ENCOUNTER — Ambulatory Visit (INDEPENDENT_AMBULATORY_CARE_PROVIDER_SITE_OTHER): Payer: Self-pay | Admitting: Pulmonary Disease

## 2024-06-01 ENCOUNTER — Ambulatory Visit: Admitting: Internal Medicine

## 2024-06-02 ENCOUNTER — Other Ambulatory Visit: Payer: Self-pay

## 2024-06-06 ENCOUNTER — Encounter: Payer: Self-pay | Admitting: Pulmonary Disease

## 2024-06-06 ENCOUNTER — Ambulatory Visit: Admitting: Pulmonary Disease

## 2024-06-06 VITALS — BP 140/92 | HR 94 | Wt 110.5 lb

## 2024-06-06 DIAGNOSIS — R0609 Other forms of dyspnea: Secondary | ICD-10-CM | POA: Diagnosis not present

## 2024-06-06 DIAGNOSIS — R0602 Shortness of breath: Secondary | ICD-10-CM

## 2024-06-06 DIAGNOSIS — R5383 Other fatigue: Secondary | ICD-10-CM

## 2024-06-06 DIAGNOSIS — G4733 Obstructive sleep apnea (adult) (pediatric): Secondary | ICD-10-CM

## 2024-06-06 NOTE — Progress Notes (Unsigned)
 "  Subjective:    Patient ID: Sheila Newton, female    DOB: Apr 07, 1941, 84 y.o.   MRN: [id number]  Patient Care Team: Caro Harlene POUR, NP as PCP - General (Geriatric Medicine) Tamea Dedra CROME, MD as Consulting Physician (Pulmonary Disease)  Chief Complaint  Patient presents with   Follow-up    SOB/OSA    BACKGROUND/INTERVAL:  HPI   Review of Systems A 10 point review of systems was performed and it is as noted above otherwise negative.   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   Chronic radicular lumbar pain 11/26/2020   Lumbar spondylosis 11/26/2020   Lumbar degenerative disc disease 11/26/2020   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   Encounter for long-term opiate analgesic use 11/26/2020   Spinal stenosis, lumbar region, 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/26/2020   Chronic pain syndrome 11/26/2020   Small fiber neuropathy 11/26/2020   Piriformis syndrome of both sides 11/26/2020   Neuroforaminal stenosis of lumbar spine (L5/S1, severe bilateral) 11/26/2020   Compression fracture of T12 vertebra (HCC) 06/22/2019   GERD without esophagitis 06/13/2019   Callus of foot 10/26/2017   Great toe pain, right 10/26/2017   Hypercholesterolemia 07/20/2002   Allergic rhinitis 04/15/2001    Social History   Tobacco Use   Smoking status: Never    Passive exposure: Never   Smokeless tobacco: Never  Substance Use Topics   Alcohol use: Never     Allergies[1]  Active Medications[2]  Immunization History  Administered Date(s) Administered   Fluad Quad(high Dose 65+) 02/22/2022, 03/16/2023   Influenza-Unspecified 03/27/2019, 03/25/2020   Moderna SARS-COV2 Booster Vaccination 03/01/2021, 10/21/2021   Moderna Sars-Covid-2 Vaccination 06/08/2019, 07/06/2019, 04/09/2020, 10/13/2020   Pneumococcal Conjugate,unspecified 03/08/2001, 04/23/2006   Pneumococcal Conjugate-13 02/07/2013   Pneumococcal Polysaccharide-23 04/23/2006   RSV,unspecified 04/29/2022   Tdap 12/21/2018   Zoster Recombinant(Shingrix) 05/25/2016, 05/25/2017   Zoster, Live 09/02/2005, 08/06/2017        Objective:     Vitals:   06/06/24 1126  BP: (!) 140/92  Pulse: 94  Weight: 110 lb 8 oz (50.1 kg)  SpO2: 98%     SpO2: 98 %  GENERAL: Thin, well-developed woman, no acute distress, fully ambulatory.  No conversational dyspnea HEAD: Normocephalic, atraumatic.  EYES: Pupils equal, round, reactive to light.  No scleral icterus.  MOUTH: Dentition intact, oral mucosa moist.  No thrush. NECK: Supple. No thyromegaly. Trachea midline. No JVD.  No adenopathy. PULMONARY: Good air entry bilaterally.  No adventitious sounds. CARDIOVASCULAR: S1 and S2. Regular rate and rhythm.  No rubs, murmurs or gallops heard. ABDOMEN: Benign. MUSCULOSKELETAL: No joint deformity, no clubbing, no edema.  NEUROLOGIC: No overt focal deficits.  Speech is fluent. SKIN: Intact,warm,dry. PSYCH: Mood and behavior normal.        Assessment & Plan:   No diagnosis found.  No orders of the defined types were placed in this encounter.   No orders of the defined types were placed in this encounter.  BIPAP ST 12/7 backup rate of 14  ResMed AirTouch nasal mask, [employer] [facility]   Advised if symptoms do not improve or worsen, to please contact office for sooner follow up or seek emergency care.    I spent xxx minutes of dedicated to the care of this patient on the date of this  encounter to include pre-visit review of records, face-to-face time with the patient discussing conditions above, post visit ordering of testing, clinical documentation with the electronic health record, making appropriate referrals as documented, and communicating necessary findings to members of the patients care team.     C. Leita Sanders, MD Advanced Bronchoscopy PCCM [facility]    *This note was generated using voice recognition software/Dragon and/or AI transcription program.  Despite best efforts to proofread, errors can occur which can change the meaning. Any transcriptional errors that result from this process are unintentional and may not be fully corrected at the time of dictation.    [1]  Allergies Allergen Reactions   Sumatriptan Anxiety, Palpitations and Other (See Comments)    Felt weak    Penicillin G Anxiety and Other (See Comments)    Reaction unknown given a lot of it as a child. Was told not to take anymore of it Reaction unknown given a lot of it as a child. Was told not to take anymore of it Reaction unknown given a lot of it as a child. Was told not to take anymore of it Reaction unknown given a lot of it as a child. Was told not to take anymore of it    Topiramate Other (See Comments)   Ilosone [Erythromycin] Other (See Comments)  [2]  Current Meds  Medication Sig   atorvastatin (LIPITOR) 20 MG tablet TAKE 1 TABLET BY MOUTH EVERY DAY   buprenorphine  (BUTRANS ) 20 MCG/HR PTWK Place 1 patch onto the skin once a week. For chronic pain syndrome. Ok for pt to fill up to 3 days earlier than fill date.   cetirizine  (ZYRTEC ) 10 MG tablet Take 0.5 tablets (5 mg total) by mouth daily.   Cholecalciferol (VITAMIN D3) 50 MCG (2000 UT) TABS Take one tablet by mouth daily.   Cyanocobalamin (VITAMIN B 12 PO) Take by mouth daily.   estrogen, conjugated,-medroxyprogesterone (PREMPRO ) 0.3-1.5 MG tablet Take 1 tablet by mouth daily.   famotidine (PEPCID) 20  MG tablet Take 20 mg by mouth at bedtime.   fluticasone  (FLONASE ) 50 MCG/ACT nasal spray Place 2 sprays into both nostrils daily.   lubiprostone  (AMITIZA ) 8 MCG capsule TAKE 2 CAPSULES (16 MCG TOTAL) BY MOUTH 2 (TWO) TIMES DAILY WITH A MEAL. K59.03   Magnesium 200 MG CHEW Chew by mouth daily.   metroNIDAZOLE  (METROGEL ) 1 % gel APPLY TO AFFECTED AREA TOPICALLY EVERY DAY   Multiple Vitamins-Minerals (MULTIVITAMIN ADULT) CHEW Chew by mouth daily.   pantoprazole  (PROTONIX ) 40 MG tablet TAKE 1 TABLET BY MOUTH EVERY DAY   psyllium (HYDROCIL/METAMUCIL) 95 % PACK Take 1 packet by mouth daily.   sertraline  (ZOLOFT ) 50 MG tablet Take 1 tablet (50 mg total) by mouth daily.   SODIUM FLUORIDE 5000 PPM 1.1 % GEL dental gel Place onto teeth 3 (three) times daily.   traMADol  (ULTRAM ) 50 MG tablet Take 1 tablet (50 mg total) by mouth 2 (two) times daily as needed for severe pain (pain score 7-10).   traMADol  (ULTRAM -ER) 100 MG 24 hr tablet Take 1 tablet (100 mg total) by mouth at bedtime. Ok for pt to fill up to 3 days earlier than fill date.   Zoledronic Acid (  RECLAST IV) Inject into the vein. Once yearly   "

## 2024-06-06 NOTE — Patient Instructions (Addendum)
 VISIT SUMMARY:  During your visit, we discussed your severe obstructive sleep apnea and the difficulties you are experiencing with using BiPAP, as well as your shortness of breath. We reviewed the importance of finding a comfortable mask fit and the impact of sleep quality on your overall health.  YOUR PLAN:  -OBSTRUCTIVE SLEEP APNEA: Obstructive sleep apnea is a condition where your airway becomes blocked during sleep, causing breathing interruptions. We discussed the importance of high-quality sleep for your overall well-being and muscle recovery. You will try different BiPAP masks to find one that is comfortable for you. We have referred you to Dr. Jess, our sleep specialist, for further management and optimization of your BiPAP therapy. Additionally, ensure that your BiPAP machine includes a heated humidity setting to help with dry mouth.  -DYSPNEA: Dyspnea means shortness of breath. Your intermittent shortness of breath, especially when climbing stairs, is likely related to poor sleep quality and muscle fatigue due to obstructive sleep apnea. Addressing your sleep apnea should help improve your overall respiratory function and reduce your shortness of breath.  INSTRUCTIONS:  Please follow up with Dr. Jess, the sleep specialist, for further management of your BiPAP therapy. Ensure your BiPAP machine includes a heated humidity setting to address dry mouth. Continue to try different BiPAP masks to find one that is comfortable for you.  The order for your BiPAP will be run through your insurance. This whole process can take about 2-3 weeks or so in total to where you'd be fully set up with your device. Please let us  know if you haven't heard from anyone after that time.  We have sent the order for your BiPAP to be fulfilled through a company called [employer], if they are in network with your insurance. Here is Technical Sales Engineer information for future reference:  Their  Toll-Free number: [phone]  If you need to fax them documentation: [phone]  Their local direct number: [phone]  Phone Hours  All times listed are PST.  Monday: 6:00 AM - 5:00 PM  Tuesday: 6:00 AM - 5:00 PM  Wednesday: 6:00 AM - 5:00 PM  Thursday: 6:00 AM - 5:00 PM  Friday: 6:00 AM - 5:00 PM  Saturday and Sunday: Closed  Customer Service: [email or web address]  Billing Inquiries: [email or web address]

## 2024-06-07 ENCOUNTER — Encounter: Payer: Self-pay | Admitting: Pulmonary Disease

## 2024-06-12 ENCOUNTER — Other Ambulatory Visit (HOSPITAL_BASED_OUTPATIENT_CLINIC_OR_DEPARTMENT_OTHER): Payer: Self-pay

## 2024-06-19 ENCOUNTER — Ambulatory Visit: Admitting: Pulmonary Disease

## 2024-06-27 ENCOUNTER — Encounter: Payer: Self-pay | Admitting: Nurse Practitioner

## 2024-06-27 ENCOUNTER — Other Ambulatory Visit: Payer: Self-pay

## 2024-06-27 ENCOUNTER — Ambulatory Visit: Admitting: Nurse Practitioner

## 2024-06-27 VITALS — BP 148/75 | HR 91 | Temp 97.2°F | Resp 14 | Ht 62.0 in | Wt 110.0 lb

## 2024-06-27 DIAGNOSIS — M48062 Spinal stenosis, lumbar region with neurogenic claudication: Secondary | ICD-10-CM

## 2024-06-27 DIAGNOSIS — G8929 Other chronic pain: Secondary | ICD-10-CM

## 2024-06-27 DIAGNOSIS — Z79899 Other long term (current) drug therapy: Secondary | ICD-10-CM | POA: Diagnosis not present

## 2024-06-27 DIAGNOSIS — Z79891 Long term (current) use of opiate analgesic: Secondary | ICD-10-CM | POA: Diagnosis not present

## 2024-06-27 DIAGNOSIS — G588 Other specified mononeuropathies: Secondary | ICD-10-CM

## 2024-06-27 DIAGNOSIS — M461 Sacroiliitis, not elsewhere classified: Secondary | ICD-10-CM

## 2024-06-27 DIAGNOSIS — M5416 Radiculopathy, lumbar region: Secondary | ICD-10-CM | POA: Diagnosis not present

## 2024-06-27 DIAGNOSIS — M47816 Spondylosis without myelopathy or radiculopathy, lumbar region: Secondary | ICD-10-CM

## 2024-06-27 DIAGNOSIS — M533 Sacrococcygeal disorders, not elsewhere classified: Secondary | ICD-10-CM | POA: Diagnosis not present

## 2024-06-27 DIAGNOSIS — G894 Chronic pain syndrome: Secondary | ICD-10-CM

## 2024-06-27 MED ORDER — BUPRENORPHINE 20 MCG/HR TD PTWK
1.0000 | MEDICATED_PATCH | TRANSDERMAL | 2 refills | Status: AC
  Filled 2024-06-27 – 2024-06-30 (×2): qty 4, 28d supply, fill #0

## 2024-06-27 MED ORDER — TRAMADOL HCL ER 100 MG PO TB24
100.0000 mg | ORAL_TABLET | Freq: Every day | ORAL | 2 refills | Status: AC
  Filled 2024-06-27 – 2024-06-30 (×2): qty 30, 30d supply, fill #0

## 2024-06-27 MED ORDER — TRAMADOL HCL 50 MG PO TABS
50.0000 mg | ORAL_TABLET | Freq: Two times a day (BID) | ORAL | 2 refills | Status: AC | PRN
  Filled 2024-06-27 – 2024-06-30 (×2): qty 60, 30d supply, fill #0

## 2024-06-27 NOTE — Patient Instructions (Signed)

## 2024-06-29 ENCOUNTER — Telehealth: Payer: Self-pay | Admitting: Sleep Medicine

## 2024-06-29 NOTE — Telephone Encounter (Signed)
 Copied from CRM #[id number]. Topic: Appointments - Scheduling Inquiry for Clinic >> Jun 29, 2024  9:39 AM Dedra B wrote: Reason for CRM: Patient said her husband received a call about changing an appointment from 07/12/24 to 07/31/24. She wants to know if that call was from this clinic. Please let patient know. Pt needs her CPAP compliance appointment to be scheduled between 3/8 and 09/28/24. Better to combine the new pt sleep appointment with the compliance appointment for patients convenience.

## 2024-06-30 ENCOUNTER — Other Ambulatory Visit: Payer: Self-pay

## 2024-07-12 ENCOUNTER — Ambulatory Visit: Admitting: Sleep Medicine

## 2024-07-31 ENCOUNTER — Ambulatory Visit: Admitting: Sleep Medicine

## 2024-09-18 ENCOUNTER — Encounter: Admitting: Nurse Practitioner

## 2024-11-21 ENCOUNTER — Ambulatory Visit: Payer: Self-pay | Admitting: Nurse Practitioner
